# Patient Record
Sex: Female | Born: 1960 | Race: White | Hispanic: No | Marital: Married | Smoking: Never smoker
Health system: Southern US, Community
[De-identification: ages and names within clinical notes are randomized; demographics above are authoritative.]

## PROBLEM LIST (undated history)

## (undated) DIAGNOSIS — G8929 Other chronic pain: Secondary | ICD-10-CM

## (undated) DIAGNOSIS — I10 Essential (primary) hypertension: Secondary | ICD-10-CM

## (undated) DIAGNOSIS — J3801 Paralysis of vocal cords and larynx, unilateral: Secondary | ICD-10-CM

## (undated) DIAGNOSIS — J45998 Other asthma: Secondary | ICD-10-CM

## (undated) DIAGNOSIS — J302 Other seasonal allergic rhinitis: Secondary | ICD-10-CM

## (undated) DIAGNOSIS — N950 Postmenopausal bleeding: Secondary | ICD-10-CM

## (undated) DIAGNOSIS — J45909 Unspecified asthma, uncomplicated: Secondary | ICD-10-CM

## (undated) DIAGNOSIS — M1990 Unspecified osteoarthritis, unspecified site: Secondary | ICD-10-CM

## (undated) DIAGNOSIS — M542 Cervicalgia: Secondary | ICD-10-CM

## (undated) DIAGNOSIS — T7840XA Allergy, unspecified, initial encounter: Secondary | ICD-10-CM

## (undated) DIAGNOSIS — R002 Palpitations: Secondary | ICD-10-CM

## (undated) DIAGNOSIS — N840 Polyp of corpus uteri: Secondary | ICD-10-CM

## (undated) DIAGNOSIS — N390 Urinary tract infection, site not specified: Secondary | ICD-10-CM

## (undated) DIAGNOSIS — N9489 Other specified conditions associated with female genital organs and menstrual cycle: Secondary | ICD-10-CM

## (undated) HISTORY — PX: TONSILLECTOMY: SUR1361

## (undated) HISTORY — PX: APPENDECTOMY: SHX54

## (undated) HISTORY — DX: Allergy, unspecified, initial encounter: T78.40XA

## (undated) HISTORY — DX: Essential (primary) hypertension: I10

## (undated) HISTORY — PX: PLACEMENT OF BREAST IMPLANTS: SHX6334

## (undated) HISTORY — DX: Unspecified asthma, uncomplicated: J45.909

## (undated) HISTORY — DX: Paralysis of vocal cords and larynx, unilateral: J38.01

## (undated) HISTORY — PX: TONSILLECTOMY AND ADENOIDECTOMY: SUR1326

## (undated) HISTORY — DX: Urinary tract infection, site not specified: N39.0

---

## 1994-03-19 HISTORY — PX: APPENDECTOMY: SHX54

## 2001-03-19 HISTORY — PX: DILATION AND CURETTAGE OF UTERUS: SHX78

## 2007-04-07 DIAGNOSIS — M419 Scoliosis, unspecified: Secondary | ICD-10-CM | POA: Insufficient documentation

## 2007-04-10 DIAGNOSIS — R002 Palpitations: Secondary | ICD-10-CM | POA: Insufficient documentation

## 2007-04-17 DIAGNOSIS — F419 Anxiety disorder, unspecified: Secondary | ICD-10-CM | POA: Insufficient documentation

## 2008-07-06 ENCOUNTER — Encounter: Payer: Self-pay | Admitting: Family Medicine

## 2008-10-21 DIAGNOSIS — R768 Other specified abnormal immunological findings in serum: Secondary | ICD-10-CM | POA: Insufficient documentation

## 2008-10-21 DIAGNOSIS — J45909 Unspecified asthma, uncomplicated: Secondary | ICD-10-CM | POA: Insufficient documentation

## 2008-10-22 ENCOUNTER — Encounter: Payer: Self-pay | Admitting: Family Medicine

## 2009-04-06 ENCOUNTER — Ambulatory Visit: Payer: Self-pay | Admitting: Family Medicine

## 2009-04-06 DIAGNOSIS — J45909 Unspecified asthma, uncomplicated: Secondary | ICD-10-CM | POA: Insufficient documentation

## 2009-04-06 DIAGNOSIS — J4521 Mild intermittent asthma with (acute) exacerbation: Secondary | ICD-10-CM | POA: Insufficient documentation

## 2009-04-06 DIAGNOSIS — I10 Essential (primary) hypertension: Secondary | ICD-10-CM | POA: Insufficient documentation

## 2009-04-13 ENCOUNTER — Telehealth: Payer: Self-pay | Admitting: Family Medicine

## 2009-05-27 ENCOUNTER — Telehealth: Payer: Self-pay | Admitting: Family Medicine

## 2009-06-06 LAB — HM MAMMOGRAPHY: HM Mammogram: NEGATIVE

## 2009-06-24 ENCOUNTER — Ambulatory Visit: Payer: Self-pay | Admitting: Family Medicine

## 2009-06-29 ENCOUNTER — Telehealth: Payer: Self-pay | Admitting: Family Medicine

## 2009-07-15 ENCOUNTER — Telehealth: Payer: Self-pay | Admitting: Family Medicine

## 2009-11-25 DIAGNOSIS — E559 Vitamin D deficiency, unspecified: Secondary | ICD-10-CM | POA: Insufficient documentation

## 2010-04-18 NOTE — Progress Notes (Signed)
Summary: Medication correction to Metoprolol  Phone Note From Pharmacy   [facility]  [place] [address] Call For: Naidelin Gugliotta  Summary of Call: Pharmacy called to request corrected change to pt med.  Pt pt pharmacist, she has been on the Metoprolol 25 XR from Dr Daphine Deutscher, not the regular, she must have not clarified this at initial OV.  Correction made in EMR, permissin given to pharmacist to fill as pt requested, with the extended release, ER Initial call taken by: Sid Falcon LPN,  July 15, 2009 11:40 AM    New/Updated Medications: METOPROLOL SUCCINATE 25 MG XR24H-TAB (METOPROLOL SUCCINATE) 1 and 1/2 tabs daily

## 2010-04-18 NOTE — Progress Notes (Signed)
Summary: Tussionex  Phone Note Call from Patient   Caller: Patient Call For: Evelena Peat MD Summary of Call: Pt calls and states she has had an URI, but is better except for a cough at night.  Dr. Caryl Never gave her Tussionex a while ago and would like to have a Rx [facility]). [phone] Initial call taken by: Lynann Beaver CMA,  May 27, 2009 10:50 AM  Follow-up for Phone Call        may refill Tussionex one tsp by mouth q 12 hours as needed cough #120 ml with no refill. Follow-up by: Evelena Peat MD,  May 27, 2009 11:55 AM    New/Updated Medications: Sandria Senter ER 8-10 MG/5ML LQCR (CHLORPHENIRAMINE-HYDROCODONE) one teaspoon q 12 hours as needed cough. Prescriptions: TUSSIONEX PENNKINETIC ER 8-10 MG/5ML LQCR (CHLORPHENIRAMINE-HYDROCODONE) one teaspoon q 12 hours as needed cough.  #120 ml x 0   Entered by:   Lynann Beaver CMA   Authorized by:   Roderick Pee MD   Signed by:   Lynann Beaver CMA on 05/27/2009   Method used:   Telephoned to ...       [facility]  [place] [address] (retail)       [address]       [place], [place]  [address]       Ph: [phone] or [phone]       Fax: [phone]   RxID:   [id number]

## 2010-04-18 NOTE — Letter (Signed)
Summary: Test Results/[facility]  Test Results/[facility]   Imported By: Maryln Gottron 04/08/2009 13:06:58  _____________________________________________________________________  External Attachment:    Type:   Image     Comment:   External Document

## 2010-04-18 NOTE — Progress Notes (Signed)
Summary: BP lower and pulse rate higher  Phone Note Call from Patient   Caller: Patient Call For: Evelena Peat MD Summary of Call: Pt started Benicar 20 mg. 1/2 daily 5 days ago.  BP is down to 89/68, and her pulse rate has been as high as 111.  She feels more fatigued.  Is drinking lots of liquids, but concerned because her pulse rate on Metoprolol ran in  the 50s. [phone] Initial call taken by: Lynann Beaver CMA,  April 13, 2009 10:46 AM  Follow-up for Phone Call        spoke with pt.  She had low BP and some dizziness with low dose Benicar of 10 mg once daily.  Also, high pulse up to 110 off low dose metoprolol (off for 5 days)  Pt will stop Benicar and start back metoprolol 25 mg once daily and titirate to 1 and 1/2 tabs in 2-3 days and touch base in 2 weeks if BP still > 140/90 Follow-up by: Evelena Peat MD,  April 13, 2009 12:16 PM

## 2010-04-18 NOTE — Progress Notes (Signed)
Summary: med questions  Phone Note Call from Patient   Caller: Patient Call For: Evelena Peat MD Summary of Call: Pharmacy request to change Metoprolol from [facility] to the regular Metoprolol Tartrate,1 and 1/2 tab daily.  This is what she was previousely on per pt pharmacy.  I also spoke with pt whe is questioning if she should take th Prednisone for a lingering cough?  Her asthma symptoms are gone.  Will the prednisone help with cough or will she most likely have it until the pollen calms down. Initial call taken by: Sid Falcon LPN,  June 29, 2009 12:35 PM  Follow-up for Phone Call        I think she will be OK to change to Metoprolol tartrate.  Would try to avoid prednisone if no signif wheezing, given her prior intolerance. Follow-up by: Evelena Peat MD,  June 29, 2009 1:00 PM  Additional Follow-up for Phone Call Additional follow up Details #1::        Pt informed Additional Follow-up by: Sid Falcon LPN,  June 29, 2009 1:42 PM

## 2010-04-18 NOTE — Assessment & Plan Note (Signed)
Summary: ? asthma-wants shot//ccm   Vital Signs:  Patient profile:   50 year old female Menstrual status:  regular Temp:     98.6 degrees F oral BP sitting:   120 / 72  (left arm) Cuff size:   regular  Vitals Entered By: Sid Falcon LPN (June 24, 1608 3:41 PM) CC: Asthma   History of Present Illness: Patient has history of asthma. Pt relates onset over week ago of increased cough especially early morning. Increased nasal congestion. Wheezing off and on. History of asthma treated with Xopenex, Alvesco, and Singulair (sees allergist).  denies fever. Has responded to corticosteroid injection in  past. Requesting the same today. Nonsmoker.  Allergies: 1)  Ferrous Sulfate 2)  Penicillin V Potassium (Penicillin V Potassium)  Past History:  Past Medical History: Last updated: 04/06/2009 Asthma Hypertension Hay fever, allergies UTI  Past Surgical History: Last updated: 04/06/2009 Appendectomy 1996 Tonsillectomy 1972 PMH reviewed for relevance  Review of Systems       The patient complains of dyspnea on exertion.  The patient denies fever, peripheral edema, prolonged cough, headaches, and hemoptysis.    Physical Exam  General:  Well-developed,well-nourished,in no acute distress; alert,appropriate and cooperative throughout examination Ears:  External ear exam shows no significant lesions or deformities.  Otoscopic examination reveals clear canals, tympanic membranes are intact bilaterally without bulging, retraction, inflammation or discharge. Hearing is grossly normal bilaterally. Mouth:  Oral mucosa and oropharynx without lesions or exudates.  Teeth in good repair. Neck:  No deformities, masses, or tenderness noted. Lungs:  minimal wheezing. No retractions. No rales Heart:  Normal rate and regular rhythm. S1 and S2 normal without gallop, murmur, click, rub or other extra sounds.   Impression & Recommendations:  Problem # 1:  ASTHMA (ICD-493.90) cont usual meds and  depomedrol given.  Oral pred taper written to start if no better in a few days. Depo-Medrol 80 mg. Continue usual medications. Her updated medication list for this problem includes:    Xopenex Hfa 45 Mcg/act Aero (Levalbuterol tartrate) .Marland Kitchen... As needed    Alvesco 80 Mcg/act Aers (Ciclesonide) ..... One puff in am, one puff before bed    Singulair 10 Mg Tabs (Montelukast sodium) ..... One daily    Prednisone 10 Mg Tabs (Prednisone) .Marland Kitchen... Taper as follows: 4-4-4-3-3-2-2-1  Orders: Depo- Medrol 80mg  (J1040) Admin of Therapeutic Inj  intramuscular or subcutaneous (96045)  Problem # 2:  HYPERTENSION (ICD-401.9)  Her updated medication list for this problem includes:    Metoprolol Tartrate 25 Mg Tabs (Metoprolol tartrate) ..... One and 1/2 tabs daily  Complete Medication List: 1)  Alprazolam 0.5 Mg Tabs (Alprazolam) .... As needed 2)  Metoprolol Tartrate 25 Mg Tabs (Metoprolol tartrate) .... One and 1/2 tabs daily 3)  Tussionex Pennkinetic Er 8-10 Mg/63ml Lqcr (Chlorpheniramine-hydrocodone) .... One teaspoon q 12 hours as needed cough. 4)  Xopenex Hfa 45 Mcg/act Aero (Levalbuterol tartrate) .... As needed 5)  Alvesco 80 Mcg/act Aers (Ciclesonide) .... One puff in am, one puff before bed 6)  Singulair 10 Mg Tabs (Montelukast sodium) .... One daily 7)  Claritin 10 Mg Tabs (Loratadine) .... Once daily 8)  Prednisone 10 Mg Tabs (Prednisone) .... Taper as follows: 4-4-4-3-3-2-2-1  Patient Instructions: 1)  Followup promptly if you develop any fever or worsening respiratory symptoms Prescriptions: PREDNISONE 10 MG TABS (PREDNISONE) taper as follows: 4-4-4-3-3-2-2-1  #23 x 0   Entered and Authorized by:   Evelena Peat MD   Signed by:   Evelena Peat MD on  06/24/2009   Method used:   Print then Give to Patient   RxID:   [id number] METOPROLOL TARTRATE 25 MG TABS (METOPROLOL TARTRATE) one and 1/2 tabs daily  #135 x 3   Entered and Authorized by:   Evelena Peat MD   Signed by:    Evelena Peat MD on 06/24/2009   Method used:   [facility]        [facility]  [place] [address] [facility]       [address] [place] [address]       [place], [place]  [address]       Ph: [phone] or [phone]       Fax: [phone]   RxID:   [id number]    Medication Administration  Injection # 1:    Medication: Depo- Medrol 80mg     Diagnosis: ASTHMA (ICD-493.90)    Route: IM    Site: RUOQ gluteus    Exp Date: 01/28/2012    Lot #: [id number]    Mfr: Pharmacia    Patient tolerated injection without complications    Given by: Sid Falcon LPN (June 24, 7508 4:33 PM)  Orders Added: 1)  Depo- Medrol 80mg  [J1040] 2)  Admin of Therapeutic Inj  intramuscular or subcutaneous [96372] 3)  Est. Patient Level III [25852]

## 2010-04-18 NOTE — Assessment & Plan Note (Signed)
Summary: NEW PT EST (TXFR FROM [facility]) / HTN ISSUES // RS   Vital Signs:  Patient profile:   50 year old female Menstrual status:  regular LMP:     04/06/2009 Height:      64.75 inches Weight:      143 pounds BMI:     24.07 Temp:     98.6 degrees F oral Pulse rate:   60 / minute Pulse rhythm:   regular Resp:     12 per minute BP sitting:   130 / 90  (left arm) Cuff size:   regular  Vitals Entered By: Sid Falcon LPN (April 06, 2009 1:58 PM)  Serial Vital Signs/Assessments:  Time      Position  BP       Pulse  Resp  Temp     By                     140/80                         Evelena Peat MD  CC: New pt to establish, concerned about BP Is Patient Diabetic? No LMP (date): 04/06/2009     Menstrual Status regular Enter LMP: 04/06/2009 Last PAP Result normal   History of Present Illness: New patient to establish care. Patient has history of hypertension currently treated with low-dose metoprolol 25 mg daily. Also relates history of what sounds like mild intermittent asthma. She's had some seasonal allergies. Extensive allergy testing in past. Currently off all medications with no recent flareups.  blood pressure treated with beta blocker. Poor control at times. Occasional home readings as high as 170. Frequent systolic readings 140s and 90s diastolic.  Exercises fairly regularly.   Prior history of appendectomy and tonsillectomy. No other surgeries. Family history significant for hypertension several members.  Patient is married. [name] a Veterinary surgeon. 43-year-old autistic son. Drinks 2-3 glasses of wine per day. Nonsmoker.   Preventive Screening-Counseling & Management  Alcohol-Tobacco     Smoking Status: never  Caffeine-Diet-Exercise     Does Patient Exercise: yes  Allergies (verified): 1)  Ferrous Sulfate (Ferrous Sulfate) 2)  Penicillin V Potassium (Penicillin V Potassium)  Past History:  Family History: Last updated: 04/06/2009 Family History Hypertension,  parents  Social History: Last updated: 04/06/2009 Occupation:  Realtor Married Never Smoked Alcohol use-yes Regular exercise-yes  Risk Factors: Exercise: yes (04/06/2009)  Risk Factors: Smoking Status: never (04/06/2009)  Past Medical History: Asthma Hypertension Hay fever, allergies UTI  Past Surgical History: Appendectomy 1996 Tonsillectomy 1972  Family History: Family History Hypertension, parents  Social History: Occupation:  Veterinary surgeon Married Never Smoked Alcohol use-yes Regular exercise-yes Smoking Status:  never Occupation:  employed Does Patient Exercise:  yes  Review of Systems  The patient denies anorexia, fever, weight loss, weight gain, chest pain, syncope, dyspnea on exertion, peripheral edema, prolonged cough, headaches, hemoptysis, abdominal pain, melena, hematochezia, and severe indigestion/heartburn.    Physical Exam  General:  Well-developed,well-nourished,in no acute distress; alert,appropriate and cooperative throughout examination Neck:  No deformities, masses, or tenderness noted. Lungs:  Normal respiratory effort, chest expands symmetrically. Lungs are clear to auscultation, no crackles or wheezes. Heart:  Normal rate and regular rhythm. S1 and S2 normal without gallop, murmur, click, rub or other extra sounds. Pulses:  R radial normal and L radial normal.   Extremities:  no edema Skin:  Intact without suspicious lesions or rashes Cervical Nodes:  No lymphadenopathy noted Psych:  normally interactive, good eye contact, not anxious appearing, and not depressed appearing.     Impression & Recommendations:  Problem # 1:  HYPERTENSION (ICD-401.9) Change in therapy. given her history of asthma stop metoprolol and start Benicar 20 mg one half tablet daily  Her updated medication list for this problem includes:    Benicar 20 Mg Tabs (Olmesartan medoxomil) ..... One half by mouth once daily  Problem # 2:  ASTHMA (ICD-493.90) mild and  intermittent by history.    Complete Medication List: 1)  Alprazolam 0.5 Mg Tabs (Alprazolam) .... As needed 2)  Benicar 20 Mg Tabs (Olmesartan medoxomil) .... One half by mouth once daily  Patient Instructions: 1)  Continue regular aerobic exercise. 2)  Limit wine to no more than 12 ounces per day. 3)  stop metoprolol. 4)  Start Benicar 20 mg one half tablet daily. 5)  Please schedule a follow-up appointment in 1 month.   Preventive Care Screening  Mammogram:    Date:  01/17/2009    Results:  normal   Pap Smear:    Date:  10/17/2008    Results:  normal

## 2010-08-10 ENCOUNTER — Other Ambulatory Visit: Payer: Self-pay | Admitting: Family Medicine

## 2010-11-10 ENCOUNTER — Other Ambulatory Visit: Payer: Self-pay | Admitting: Family Medicine

## 2010-12-15 ENCOUNTER — Other Ambulatory Visit: Payer: Self-pay | Admitting: Family Medicine

## 2011-01-23 ENCOUNTER — Other Ambulatory Visit: Payer: Self-pay | Admitting: Family Medicine

## 2011-03-02 ENCOUNTER — Other Ambulatory Visit (INDEPENDENT_AMBULATORY_CARE_PROVIDER_SITE_OTHER): Payer: BC Managed Care – PPO

## 2011-03-02 DIAGNOSIS — Z0000 Encounter for general adult medical examination without abnormal findings: Secondary | ICD-10-CM

## 2011-03-02 LAB — LIPID PANEL
Cholesterol: 208 mg/dL — ABNORMAL HIGH (ref 0–200)
HDL: 101.9 mg/dL (ref >39.00)
Total CHOL/HDL Ratio: 2
Triglycerides: 91 mg/dL (ref 0.0–149.0)
VLDL: 18.2 mg/dL (ref 0.0–40.0)

## 2011-03-02 LAB — CBC WITH DIFFERENTIAL/PLATELET
Basophils Absolute: 0 10*3/uL (ref 0.0–0.1)
Eosinophils Relative: 3.3 % (ref 0.0–5.0)
MCV: 94.6 fl (ref 78.0–100.0)
Monocytes Absolute: 0.5 10*3/uL (ref 0.1–1.0)
Neutrophils Relative %: 68.9 % (ref 43.0–77.0)
Platelets: 255 10*3/uL (ref 150.0–400.0)
RDW: 13 % (ref 11.5–14.6)
WBC: 7 10*3/uL (ref 4.5–10.5)

## 2011-03-02 LAB — HEPATIC FUNCTION PANEL
ALT: 16 U/L (ref 0–35)
AST: 25 U/L (ref 0–37)
Albumin: 4.3 g/dL (ref 3.5–5.2)
Alkaline Phosphatase: 46 U/L (ref 39–117)
Bilirubin, Direct: 0 mg/dL (ref 0.0–0.3)
Total Bilirubin: 0.6 mg/dL (ref 0.3–1.2)
Total Protein: 7.4 g/dL (ref 6.0–8.3)

## 2011-03-02 LAB — POCT URINALYSIS DIPSTICK
Bilirubin, UA: NEGATIVE
Blood, UA: NEGATIVE
Nitrite, UA: NEGATIVE
Spec Grav, UA: 1.015
pH, UA: 6.5

## 2011-03-02 LAB — BASIC METABOLIC PANEL
BUN: 15 mg/dL (ref 6–23)
Creatinine, Ser: 0.9 mg/dL (ref 0.4–1.2)
GFR: 68.28 mL/min (ref >60.00)
Glucose, Bld: 84 mg/dL (ref 70–99)

## 2011-03-02 LAB — LDL CHOLESTEROL, DIRECT: Direct LDL: 87.1 mg/dL

## 2011-03-09 ENCOUNTER — Encounter: Payer: Self-pay | Admitting: Family Medicine

## 2011-03-09 ENCOUNTER — Ambulatory Visit (INDEPENDENT_AMBULATORY_CARE_PROVIDER_SITE_OTHER): Payer: BC Managed Care – PPO | Admitting: Family Medicine

## 2011-03-09 VITALS — BP 120/78 | HR 72 | Temp 98.5°F | Resp 12 | Ht 65.0 in | Wt 147.0 lb

## 2011-03-09 DIAGNOSIS — I10 Essential (primary) hypertension: Secondary | ICD-10-CM

## 2011-03-09 DIAGNOSIS — Z0000 Encounter for general adult medical examination without abnormal findings: Secondary | ICD-10-CM

## 2011-03-09 DIAGNOSIS — R062 Wheezing: Secondary | ICD-10-CM

## 2011-03-09 MED ORDER — METOPROLOL SUCCINATE ER 25 MG PO TB24: 25.0000 mg | ORAL_TABLET | Freq: Every day | ORAL | Status: DC

## 2011-03-09 MED ORDER — MOMETASONE FUROATE 50 MCG/ACT NA SUSP: 2.0000 | Freq: Every day | NASAL | Status: DC

## 2011-03-09 MED ORDER — AZITHROMYCIN 250 MG PO TABS: ORAL_TABLET | ORAL | Status: AC

## 2011-03-09 MED ORDER — ALPRAZOLAM 0.5 MG PO TABS: 0.5000 mg | ORAL_TABLET | Freq: Three times a day (TID) | ORAL | Status: DC | PRN

## 2011-03-09 MED ORDER — METHYLPREDNISOLONE ACETATE 80 MG/ML IJ SUSP
80.0000 mg | Freq: Once | INTRAMUSCULAR | Status: AC
  Administered 2011-03-09: 80 mg via INTRAMUSCULAR

## 2011-03-09 NOTE — Progress Notes (Signed)
  Subjective:    Patient ID: Lori Love, female    DOB: January 18, 1961, 50 y.o.   MRN: [id number]  HPI  Patient for complete physical. She sees gynecologist regularly. Recent mammogram normal. Recent Pap smears normal. She exercises regularly. She has history of perennial allergies and also asthma which appears to be more mild intermittent. Recent flareup with Christmas tree. Frequent cough and wheeze over the past couple of weeks. Previously has received Depo-Medrol injection which helped. Requesting the same.  No fever or chills.  She has Singulair and Alvesco but does not take regularly.  Hypertension treated with metoprolol 25 mg daily which works well. Strong family history of hypertension both parents.  Past Medical History  Diagnosis Date  . HYPERTENSION 04/06/2009  . ASTHMA 04/06/2009  . Allergy   . UTI (urinary tract infection)    Past Surgical History  Procedure Date  . Appendectomy   . Tonsillectomy     reports that she has never smoked. She does not have any smokeless tobacco history on file. Her alcohol and drug histories not on file. family history includes Hypertension in her father and mother. Allergies  Allergen Reactions  . Ferrous Sulfate     REACTION: hives  . Penicillins     REACTION: hives      Review of Systems  Constitutional: Negative for fever, activity change, appetite change, fatigue and unexpected weight change.  HENT: Negative for hearing loss, ear pain, sore throat and trouble swallowing.   Eyes: Negative for visual disturbance.  Respiratory: Positive for cough and wheezing. Negative for shortness of breath.   Cardiovascular: Negative for chest pain and palpitations.  Gastrointestinal: Negative for abdominal pain, diarrhea, constipation and blood in stool.  Genitourinary: Negative for dysuria and hematuria.  Musculoskeletal: Negative for myalgias, back pain and arthralgias.  Skin: Negative for rash.  Neurological: Negative for dizziness, syncope  and headaches.  Hematological: Negative for adenopathy.  Psychiatric/Behavioral: Negative for confusion and dysphoric mood.       Objective:   Physical Exam  Constitutional: [name] oriented to person, place, and time. She appears well-developed and well-nourished.  HENT:  Head: Normocephalic and atraumatic.  Eyes: EOM are normal. Pupils are equal, round, and reactive to light.  Neck: Normal range of motion. Neck supple. No thyromegaly present.  Cardiovascular: Normal rate, regular rhythm and normal heart sounds.   No murmur heard. Pulmonary/Chest: Breath sounds normal. No respiratory distress. She has no wheezes. She has no rales.  Abdominal: Soft. Bowel sounds are normal. She exhibits no distension and no mass. There is no tenderness. There is no rebound and no guarding.  Musculoskeletal: Normal range of motion. She exhibits no edema.  Lymphadenopathy:    She has no cervical adenopathy.  Neurological: [name] alert and oriented to person, place, and time. She displays normal reflexes. No cranial nerve deficit.  Skin: No rash noted.  Psychiatric: She has a normal mood and affect. Her behavior is normal. Judgment and thought content normal.          Assessment & Plan:  #1 health maintenance. Continue GYN followup. Patient declines flu vaccine. Continue regular exercise. Labs reviewed with patient and all favorable. #2 history of asthma. Current exacerbation probably related to exposure to Christmas tree. Depo-Medrol 80 mg IM given and get back on her steroid inhaler for the next couple of weeks at least #3 hypertension stable refill medication for one year

## 2011-06-28 ENCOUNTER — Ambulatory Visit (INDEPENDENT_AMBULATORY_CARE_PROVIDER_SITE_OTHER): Payer: BC Managed Care – PPO | Admitting: Family Medicine

## 2011-06-28 ENCOUNTER — Encounter: Payer: Self-pay | Admitting: Family Medicine

## 2011-06-28 DIAGNOSIS — D179 Benign lipomatous neoplasm, unspecified: Secondary | ICD-10-CM

## 2011-06-28 NOTE — Progress Notes (Signed)
  Subjective:    Patient ID: Lori Love, female    DOB: Aug 12, 1960, 51 y.o.   MRN: [id number]  HPI  Nonpainful lump on back.  Just noted recently. Location is right upper thoracic region. Not noted any skin changes. No appetite or weight changes. No adenopathy. This was noted incidentally by friend. No adenopathy.   Review of Systems  Constitutional: Negative for appetite change and unexpected weight change.  Hematological: Negative for adenopathy.       Objective:   Physical Exam  Constitutional: She appears well-developed and well-nourished.  Cardiovascular: Normal rate and regular rhythm.   Pulmonary/Chest: Effort normal and breath sounds normal. No respiratory distress. She has no wheezes. She has no rales.  Skin:       Lipomatous mass about one and1/2 cm diameter right upper thoracic region. Nontender. No skin changes          Assessment & Plan:  Benign lipoma right upper back. Reassurance given

## 2011-06-28 NOTE — Patient Instructions (Signed)
Lipoma A lipoma is a noncancerous (benign) tumor composed of fat cells. They are usually found under the skin (subcutaneous). A lipoma may occur in any tissue of the body that contains fat. Common areas for lipomas to appear include the back, shoulders, buttocks, and thighs. Lipomas are a very common soft tissue growth. They are soft and grow slowly. Most problems caused by a lipoma depend on where it is growing. DIAGNOSIS  A lipoma can be diagnosed with a physical exam. These tumors rarely become cancerous, but radiographic studies can help determine this for certain. Studies used may include:  Computerized X-ray scans (CT or CAT scan).   Computerized magnetic scans (MRI).  TREATMENT  Small lipomas that are not causing problems may be watched. If a lipoma continues to enlarge or causes problems, removal is often the best treatment. Lipomas can also be removed to improve appearance. Surgery is done to remove the fatty cells and the surrounding capsule. Most often, this is done with medicine that numbs the area (local anesthetic). The removed tissue is examined under a microscope to make sure it is not cancerous. Keep all follow-up appointments with your caregiver. SEEK MEDICAL CARE IF:   The lipoma becomes larger or hard.   The lipoma becomes painful, red, or increasingly swollen. These could be signs of infection or a more serious condition.  Document Released: 02/23/2002 Document Revised: 02/22/2011 Document Reviewed: 08/05/2009 Exit[employer] Patient Information 2012 [employer]. 

## 2011-07-04 ENCOUNTER — Other Ambulatory Visit: Payer: Self-pay | Admitting: *Deleted

## 2011-07-04 MED ORDER — MONTELUKAST SODIUM 10 MG PO TABS: 10.0000 mg | ORAL_TABLET | Freq: Every day | ORAL | Status: DC

## 2011-10-17 ENCOUNTER — Telehealth: Payer: Self-pay | Admitting: Family Medicine

## 2011-10-17 MED ORDER — NITROFURANTOIN MONOHYD MACRO 100 MG PO CAPS: 100.0000 mg | ORAL_CAPSULE | Freq: Two times a day (BID) | ORAL | Status: AC

## 2011-10-17 NOTE — Telephone Encounter (Signed)
Caller: Lori Love/Patient; PCP: Evelena Peat; CB#: [phone]; ; ; Call regarding Urinary Pain;  Lori Love developed urinary pain, urgency and frequency on 10/16/11. Afebrile. Cloudy urine, denies blood.  Denies flank or low back pain. Lori Love is requesting an abx to be called in because her son is having surgery on his hands today and she cannot come to the office.  Informed abx's cannot be called in and she will need to be seen in the office.  Utilized Urinary Symptoms Guideline.  See PCP within 24 hrs d/t "has one or more urinary tract symptoms and has not been previously evaluated".  Lori Love insists that MD be asked about [facility] [phone] d/t current situation with her son having surgery today.  Please f/u with Lori Love.

## 2011-10-17 NOTE — Telephone Encounter (Signed)
Caller: Lori Love/Patient; Phone Number: [phone]; Message from caller: Calling for status of medication for UTI to be called in. Pt has son going in for surgery today and would like to know when to pick up medication.

## 2011-10-17 NOTE — Telephone Encounter (Signed)
Generally, should be seen but given circumstances, Macrobid one bid for 3 days and needs office f/u if no prompt improvement.

## 2011-10-17 NOTE — Telephone Encounter (Signed)
Please advise 

## 2011-10-17 NOTE — Telephone Encounter (Signed)
Rx sent, pt informed. 

## 2011-11-05 ENCOUNTER — Other Ambulatory Visit (INDEPENDENT_AMBULATORY_CARE_PROVIDER_SITE_OTHER): Payer: BC Managed Care – PPO

## 2011-11-05 DIAGNOSIS — N390 Urinary tract infection, site not specified: Secondary | ICD-10-CM

## 2011-11-05 LAB — POCT URINALYSIS DIPSTICK
Bilirubin, UA: NEGATIVE
Glucose, UA: NEGATIVE
Ketones, UA: NEGATIVE
Leukocytes, UA: NEGATIVE

## 2011-11-22 NOTE — Progress Notes (Signed)
Quick Note:  Nana and Gwen, please help [place] figure out who ordered the UA. Thanks, Johnpaul Gillentine ______

## 2012-01-04 ENCOUNTER — Other Ambulatory Visit: Payer: Self-pay | Admitting: *Deleted

## 2012-01-04 MED ORDER — ALPRAZOLAM 0.5 MG PO TABS: ORAL_TABLET | ORAL | Status: DC

## 2012-01-21 ENCOUNTER — Ambulatory Visit (INDEPENDENT_AMBULATORY_CARE_PROVIDER_SITE_OTHER): Payer: BC Managed Care – PPO | Admitting: Family Medicine

## 2012-01-21 ENCOUNTER — Encounter: Payer: Self-pay | Admitting: Family Medicine

## 2012-01-21 VITALS — BP 112/80 | HR 63 | Temp 98.3°F | Wt 150.0 lb

## 2012-01-21 DIAGNOSIS — J309 Allergic rhinitis, unspecified: Secondary | ICD-10-CM

## 2012-01-21 DIAGNOSIS — J45909 Unspecified asthma, uncomplicated: Secondary | ICD-10-CM

## 2012-01-21 MED ORDER — CICLESONIDE 80 MCG/ACT IN AERS: 1.0000 | INHALATION_SPRAY | Freq: Two times a day (BID) | RESPIRATORY_TRACT | Status: DC

## 2012-01-21 MED ORDER — MOMETASONE FUROATE 50 MCG/ACT NA SUSP: 2.0000 | Freq: Every day | NASAL | Status: DC

## 2012-01-21 NOTE — Progress Notes (Signed)
Chief Complaint  Patient presents with  . Asthma    HPI:  Acute visit for Asthma: -meds include singulair, nasonex, xopenex prn on med list - but she is not taking singulair or nasonex - she is taking zyrtec -symptoms: PND and drainag ein throat is bad, since yesterday has had cough - triggered by being around smoking, cough last night and xopenex didn't help last night, but has needed albuterol 4-5x per week for last several weeks for cough and drainage -uses albuterol about 1x per month normally -reports has use alvesco in the past when had these symptoms and want this medication prescribed today -last prednisone or hospitalization: years ago -last PFT: last year  ROS: See pertinent positives and negatives per HPI.  Past Medical History  Diagnosis Date  . HYPERTENSION 04/06/2009  . ASTHMA 04/06/2009  . Allergy   . UTI (urinary tract infection)     Family History  Problem Relation Age of Onset  . Hypertension Mother   . Hypertension Father     History   Social History  . Marital Status: Married    Spouse Name: N/A    Number of Children: N/A  . Years of Education: N/A   Social History Main Topics  . Smoking status: Never Smoker   . Smokeless tobacco: None  . Alcohol Use: None  . Drug Use: None  . Sexually Active: None   Other Topics Concern  . None   Social History Narrative  . None    Current outpatient prescriptions:ALPRAZolam (XANAX) 0.5 MG tablet, 1/2 tab as needed for sleep, Disp: 30 tablet, Rfl: 1;  levalbuterol (XOPENEX HFA) 45 MCG/ACT inhaler, Inhale 1-2 puffs into the lungs every 4 (four) hours as needed.  , Disp: , Rfl: ;  metoprolol succinate (TOPROL-XL) 25 MG 24 hr tablet, Take 1 tablet (25 mg total) by mouth daily., Disp: 90 tablet, Rfl: 3 montelukast (SINGULAIR) 10 MG tablet, Take 1 tablet (10 mg total) by mouth at bedtime., Disp: 30 tablet, Rfl: 5;  valACYclovir (VALTREX) 500 MG tablet, As needed, Disp: , Rfl: ;  ciclesonide (ALVESCO) 80 MCG/ACT  inhaler, Inhale 1 puff into the lungs 2 (two) times daily., Disp: 1 Inhaler, Rfl: 0;  mometasone (NASONEX) 50 MCG/ACT nasal spray, Place 2 sprays into the nose daily., Disp: 17 g, Rfl: 3 [DISCONTINUED] mometasone (NASONEX) 50 MCG/ACT nasal spray, Place 2 sprays into the nose daily., Disp: 17 g, Rfl: 12  EXAM:  Filed Vitals:   01/21/12 1348  BP: 112/80  Pulse: 63  Temp: 98.3 F (36.8 C)    There is no height on file to calculate BMI.  GENERAL: vitals reviewed and listed above, alert, oriented, appears well hydrated and in no acute distress  HEENT: atraumatic, conjunttiva clear, no obvious abnormalities on inspection of external nose and ears. normal appearance of ear canals and TMs, clear nasal congestion, mild post oropharyngeal erythema with PND, no tonsillar edema or exudate, no sinus TTP  NECK: no obvious masses on inspection  LUNGS: clear to auscultation bilaterally, no wheezes, rales or rhonchi, good air movement  CV: HRRR, no peripheral edema  MS: moves all extremities without noticeable abnormality  PSYCH: pleasant and cooperative, no obvious depression or anxiety  ASSESSMENT AND PLAN:  Discussed the following assessment and plan:  1. Allergic rhinitis  mometasone (NASONEX) 50 MCG/ACT nasal spray  2. ASTHMA  ciclesonide (ALVESCO) 80 MCG/ACT inhaler   -symptoms may more likely be related to uncontrolled AR with PND per exam and advised dialy allegro,  zyrtec or claritin with nasonex  -will do low dose ICS short term as well given albuterol use over last several weeks -pt to follow up with PCP in one month or sooner if worsening or not improving  There are no Patient Instructions on file for this visit.   Kriste Basque R.

## 2012-01-21 NOTE — Patient Instructions (Addendum)
-  uses nasonex every day  -use alvesco daily as instructed until follow up with your doctor, use your xopenex as needed  -follow up with your doctor in 1 month or sooner if symptoms worsen or do not improve

## 2012-01-23 ENCOUNTER — Telehealth: Payer: Self-pay | Admitting: Family Medicine

## 2012-01-23 NOTE — Telephone Encounter (Signed)
Pt called req mometasone (NASONEX) 50 MCG/ACT nasal spray. Pt was told by [facility] that pts insurance is needing confirmation from pcp in order to get this med. Pls call asap. Pt needs this med for asthma.

## 2012-01-23 NOTE — Telephone Encounter (Signed)
Spoke with [place] and she just received the prior authorization.  Called and left a detailed message on pt's personalized voicemail that piror authorization has been received is currently being worked on.

## 2012-01-23 NOTE — Telephone Encounter (Signed)
Lori Love,  I spoke with the pt regarding her Nasonex prior auth. I have submitted it, but I wanted to have this in her chart:  She tried Flonase (gave her nosebleeds), Veramyst, and Nasacort (both of these hurt and "tore up" the membranes inside her nostrils.  Also, she states she needs a refill or [facility] [address] on the levalbuterol [facility] HFA) 45 MCG/ACT inhaler She couldn't remember if she told Dr. Selena Batten that. Thanks!

## 2012-01-24 MED ORDER — LEVALBUTEROL TARTRATE 45 MCG/ACT IN AERO: 1.0000 | INHALATION_SPRAY | RESPIRATORY_TRACT | Status: DC | PRN

## 2012-01-24 NOTE — Telephone Encounter (Signed)
Per Dr. Selena Batten ok to refill once; if pt is having to use this more often pt needs to follow up with Dr. Caryl Never.

## 2012-01-24 NOTE — Telephone Encounter (Signed)
Rx sent to pharmacy and pt is aware of Dr. Elmyra Ricks recommendations.

## 2012-01-24 NOTE — Telephone Encounter (Signed)
Nasonex approved. Approval sent to pharmacy

## 2012-01-24 NOTE — Telephone Encounter (Signed)
See duplicate note. 

## 2012-03-14 ENCOUNTER — Other Ambulatory Visit (INDEPENDENT_AMBULATORY_CARE_PROVIDER_SITE_OTHER): Payer: BC Managed Care – PPO

## 2012-03-14 DIAGNOSIS — Z0000 Encounter for general adult medical examination without abnormal findings: Secondary | ICD-10-CM

## 2012-03-14 LAB — CBC WITH DIFFERENTIAL/PLATELET
Eosinophils Relative: 2.8 % (ref 0.0–5.0)
HCT: 37 % (ref 36.0–46.0)
Hemoglobin: 12.4 g/dL (ref 12.0–15.0)
Lymphs Abs: 1.5 10*3/uL (ref 0.7–4.0)
Monocytes Relative: 7.4 % (ref 3.0–12.0)
Neutro Abs: 4.1 10*3/uL (ref 1.4–7.7)
Platelets: 251 10*3/uL (ref 150.0–400.0)
WBC: 6.3 10*3/uL (ref 4.5–10.5)

## 2012-03-14 LAB — LIPID PANEL
Total CHOL/HDL Ratio: 2
VLDL: 16.6 mg/dL (ref 0.0–40.0)

## 2012-03-14 LAB — BASIC METABOLIC PANEL
GFR: 78.62 mL/min (ref >60.00)
Potassium: 5.4 mEq/L — ABNORMAL HIGH (ref 3.5–5.1)
Sodium: 136 mEq/L (ref 135–145)

## 2012-03-14 LAB — HEPATIC FUNCTION PANEL
ALT: 12 U/L (ref 0–35)
AST: 20 U/L (ref 0–37)
Total Bilirubin: 0.7 mg/dL (ref 0.3–1.2)

## 2012-03-14 LAB — TSH: TSH: 1.38 u[IU]/mL (ref 0.35–5.50)

## 2012-03-20 ENCOUNTER — Encounter: Payer: BC Managed Care – PPO | Admitting: Family Medicine

## 2012-03-25 ENCOUNTER — Encounter: Payer: Self-pay | Admitting: Family Medicine

## 2012-03-25 ENCOUNTER — Ambulatory Visit (INDEPENDENT_AMBULATORY_CARE_PROVIDER_SITE_OTHER): Payer: BC Managed Care – PPO | Admitting: Family Medicine

## 2012-03-25 VITALS — BP 130/80 | HR 72 | Temp 99.0°F | Resp 12 | Ht 64.75 in | Wt 146.0 lb

## 2012-03-25 DIAGNOSIS — Z299 Encounter for prophylactic measures, unspecified: Secondary | ICD-10-CM

## 2012-03-25 MED ORDER — METOPROLOL SUCCINATE ER 25 MG PO TB24: ORAL_TABLET | ORAL | Status: DC

## 2012-03-25 MED ORDER — TETANUS-DIPHTH-ACELL PERTUSSIS 5-2.5-18.5 LF-MCG/0.5 IM SUSP: 0.5000 mL | Freq: Once | INTRAMUSCULAR | Status: DC

## 2012-03-25 NOTE — Progress Notes (Signed)
  Subjective:    Patient ID: Lori Love, female    DOB: 18-Nov-1960, 52 y.o.   MRN: [id number]  HPI Patient here for complete physical. She continues to see gynecologist in [place]. She has history of hypertension and mild asthma. Asthma very well controlled. She's had palpitations in the past and responded well to beta blocker. We discussed at one point getting off beta blocker because of asthma but she's not tolerated this symptomatically. Exercises regularly. Last tetanus greater than 10 years ago. Declines flu vaccine. [name] getting regular mammograms with gynecologist and reports these been normal.  Recently she had mild elevation of blood pressure and titrated her metoprolol 25 mg to one and one half tablets daily and blood pressure has been stable since then.  Nonsmoker. Both parents with hypertension history. No CAD history.  Past Medical History  Diagnosis Date  . HYPERTENSION 04/06/2009  . ASTHMA 04/06/2009  . Allergy   . UTI (urinary tract infection)    Past Surgical History  Procedure Date  . Appendectomy   . Tonsillectomy     reports that she has never smoked. She does not have any smokeless tobacco history on file. Her alcohol and drug histories not on file. family history includes Hypertension in her father and mother. Allergies  Allergen Reactions  . Ferrous Sulfate     REACTION: hives  . Penicillins     REACTION: hives      Review of Systems  Constitutional: Negative for fever, activity change, appetite change, fatigue and unexpected weight change.  HENT: Negative for hearing loss, ear pain, sore throat and trouble swallowing.   Eyes: Negative for visual disturbance.  Respiratory: Negative for cough and shortness of breath.   Cardiovascular: Negative for chest pain and palpitations.  Gastrointestinal: Negative for abdominal pain, diarrhea, constipation and blood in stool.  Genitourinary: Negative for dysuria and hematuria.  Musculoskeletal: Positive for  arthralgias (Left CMC joint). Negative for myalgias and back pain.  Skin: Negative for rash.  Neurological: Negative for dizziness, syncope and headaches.  Hematological: Negative for adenopathy.  Psychiatric/Behavioral: Negative for confusion and dysphoric mood.       Objective:   Physical Exam  Constitutional: [name] oriented to person, place, and time. She appears well-developed and well-nourished.  HENT:  Head: Normocephalic and atraumatic.  Eyes: EOM are normal. Pupils are equal, round, and reactive to light.  Neck: Normal range of motion. Neck supple. No thyromegaly present.  Cardiovascular: Normal rate, regular rhythm and normal heart sounds.   No murmur heard. Pulmonary/Chest: Breath sounds normal. No respiratory distress. She has no wheezes. She has no rales.  Abdominal: Soft. Bowel sounds are normal. She exhibits no distension and no mass. There is no tenderness. There is no rebound and no guarding.  Musculoskeletal: Normal range of motion. She exhibits no edema.  Lymphadenopathy:    She has no cervical adenopathy.  Neurological: [name] alert and oriented to person, place, and time. She displays normal reflexes. No cranial nerve deficit.  Skin: No rash noted.  Psychiatric: She has a normal mood and affect. Her behavior is normal. Judgment and thought content normal.          Assessment & Plan:  Complete physical. Tetanus booster given. Patient declines flu vaccine. Labs reviewed with patient all favorable. Refill metoprolol. 25 mg one and one half tablets daily

## 2012-04-03 ENCOUNTER — Other Ambulatory Visit: Payer: Self-pay | Admitting: Family Medicine

## 2012-06-05 ENCOUNTER — Other Ambulatory Visit: Payer: Self-pay | Admitting: Family Medicine

## 2012-06-06 ENCOUNTER — Other Ambulatory Visit: Payer: Self-pay | Admitting: *Deleted

## 2012-06-06 MED ORDER — CICLESONIDE 80 MCG/ACT IN AERS: 1.0000 | INHALATION_SPRAY | Freq: Two times a day (BID) | RESPIRATORY_TRACT | Status: DC

## 2012-06-06 NOTE — Telephone Encounter (Signed)
Refill once 

## 2012-06-06 NOTE — Telephone Encounter (Signed)
Alprazolam 1/2 tab at HS prn last filled 01-04-12, #30 with 1 refills

## 2012-06-09 NOTE — Telephone Encounter (Signed)
Pt following up on request for: ALPRAZolam (XANAX) 0.5 MG tablet ciclesonide (ALVESCO) 80 MCG/ACT inhaler XOPENX montelukast (SINGULAIR) 10 MG tablet  X[facility] /(Cl[facility]) [facility] [place] [place]

## 2012-06-10 MED ORDER — CICLESONIDE 80 MCG/ACT IN AERS: 1.0000 | INHALATION_SPRAY | Freq: Two times a day (BID) | RESPIRATORY_TRACT | Status: DC

## 2012-06-10 MED ORDER — MONTELUKAST SODIUM 10 MG PO TABS: 10.0000 mg | ORAL_TABLET | ORAL | Status: DC | PRN

## 2012-06-10 MED ORDER — LEVALBUTEROL TARTRATE 45 MCG/ACT IN AERO: 1.0000 | INHALATION_SPRAY | RESPIRATORY_TRACT | Status: DC | PRN

## 2012-06-10 NOTE — Telephone Encounter (Signed)
Rx last sent 01/04/12 #[id number] rf. Pt last seen 01/21/12. Pls advise.

## 2012-06-10 NOTE — Telephone Encounter (Signed)
May refill allergy/respiratory meds listed for one year.

## 2012-06-10 NOTE — Telephone Encounter (Signed)
Allegery rx sen to pharmacy.

## 2012-06-10 NOTE — Addendum Note (Signed)
Addended by: Azucena Freed on: 06/10/2012 03:46 PM   Modules accepted: Orders

## 2012-07-03 ENCOUNTER — Ambulatory Visit (INDEPENDENT_AMBULATORY_CARE_PROVIDER_SITE_OTHER): Payer: BC Managed Care – PPO | Admitting: Family Medicine

## 2012-07-03 ENCOUNTER — Encounter: Payer: Self-pay | Admitting: Family Medicine

## 2012-07-03 VITALS — BP 110/70 | HR 73 | Temp 98.9°F | Wt 147.0 lb

## 2012-07-03 DIAGNOSIS — H5789 Other specified disorders of eye and adnexa: Secondary | ICD-10-CM

## 2012-07-03 NOTE — Progress Notes (Signed)
Chief Complaint  Patient presents with  . Conjunctivitis    painful and itchy some, swelling and oozing     HPI:  Acute visit for ? Pink Eye: -started 2 days ago -pain in L eye, redness, draining, swelling of eyelid and hurts around orbit, HA, light sensitivity -denies: fevers, chills, vomiting -has tried bepotastine and polymyxin yesterday  ROS: See pertinent positives and negatives per HPI.  Past Medical History  Diagnosis Date  . HYPERTENSION 04/06/2009  . ASTHMA 04/06/2009  . Allergy   . UTI (urinary tract infection)     Family History  Problem Relation Age of Onset  . Hypertension Mother   . Hypertension Father     History   Social History  . Marital Status: Married    Spouse Name: N/A    Number of Children: N/A  . Years of Education: N/A   Social History Main Topics  . Smoking status: Never Smoker   . Smokeless tobacco: None  . Alcohol Use: None  . Drug Use: None  . Sexually Active: None   Other Topics Concern  . None   Social History Narrative  . None    Current outpatient prescriptions:ALPRAZolam (XANAX) 0.5 MG tablet, TAKE 1/2 TABLET AS NEEDED FOR SLEEP, Disp: 30 tablet, Rfl: 0;  ciclesonide (ALVESCO) 80 MCG/ACT inhaler, Inhale 1 puff into the lungs 2 (two) times daily. As needed for seasonal allergies, Disp: 1 Inhaler, Rfl: 2;  ciclesonide (ALVESCO) 80 MCG/ACT inhaler, Inhale 1 puff into the lungs 2 (two) times daily. As needed for seasonal allergies, Disp: 1 Inhaler, Rfl: 11 levalbuterol (XOPENEX HFA) 45 MCG/ACT inhaler, Inhale 1-2 puffs into the lungs every 4 (four) hours as needed., Disp: 1 Inhaler, Rfl: 11;  metoprolol succinate (TOPROL-XL) 25 MG 24 hr tablet, Take one and one half tablet daily, Disp: 45 tablet, Rfl: 11;  metoprolol succinate (TOPROL-XL) 25 MG 24 hr tablet, TAKE 1 TABLET BY MOUTH EVERY DAY, Disp: 90 tablet, Rfl: 3 mometasone (NASONEX) 50 MCG/ACT nasal spray, Place 2 sprays into the nose as needed., Disp: , Rfl: ;  montelukast  (SINGULAIR) 10 MG tablet, Take 1 tablet (10 mg total) by mouth as needed., Disp: 30 tablet, Rfl: 11;  valACYclovir (VALTREX) 500 MG tablet, As needed, Disp: , Rfl:   EXAM:  Filed Vitals:   07/03/12 0905  BP: 110/70  Pulse: 73  Temp: 98.9 F (37.2 C)    Body mass index is 24.64 kg/(m^2).  GENERAL: vitals reviewed and listed above, alert, oriented, appears well hydrated and in no acute distress  HEENT: atraumatic, conjunttiva erythematous, eyelid and periorbital edema and erythem, clear drainage, visual acuity grossly intact, PERRLA, normal extraocular movements, no obvious abnormalities on inspection of external nose and ears,  NECK: no obvious masses on inspection  LUNGS: clear to auscultation bilaterally, no wheezes, rales or rhonchi, good air movement  CV: HRRR, no peripheral edema  MS: moves all extremities without noticeable abnormality  PSYCH: pleasant and cooperative, no obvious depression or anxiety  ASSESSMENT AND PLAN:  Discussed the following assessment and plan:  Red eye - Plan: Ambulatory referral to Ophthalmology  -query allergic rx (possibly to polymixin) versus infectious process - refer for further eval by optho given degree of symptoms -Patient advised to return or notify a doctor immediately if symptoms worsen or persist or new concerns arise.  There are no Patient Instructions on file for this visit.   Kriste Basque R.

## 2012-07-06 ENCOUNTER — Emergency Department (HOSPITAL_COMMUNITY)
Admission: EM | Admit: 2012-07-06 | Discharge: 2012-07-06 | Disposition: A | Discharge status: Home / Self Care | Payer: BC Managed Care – PPO | Attending: Emergency Medicine | Admitting: Emergency Medicine

## 2012-07-06 ENCOUNTER — Encounter (HOSPITAL_COMMUNITY): Payer: Self-pay | Admitting: Cardiology

## 2012-07-06 DIAGNOSIS — R1310 Dysphagia, unspecified: Secondary | ICD-10-CM | POA: Insufficient documentation

## 2012-07-06 DIAGNOSIS — R5381 Other malaise: Secondary | ICD-10-CM

## 2012-07-06 DIAGNOSIS — F411 Generalized anxiety disorder: Secondary | ICD-10-CM | POA: Insufficient documentation

## 2012-07-06 DIAGNOSIS — J029 Acute pharyngitis, unspecified: Secondary | ICD-10-CM | POA: Insufficient documentation

## 2012-07-06 DIAGNOSIS — H5789 Other specified disorders of eye and adnexa: Secondary | ICD-10-CM | POA: Insufficient documentation

## 2012-07-06 DIAGNOSIS — G8929 Other chronic pain: Secondary | ICD-10-CM | POA: Insufficient documentation

## 2012-07-06 DIAGNOSIS — J45909 Unspecified asthma, uncomplicated: Secondary | ICD-10-CM | POA: Insufficient documentation

## 2012-07-06 DIAGNOSIS — I10 Essential (primary) hypertension: Secondary | ICD-10-CM | POA: Insufficient documentation

## 2012-07-06 DIAGNOSIS — Z87440 Personal history of urinary (tract) infections: Secondary | ICD-10-CM | POA: Insufficient documentation

## 2012-07-06 DIAGNOSIS — J3489 Other specified disorders of nose and nasal sinuses: Secondary | ICD-10-CM | POA: Insufficient documentation

## 2012-07-06 DIAGNOSIS — R6883 Chills (without fever): Secondary | ICD-10-CM | POA: Insufficient documentation

## 2012-07-06 DIAGNOSIS — B309 Viral conjunctivitis, unspecified: Secondary | ICD-10-CM

## 2012-07-06 DIAGNOSIS — R5383 Other fatigue: Secondary | ICD-10-CM | POA: Insufficient documentation

## 2012-07-06 DIAGNOSIS — Z79899 Other long term (current) drug therapy: Secondary | ICD-10-CM | POA: Insufficient documentation

## 2012-07-06 DIAGNOSIS — H9209 Otalgia, unspecified ear: Secondary | ICD-10-CM | POA: Insufficient documentation

## 2012-07-06 DIAGNOSIS — Z8709 Personal history of other diseases of the respiratory system: Secondary | ICD-10-CM | POA: Insufficient documentation

## 2012-07-06 HISTORY — DX: Cervicalgia: M54.2

## 2012-07-06 HISTORY — DX: Other chronic pain: G89.29

## 2012-07-06 LAB — CBC WITH DIFFERENTIAL/PLATELET
Basophils Relative: 0 % (ref 0–1)
Eosinophils Relative: 0 % (ref 0–5)
HCT: 38.3 % (ref 36.0–46.0)
Hemoglobin: 13.5 g/dL (ref 12.0–15.0)
MCHC: 35.2 g/dL (ref 30.0–36.0)
MCV: 90.3 fL (ref 78.0–100.0)
Monocytes Absolute: 0.5 10*3/uL (ref 0.1–1.0)
Monocytes Relative: 6 % (ref 3–12)
Neutro Abs: 6.9 10*3/uL (ref 1.7–7.7)

## 2012-07-06 LAB — POCT I-STAT, CHEM 8
BUN: 13 mg/dL (ref 6–23)
Calcium, Ion: 1.17 mmol/L (ref 1.12–1.23)
Chloride: 106 mEq/L (ref 96–112)
Glucose, Bld: 107 mg/dL — ABNORMAL HIGH (ref 70–99)
HCT: 42 % (ref 36.0–46.0)
TCO2: 23 mmol/L (ref 0–100)

## 2012-07-06 NOTE — ED Provider Notes (Signed)
History     CSN: [id number]  Arrival date & time 07/06/12  1243   First MD Initiated Contact with Patient 07/06/12 1447      Chief Complaint  Patient presents with  . Eye Pain    (Consider location/radiation/quality/duration/timing/severity/associated sxs/prior treatment) HPI Comments: 52 y.o. Female with PMHx of chronic neck pain, asthma, and seasonal allergies, presents with concern of systemic infection. Pt was seen in outpt setting 07/03/12 at [facility], dx with pink eye, and told to follow up with ophthalmologist who did a full eye exam and dx with viral conjunctivitis of left eye, treated with steroids and antibiotics. Today pt decided she wasn't feeling any better, that the pain spread to her left ear and to her throat; that she had a temp of 99; and that she felt generally weak and chilled. Pt states she called opthalmologist when she was still feeling worsening pain and was told to come to the ED "within the hour" for concern of menigitis. Pt is very anxious and concerned about systemic infection. Denies nausea, vomiting, photophobia, headache, stiff neck, or altered mental status.   Other PMHx includes hypertension.   Patient is a 52 y.o. female presenting with eye pain.  Eye Pain Associated symptoms include chills, fatigue and a sore throat. Pertinent negatives include no fever or neck pain.    Past Medical History  Diagnosis Date  . HYPERTENSION 04/06/2009  . ASTHMA 04/06/2009  . Allergy   . UTI (urinary tract infection)   . Neck pain, chronic     Past Surgical History  Procedure Laterality Date  . Appendectomy    . Tonsillectomy      Family History  Problem Relation Age of Onset  . Hypertension Mother   . Hypertension Father     History  Substance Use Topics  . Smoking status: Never Smoker   . Smokeless tobacco: Not on file  . Alcohol Use: Not on file    OB History   Grav Para Term Preterm Abortions TAB SAB Ect Mult Living                  Review of  Systems  Constitutional: Positive for chills and fatigue. Negative for fever and appetite change.  HENT: Positive for ear pain, sore throat, rhinorrhea, trouble swallowing and neck stiffness. Negative for drooling and neck pain.        Pt has chronic neck stiffness  Eyes: Positive for pain and redness. Negative for photophobia.    Allergies  Ferrous sulfate and Penicillins  Home Medications   Current Outpatient Rx  Name  Route  Sig  Dispense  Refill  . ALPRAZolam (XANAX) 0.5 MG tablet      TAKE 1/2 TABLET AS NEEDED FOR SLEEP   30 tablet   0     THANK YOU   . ciclesonide (ALVESCO) 80 MCG/ACT inhaler   Inhalation   Inhale 1 puff into the lungs 2 (two) times daily. As needed for seasonal allergies   1 Inhaler   2   . ciclesonide (ALVESCO) 80 MCG/ACT inhaler   Inhalation   Inhale 1 puff into the lungs 2 (two) times daily. As needed for seasonal allergies   1 Inhaler   11   . levalbuterol (XOPENEX HFA) 45 MCG/ACT inhaler   Inhalation   Inhale 1-2 puffs into the lungs every 4 (four) hours as needed.   1 Inhaler   11   . metoprolol succinate (TOPROL-XL) 25 MG 24 hr tablet  Take one and one half tablet daily   45 tablet   11     Must be seen for refills   . metoprolol succinate (TOPROL-XL) 25 MG 24 hr tablet      TAKE 1 TABLET BY MOUTH EVERY DAY   90 tablet   3   . mometasone (NASONEX) 50 MCG/ACT nasal spray   Nasal   Place 2 sprays into the nose as needed.         . montelukast (SINGULAIR) 10 MG tablet   Oral   Take 1 tablet (10 mg total) by mouth as needed.   30 tablet   11   . valACYclovir (VALTREX) 500 MG tablet      As needed           BP 174/85  Pulse 82  Temp(Src) 99.6 F (37.6 C) (Oral)  Resp 18  SpO2 100%  LMP 06/22/2012  Physical Exam  Nursing note and vitals reviewed. Constitutional: [name] oriented to person, place, and time. She appears well-developed and well-nourished. No distress.  HENT:  Head: Normocephalic and  atraumatic. No trismus in the jaw.  Right Ear: Tympanic membrane normal. No drainage.  Left Ear: Tympanic membrane normal. No drainage.  Nose: No rhinorrhea. Right sinus exhibits no maxillary sinus tenderness and no frontal sinus tenderness. Left sinus exhibits no maxillary sinus tenderness and no frontal sinus tenderness.  Mouth/Throat: Uvula is midline. No dental caries. No oropharyngeal exudate, posterior oropharyngeal edema, posterior oropharyngeal erythema or tonsillar abscesses.  Cobble stoning of posterior pharynx, no tonsillar exudates  Eyes: EOM are normal. Pupils are equal, round, and reactive to light. Left eye exhibits discharge. Left conjunctiva is injected. Left conjunctiva has no hemorrhage.    Injected conjunctiva of left eye, mild swelling, clear discharge consistent with previously dx viral conjunctivitis  Neck: Normal range of motion and full passive range of motion without pain. Neck supple. No spinous process tenderness and no muscular tenderness present. No rigidity. Normal range of motion present.  No meningeal signs  Cardiovascular: Normal rate, regular rhythm and normal heart sounds.  Exam reveals no gallop and no friction rub.   No murmur heard. Pulmonary/Chest: Effort normal and breath sounds normal. No respiratory distress. She has no wheezes. She has no rales. She exhibits no tenderness.  Abdominal: Soft. Bowel sounds are normal. She exhibits no distension. There is no tenderness. There is no rebound and no guarding.  Musculoskeletal: Normal range of motion. She exhibits no edema and no tenderness.  5/5 strength throughout, good grip strength  Neurological: [name] alert and oriented to person, place, and time. No cranial nerve deficit.  No focal deficits. Speech is clear and goal oriented.   Skin: Skin is warm and dry. [name] not diaphoretic. No erythema.  Psychiatric:  anxious    ED Course  Procedures (including critical care time)  Labs Reviewed  CBC WITH  DIFFERENTIAL - Abnormal; Notable for the following:    Neutrophils Relative 80 (*)    All other components within normal limits  POCT I-STAT, CHEM 8 - Abnormal; Notable for the following:    Glucose, Bld 107 (*)    All other components within normal limits   No results found.   1. Malaise   2. Viral conjunctivitis       MDM  52 y.o. Female presents with concern of systemic infection. Pt is non-toxic looking, low-grade fever. Head and neck exam was benign: no signs of ear infection, sinusitis, strep throat. Neck  is supple, full ROM, no pain to palpation. Lungs CTA. Airway patent. Talking in complete sentences with no respiratory distress. No hot potato voice. D/t pt high level of anxiety, will do a CBC and chem 8 to suggest further work up.   Lab work is unconcerning. Together with benign physical exam will d/c pt to finish course of abx for viral conjunctivitis and follow up with her opthalmogoist and/or primary care doctor for future concerns. Pt was grateful for reassurance, understands diagnosis, and is in agreement with discharge plan.       Glade Nurse, PA-C 07/06/12 [phone]

## 2012-07-06 NOTE — ED Provider Notes (Signed)
Medical screening examination/treatment/procedure(s) were performed by non-physician practitioner and as supervising physician I was immediately available for consultation/collaboration.  Ethelda Chick, MD 07/06/12 [phone]

## 2012-07-06 NOTE — ED Notes (Signed)
Pt reports she was recently dx with pink eye. States she feels like the pain is getting worse. Pt reports pain in her left ear and pain with swallowing. States she has a stiffness in her neck. States she has feel generally weak and achy all over. Also states she has seasonal allegories.

## 2012-07-07 ENCOUNTER — Telehealth: Payer: Self-pay | Admitting: Family Medicine

## 2012-07-07 NOTE — Telephone Encounter (Signed)
[facility] Triage Call Report Triage Record Num: [id number] Operator: Manreet Kiernan Patient Name: Lori Love Call Date & Time: 07/05/2012 3:11:04PM Patient Phone: [phone] PCP: Evelena Peat Patient Gender: Female PCP Fax : [phone] Patient DOB: 1960-11-29 [facility] Name: [facility] Reason for Call: Caller: Heiress/Patient; PCP: Evelena Peat (Family Practice); CB#: [phone]; Call regarding Eye Infection ; Patient calls concerned tieht the viral/bacterial conjuntivitis she has. Onset was 07/02/12-was seen by office but referred to an eye specialist who is treating her with steroid/antibiotic eye drops. Has been on the drops since 07/03/12. RN explained that not quite time for antibiotic to kick yet, but viral has to run its course. Patient symptoms are worse with pain running to ear, throat from the affected eye. Drainage is more yellow with eye swollen shut. Eye specialist told patient it would get worse before it got better. Triaged Eye infection or Irirtation with care advice given from it. Is on treatment plan and course of how it would be (getting worse before better) is on track. Used Viral or bacterial conjunctivitis for education of patient regarding her concerns as to what to expect and when to seek attention. This is bascially an office note vist. Did not officially triage but used sources for education. Protocol(s) Used: Eye: Infection or Irritation Recommended Outcome per Protocol: See Provider within 24 hours Reason for Outcome: Evaluated by provider AND symptoms worsening, not improved or have returned Care Advice: Call provider if symptoms get worse, or you develop increasing eye pain, changes in vision, or blisters or sores on eye or insides of eyelids. ~ ~ Follow current treatment plan until evaluated by provider. Avoid touching or rubbing the eyes; wash hands frequently and do not share towels, washcloths or other personal care items with  others. ~ ~ Don't use eye makeup or wear contact lenses until there have been no symptoms for at least 24 hours. Discard mascara, eye liner, and eye shadow as they can be a source of infection. To avoid eye infections, discard these eye makeups every 3 to 4 months. Do not share eye cosmetics. ~ 07/05/2012 3:41:02PM Page 1 of 1 [id number] [facility] Triage Call Report Triage Record Num: [id number] Operator: Steph Cheadle Patient Name: Lori Love Call Date & Time: 07/05/2012 3:11:04PM Patient Phone: [phone] PCP: Evelena Peat Patient Gender: Female PCP Fax : [phone] Patient DOB: April 09, 1960 [facility] Name[facility] Reason for Call: Caller: Kelani/Patient; PCP: Evelena Peat (Family Practice); CB#: [phone]; Call regarding Eye Infection ; Patient calls concerned tieht the viral/bacterial conjuntivitis she has. Onset was 07/02/12-was seen by office but referred to an eye specialist who is treating her with steroid/antibiotic eye drops. Has been on the drops since 07/03/12. RN explained that not quite time for antibiotic to kick yet, but viral has to run its course. Patient symptoms are worse with pain running to ear, throat from the affected eye. Drainage is more yellow with eye swollen shut. Eye specialist told patient it would get worse before it got better. Triaged Eye infection or Irirtation with care advice given from it. Is on treatment plan and course of how it would be (getting worse before better) is on track. Used Viral or bacterial conjunctivitis for education of patient regarding her concerns as to what to expect and when to seek attention. This is bascially an office note vist. Did not officially triage but used sources for education. Protocol(s) Used: Office Note Recommended Outcome per Protocol: Information Noted and Sent to Office Reason for Outcome:  Caller information to office Care Advice: ~

## 2012-07-07 NOTE — Telephone Encounter (Signed)
[facility] Triage Call Report Triage Record Num: [id number] Operator: Hillary Bow Patient Name: Joniya Boberg Call Date & Time: 07/06/2012 11:57:22AM Patient Phone: [phone] PCP: Evelena Peat Patient Gender: Female PCP Fax : [phone] Patient DOB: 1960/08/24 [facility] Reason for Call: Caller: Quantasia/Patient; PCP: Evelena Peat (Family Practice); CB#: [phone]; Call regarding Conjuctivitis follow up, onset 1 week, w/ Sore Throat, Lymph Node swelling, Fever, Body aches, Fatigue, onset 4-19. Pt spoke w/ Triage RN on 4-19, advised to call back if symptoms worsen. Skin is red on Left side of neck w/ swollen Lymph Node and Left Ear swelling. Temp 99.5 O on 4-20. Advil taken on 4-19. Neck pain protocol used, ED dispo due to new onset of neck pain w/ forward head movement, no injury. Pt unable to touch Chin to Chest. Advised Pt to go to [facility] ED. Pt verbalized understanding. Protocol(s) Used: Neck Lump or Swelling Protocol(s) Used: Neck Pain Recommended Outcome per Protocol: See ED Immediately Reason for Outcome: Neck Pain New onset of neck pain with forward head movement (no injury) AND severe generalized headache, fever, or altered mental status Care Advice: ~ 04/

## 2012-07-09 ENCOUNTER — Telehealth: Payer: Self-pay | Admitting: Family Medicine

## 2012-07-09 MED ORDER — AZITHROMYCIN 250 MG PO TABS: ORAL_TABLET | ORAL | Status: DC

## 2012-07-09 NOTE — Telephone Encounter (Signed)
Rx sent 

## 2012-07-09 NOTE — Telephone Encounter (Signed)
Pt is requesting to talk with MD only concerning er visit on 07-06-12. Pt was seen in office on 06-27-12

## 2012-07-09 NOTE — Telephone Encounter (Signed)
Phone discussion with patient. Reviewed recent notes. Diagnosis viral conjunctivitis. Recent lab work emergency department unremarkable. Has now low-grade fever past 5 days. Unilateral facial pain. Sore throat. Offered to see patient the patient refusing. [name] requesting antibiotic. We explained this may still be viral. Agree to call and Zithromax and she must be seen promptly if she's not seeing improvement in the next couple days. [name] allergic to penicillin Ancef.  Call in Z-Pak-five-day course

## 2012-08-25 ENCOUNTER — Other Ambulatory Visit: Payer: Self-pay | Admitting: Family Medicine

## 2012-08-25 NOTE — Telephone Encounter (Signed)
Refill OK

## 2012-08-25 NOTE — Telephone Encounter (Signed)
Alprazolam 0.5, 1/2 at bedtime prn last filled 06-05-12 #30 with 0 refills

## 2012-10-09 ENCOUNTER — Telehealth: Payer: Self-pay

## 2012-10-09 NOTE — Telephone Encounter (Signed)
[facility] wants to know if it is ok to change the patient Alprazolam 0.5mg  to 1/2 to 1 tablet as needed for sleep. Pt was taking 1/2 tablet as needed for sleep. And she needs a refill  03/25/12 last visit 08/25/12 last refill #30 1 refill

## 2012-10-10 MED ORDER — ALPRAZOLAM 0.5 MG PO TABS: ORAL_TABLET | ORAL | Status: DC

## 2012-10-10 NOTE — Telephone Encounter (Signed)
yes

## 2012-12-04 ENCOUNTER — Encounter: Payer: Self-pay | Admitting: Family Medicine

## 2012-12-04 ENCOUNTER — Ambulatory Visit (INDEPENDENT_AMBULATORY_CARE_PROVIDER_SITE_OTHER): Payer: BC Managed Care – PPO | Admitting: Family Medicine

## 2012-12-04 VITALS — BP 132/74 | HR 76 | Temp 98.6°F | Wt 150.0 lb

## 2012-12-04 DIAGNOSIS — J019 Acute sinusitis, unspecified: Secondary | ICD-10-CM

## 2012-12-04 MED ORDER — AZITHROMYCIN 250 MG PO TABS: ORAL_TABLET | ORAL | Status: DC

## 2012-12-04 NOTE — Patient Instructions (Signed)

## 2012-12-04 NOTE — Progress Notes (Signed)
  Subjective:    Patient ID: Lori Love, female    DOB: 06-May-1960, 52 y.o.   MRN: [id number]  HPI Patient here with concerns for sinus infection. Onset of symptoms about 8 or 9 days ago. She has nasal congestion right greater than left. Intermittent headaches. Right maxillary pressure. No significant cough. No fever. She's noticed increased teeth pain especially right side. She has used aspirin with temporary relief of nasal congestion. [name] currently also using Nasacort without much relief of her nasal congestive symptoms.  She does have history of asthma. She has not used steroid inhaler regularly but has recently started this back. Nonsmoker. She declines flu vaccine  Past Medical History  Diagnosis Date  . HYPERTENSION 04/06/2009  . ASTHMA 04/06/2009  . Allergy   . UTI (urinary tract infection)   . Neck pain, chronic    Past Surgical History  Procedure Laterality Date  . Appendectomy    . Tonsillectomy      reports that she has never smoked. She does not have any smokeless tobacco history on file. Her alcohol and drug histories are not on file. family history includes Hypertension in her father and mother. Allergies  Allergen Reactions  . Penicillins     REACTION: hives  . Sulfa Antibiotics Rash      Review of Systems  Constitutional: Positive for fatigue. Negative for fever and chills.  HENT: Positive for congestion, sore throat and sinus pressure. Negative for ear discharge.   Respiratory: Negative for cough, shortness of breath and wheezing.   Neurological: Positive for headaches.       Objective:   Physical Exam  Constitutional: She appears well-developed and well-nourished.  HENT:  Right Ear: External ear normal.  Left Ear: External ear normal.  Mouth/Throat: Oropharynx is clear and moist.  Erythematous nasal mucosa otherwise clear  Neck: Neck supple. No thyromegaly present.  Cardiovascular: Normal rate and regular rhythm.   Pulmonary/Chest: Effort  normal and breath sounds normal. No respiratory distress. She has no wheezes. She has no rales.  Lymphadenopathy:    She has no cervical adenopathy.          Assessment & Plan:  Acute sinusitis. Zithromax for 5 days. We discussed flu vaccine and she declines

## 2013-03-06 ENCOUNTER — Telehealth: Payer: Self-pay | Admitting: Family Medicine

## 2013-03-06 NOTE — Telephone Encounter (Signed)
Pt informed

## 2013-03-06 NOTE — Telephone Encounter (Signed)
Patient has been getting her readings at 140/90 for the last 2 weeks. Pt stated that she thinks the reason why her reading her high is because of stress she has been having headaches. She has been taking her bp meds and is taking 2 and sometimes 2 1/2 to see if her bp will come down and she is taking her Xanax and states when she has taking the Xanax her bp has came down. Pt wants to know is there another med you want to add or do you want her to come in for a visit she does have a cpe coming up 03/27/13.  [facility]

## 2013-03-06 NOTE — Telephone Encounter (Signed)
Left message for patient to return call.

## 2013-03-06 NOTE — Telephone Encounter (Signed)
Pt states her bp has been running high, 142/95 ish consistently, pt refused triage nurse. Has made appt for next tues, but would prefer not tocome in. Has cpe 03/27/13. Pt has increased her .metoprolol but that has not helped. pls advise

## 2013-03-06 NOTE — Telephone Encounter (Signed)
I do recommend follow up and bring home readings with her to review.  Even though her readings are high, not urgent to get down from this range over the next 2-3 weeks and if she prefers, we can follow up in January.

## 2013-03-07 ENCOUNTER — Other Ambulatory Visit: Payer: Self-pay | Admitting: Family Medicine

## 2013-03-09 ENCOUNTER — Encounter: Payer: Self-pay | Admitting: *Deleted

## 2013-03-10 ENCOUNTER — Ambulatory Visit: Payer: BC Managed Care – PPO | Admitting: Family Medicine

## 2013-03-10 DIAGNOSIS — Z0289 Encounter for other administrative examinations: Secondary | ICD-10-CM

## 2013-03-25 ENCOUNTER — Telehealth: Payer: Self-pay | Admitting: Family Medicine

## 2013-03-25 NOTE — Telephone Encounter (Signed)
[facility] is denying PA for Lori Love.  Pt needs to try and fail one of the following:  QVar, Pulmicort, and Asmanex.

## 2013-03-25 NOTE — Telephone Encounter (Signed)
Patient stated that she was she has already taken the Qvar before and he made her shake and heart rate speed.

## 2013-03-25 NOTE — Telephone Encounter (Signed)
Try Qvar 40 mcg one puff bid and refill for 6 months.

## 2013-03-25 NOTE — Telephone Encounter (Signed)
Pulmicort 180 mcg twice daily and refill x6

## 2013-03-26 ENCOUNTER — Other Ambulatory Visit: Payer: BC Managed Care – PPO

## 2013-03-26 NOTE — Telephone Encounter (Signed)
Left message for patient to return call.

## 2013-03-27 ENCOUNTER — Other Ambulatory Visit (INDEPENDENT_AMBULATORY_CARE_PROVIDER_SITE_OTHER): Payer: BC Managed Care – PPO

## 2013-03-27 DIAGNOSIS — Z0000 Encounter for general adult medical examination without abnormal findings: Secondary | ICD-10-CM

## 2013-03-27 LAB — CBC WITH DIFFERENTIAL/PLATELET
BASOS PCT: 0.6 % (ref 0.0–3.0)
Basophils Absolute: 0 10*3/uL (ref 0.0–0.1)
EOS ABS: 0.2 10*3/uL (ref 0.0–0.7)
Eosinophils Relative: 3.7 % (ref 0.0–5.0)
HCT: 34.8 % — ABNORMAL LOW (ref 36.0–46.0)
HEMOGLOBIN: 11.9 g/dL — AB (ref 12.0–15.0)
LYMPHS PCT: 30.7 % (ref 12.0–46.0)
Lymphs Abs: 1.8 10*3/uL (ref 0.7–4.0)
MCHC: 34.2 g/dL (ref 30.0–36.0)
MCV: 92 fl (ref 78.0–100.0)
MONOS PCT: 6.5 % (ref 3.0–12.0)
Monocytes Absolute: 0.4 10*3/uL (ref 0.1–1.0)
NEUTROS ABS: 3.5 10*3/uL (ref 1.4–7.7)
Neutrophils Relative %: 58.5 % (ref 43.0–77.0)
Platelets: 244 10*3/uL (ref 150.0–400.0)
RBC: 3.79 Mil/uL — AB (ref 3.87–5.11)
RDW: 13.1 % (ref 11.5–14.6)
WBC: 6 10*3/uL (ref 4.5–10.5)

## 2013-03-27 LAB — POCT URINALYSIS DIPSTICK
Bilirubin, UA: NEGATIVE
Glucose, UA: NEGATIVE
Ketones, UA: NEGATIVE
Leukocytes, UA: NEGATIVE
Nitrite, UA: NEGATIVE
PH UA: 5.5
PROTEIN UA: NEGATIVE
RBC UA: NEGATIVE
SPEC GRAV UA: 1.025
UROBILINOGEN UA: 0.2

## 2013-03-27 LAB — TSH: TSH: 1.43 u[IU]/mL (ref 0.35–5.50)

## 2013-03-27 LAB — BASIC METABOLIC PANEL
BUN: 16 mg/dL (ref 6–23)
CALCIUM: 9.3 mg/dL (ref 8.4–10.5)
CO2: 23 meq/L (ref 19–32)
Chloride: 105 mEq/L (ref 96–112)
Creatinine, Ser: 0.8 mg/dL (ref 0.4–1.2)
GFR: 76.14 mL/min (ref >60.00)
Glucose, Bld: 84 mg/dL (ref 70–99)
POTASSIUM: 4 meq/L (ref 3.5–5.1)
SODIUM: 136 meq/L (ref 135–145)

## 2013-03-27 LAB — HEPATIC FUNCTION PANEL
ALBUMIN: 4.2 g/dL (ref 3.5–5.2)
ALK PHOS: 40 U/L (ref 39–117)
ALT: 14 U/L (ref 0–35)
AST: 17 U/L (ref 0–37)
Bilirubin, Direct: 0.1 mg/dL (ref 0.0–0.3)
TOTAL PROTEIN: 7.3 g/dL (ref 6.0–8.3)
Total Bilirubin: 0.7 mg/dL (ref 0.3–1.2)

## 2013-03-27 LAB — LDL CHOLESTEROL, DIRECT: LDL DIRECT: 100.8 mg/dL

## 2013-03-27 LAB — LIPID PANEL
CHOL/HDL RATIO: 2
Cholesterol: 220 mg/dL — ABNORMAL HIGH (ref 0–200)
HDL: 95.2 mg/dL (ref >39.00)
Triglycerides: 88 mg/dL (ref 0.0–149.0)
VLDL: 17.6 mg/dL (ref 0.0–40.0)

## 2013-04-02 ENCOUNTER — Encounter: Payer: Self-pay | Admitting: Family Medicine

## 2013-04-02 ENCOUNTER — Ambulatory Visit (INDEPENDENT_AMBULATORY_CARE_PROVIDER_SITE_OTHER): Payer: BC Managed Care – PPO | Admitting: Family Medicine

## 2013-04-02 VITALS — BP 130/70 | HR 60 | Temp 98.4°F | Ht 64.0 in | Wt 149.0 lb

## 2013-04-02 DIAGNOSIS — Z0000 Encounter for general adult medical examination without abnormal findings: Secondary | ICD-10-CM

## 2013-04-02 MED ORDER — ESCITALOPRAM OXALATE 5 MG PO TABS: 5.0000 mg | ORAL_TABLET | Freq: Every day | ORAL | Status: DC

## 2013-04-02 MED ORDER — BUDESONIDE 180 MCG/ACT IN AEPB: 2.0000 | INHALATION_SPRAY | Freq: Two times a day (BID) | RESPIRATORY_TRACT | Status: DC

## 2013-04-02 NOTE — Telephone Encounter (Signed)
Pt informed that RX is sent to pharmacy

## 2013-04-02 NOTE — Progress Notes (Signed)
Subjective:    Patient ID: Lori Love, female    DOB: [birth year], 53 y.o.   MRN: [id number]  HPI Patient is here for complete physical [name] generally very healthy. She has history of hypertension controlled with low-dose metoprolol. Both her parents have hypertension. Her mother recently died from stroke complications. She sees gynecologist regularly. She has regular menses. She's had occasional fatigue issues does not take any iron supplementation.  [name] getting regular mammograms. She declines flu vaccine. Tetanus up-to-date. No history of screening colonoscopy. She turned 52 this year.  She struggles with almost daily anxiety. She's taken very low dose alprazolam in the past. We discussed possible SSRI use. She does not relate her anxiety to specific circumstances.  Past Medical History  Diagnosis Date  . HYPERTENSION 04/06/2009  . ASTHMA 04/06/2009  . Allergy   . UTI (urinary tract infection)   . Neck pain, chronic    Past Surgical History  Procedure Laterality Date  . Appendectomy    . Tonsillectomy      reports that she has never smoked. She does not have any smokeless tobacco history on file. Her alcohol and drug histories are not on file. family history includes Dementia in her father; Hypertension in her father and mother; Stroke in her mother. Allergies  Allergen Reactions  . Penicillins     REACTION: hives  . Sulfa Antibiotics Rash      Review of Systems  Constitutional: Negative for fever, activity change, appetite change, fatigue and unexpected weight change.  HENT: Negative for ear pain, hearing loss, sore throat and trouble swallowing.   Eyes: Negative for visual disturbance.  Respiratory: Negative for cough and shortness of breath.   Cardiovascular: Negative for chest pain and palpitations.  Gastrointestinal: Negative for abdominal pain, diarrhea, constipation and blood in stool.  Genitourinary: Negative for dysuria and hematuria.  Musculoskeletal:  Negative for arthralgias, back pain and myalgias.  Skin: Negative for rash.  Neurological: Negative for dizziness, syncope and headaches.  Hematological: Negative for adenopathy.  Psychiatric/Behavioral: Negative for confusion, sleep disturbance and dysphoric mood. The patient is nervous/anxious.        Objective:   Physical Exam  Constitutional: [name] oriented to person, place, and time. She appears well-developed and well-nourished.  HENT:  Head: Normocephalic and atraumatic.  Eyes: EOM are normal. Pupils are equal, round, and reactive to light.  Neck: Normal range of motion. Neck supple. No thyromegaly present.  Cardiovascular: Normal rate, regular rhythm and normal heart sounds.   No murmur heard. Pulmonary/Chest: Breath sounds normal. No respiratory distress. She has no wheezes. She has no rales.  Abdominal: Soft. Bowel sounds are normal. She exhibits no distension and no mass. There is no tenderness. There is no rebound and no guarding.  Genitourinary:  Per GYN  Musculoskeletal: Normal range of motion. She exhibits no edema.  Lymphadenopathy:    She has no cervical adenopathy.  Neurological: [name] alert and oriented to person, place, and time. She displays normal reflexes. No cranial nerve deficit.  Skin: No rash noted.  Psychiatric: She has a normal mood and affect. Her behavior is normal. Judgment and thought content normal.          Assessment & Plan:   Complete physical.   she will continue GYN followup. Labs reviewed with no major concerns other than minimally low hemoglobin 11.9. Schedule screening colonoscopy. Recommend iron rich diet. Consider repeat CBC in one to 2 months.   Chronic anxiety.  We've recommended against  regular use of alprazolam. Start Lexapro 5 mg once daily and consider titration up in one month if still having anxiety symptoms. Limit alcohol to no more than 2 glasses one per day

## 2013-04-02 NOTE — Progress Notes (Signed)
Pre visit review using our clinic review tool, if applicable. No additional management support is needed unless otherwise documented below in the visit note. 

## 2013-04-02 NOTE — Patient Instructions (Signed)
° °  Iron-Rich Diet ° °An iron-rich diet contains foods that are good sources of iron. Iron is an important mineral that helps your body produce hemoglobin. Hemoglobin is a protein in red blood cells that carries oxygen to the body's tissues. Sometimes, the iron level in your blood can be low. This may be caused by: °· A lack of iron in your diet. °· Blood loss. °· Times of growth, such as during pregnancy or during a child's growth and development. °Low levels of iron can cause a decrease in the number of red blood cells. This can result in iron deficiency anemia. Iron deficiency anemia symptoms include: °· Tiredness. °· Weakness. °· Irritability. °· Increased chance of infection. °Here are some recommendations for daily iron intake: °· Males older than 51 years of age need 8 mg of iron per day. °· Women ages 19 to 50 need 18 mg of iron per day. °· Pregnant women need 27 mg of iron per day, and women who are over 19 years of age and breastfeeding need 9 mg of iron per day. °· Women over the age of 50 need 8 mg of iron per day. °SOURCES OF IRON °There are 2 types of iron that are found in food: heme iron and nonheme iron. Heme iron is absorbed by the body better than nonheme iron. Heme iron is found in meat, poultry, and fish. Nonheme iron is found in grains, beans, and vegetables. °Heme Iron Sources °Food / Iron (mg) °· Chicken liver, 3 oz (85 g)/ 10 mg °· Beef liver, 3 oz (85 g)/ 5.5 mg °· Oysters, 3 oz (85 g)/ 8 mg °· Beef, 3 oz (85 g)/ 2 to 3 mg °· Shrimp, 3 oz (85 g)/ 2.8 mg °· Turkey, 3 oz (85 g)/ 2 mg °· Chicken, 3 oz (85 g) / 1 mg °· Fish (tuna, halibut), 3 oz (85 g)/ 1 mg °· Pork, 3 oz (85 g)/ 0.9 mg °Nonheme Iron Sources °Food / Iron (mg) °· Ready-to-eat breakfast cereal, iron-fortified / 3.9 to 7 mg °· Tofu, ½ cup / 3.4 mg °· Kidney beans, ½ cup / 2.6 mg °· Baked potato with skin / 2.7 mg °· Asparagus, ½ cup / 2.2 mg °· Avocado / 2 mg °· Dried peaches, ½ cup / 1.6 mg °· Raisins, ½ cup / 1.5 mg °· Soy milk,  1 cup / 1.5 mg °· Whole-wheat bread, 1 slice / 1.2 mg °· Spinach, 1 cup / 0.8 mg °· Broccoli, ½ cup / 0.6 mg °IRON ABSORPTION °Certain foods can decrease the body's absorption of iron. Try to avoid these foods and beverages while eating meals with iron-containing foods: °· Coffee. °· Tea. °· Fiber. °· Soy. °Foods containing vitamin C can help increase the amount of iron your body absorbs from iron sources, especially from nonheme sources. Eat foods with vitamin C along with iron-containing foods to increase your iron absorption. Foods that are high in vitamin C include many fruits and vegetables. Some good sources are: °· Fresh orange juice. °· Oranges. °· Strawberries. °· Mangoes. °· Grapefruit. °· Red bell peppers. °· Green bell peppers. °· Broccoli. °· Potatoes with skin. °· Tomato juice. °Document Released: 10/17/2004 Document Revised: 05/28/2011 Document Reviewed: 08/24/2010 °[employer]® Patient Information ©2014 [employer]. ° °

## 2013-05-04 ENCOUNTER — Encounter: Payer: Self-pay | Admitting: Family Medicine

## 2013-06-03 ENCOUNTER — Telehealth: Payer: Self-pay | Admitting: Family Medicine

## 2013-06-03 ENCOUNTER — Other Ambulatory Visit: Payer: Self-pay

## 2013-06-03 MED ORDER — LEVOCETIRIZINE DIHYDROCHLORIDE 5 MG PO TABS: 5.0000 mg | ORAL_TABLET | Freq: Every day | ORAL | Status: DC

## 2013-06-03 NOTE — Telephone Encounter (Signed)
Pt last visit 04/02/13 I do not see that patient is currently taking the medication. And i do not see that patient has taking the medication in the past.

## 2013-06-03 NOTE — Telephone Encounter (Signed)
Pt would like a new rx generic xyzal ?mg #90 w.refills call into [facility]

## 2013-06-03 NOTE — Telephone Encounter (Signed)
Xyzal 5 mg once daily #90 with 3 refills

## 2013-06-03 NOTE — Telephone Encounter (Signed)
Rx sent to pharmacy   

## 2013-06-03 NOTE — Telephone Encounter (Signed)
Pt stated this was discuss w/md but to cost she was getting otc claritin. Pt stated since xyzal is generic she can afford it now

## 2013-07-05 ENCOUNTER — Other Ambulatory Visit: Payer: Self-pay | Admitting: Family Medicine

## 2013-12-30 ENCOUNTER — Ambulatory Visit (INDEPENDENT_AMBULATORY_CARE_PROVIDER_SITE_OTHER): Payer: BC Managed Care – PPO | Admitting: Physician Assistant

## 2013-12-30 ENCOUNTER — Encounter: Payer: Self-pay | Admitting: Physician Assistant

## 2013-12-30 VITALS — BP 134/78 | HR 67 | Temp 98.1°F | Resp 18 | Wt 149.5 lb

## 2013-12-30 DIAGNOSIS — H578 Other specified disorders of eye and adnexa: Secondary | ICD-10-CM

## 2013-12-30 DIAGNOSIS — H5789 Other specified disorders of eye and adnexa: Secondary | ICD-10-CM

## 2013-12-30 DIAGNOSIS — J019 Acute sinusitis, unspecified: Secondary | ICD-10-CM

## 2013-12-30 MED ORDER — DOXYCYCLINE HYCLATE 100 MG PO TABS: 100.0000 mg | ORAL_TABLET | Freq: Two times a day (BID) | ORAL | Status: DC

## 2013-12-30 NOTE — Patient Instructions (Addendum)
Doxycycline twice daily for 10 days to treat sinus infection. He has full meal prevent nausea. Also, this medication increases skin sensitivity to sun, so wear sun protection if you're going to be outside for extended period of time.  Plain Over the Counter Mucinex (NOT Mucinex D) for thick secretions  Force NON dairy fluids, drinking plenty of water is best.    Over the Counter Flonase OR Nasacort AQ 1 spray in each nostril twice a day as needed. Use the "crossover" technique into opposite nostril spraying toward opposite ear @ 45 degree angle, not straight up into nostril.   Plain Over the Counter Allegra (NOT D )  160 daily , OR Loratidine 10 mg , OR Zyrtec 10 mg @ bedtime  as needed for itchy eyes & sneezing. OR Xyzal if you choose to refill this.  Saline Irrigation and Saline Sprays can also help reduce symptoms.     Continue to monitor your Eyes, and return for any increased redness, itching, pain, swelling, discolored drainage, or persistence.  If emergency symptoms discussed during visit developed, seek medical attention immediately.  Followup as needed, or for worsening or persistent symptoms despite treatment.   Sinusitis Sinusitis is redness, soreness, and puffiness (inflammation) of the air pockets in the bones of your face (sinuses). The redness, soreness, and puffiness can cause air and mucus to get trapped in your sinuses. This can allow germs to grow and cause an infection.  HOME CARE   Drink enough fluids to keep your pee (urine) clear or pale yellow.  Use a humidifier in your home.  Run a hot shower to create steam in the bathroom. Sit in the bathroom with the door closed. Breathe in the steam 3-4 times a day.  Put a warm, moist washcloth on your face 3-4 times a day, or as told by your doctor.  Use salt water sprays (saline sprays) to wet the thick fluid in your nose. This can help the sinuses drain.  Only take medicine as told by your doctor. GET HELP RIGHT  AWAY IF:   Your pain gets worse.  You have very bad headaches.  You are sick to your stomach (nauseous).  You throw up (vomit).  You are very sleepy (drowsy) all the time.  Your face is puffy (swollen).  Your vision changes.  You have a stiff neck.  You have trouble breathing. MAKE SURE YOU:   Understand these instructions.  Will watch your condition.  Will get help right away if you are not doing well or get worse. Document Released: 08/22/2007 Document Revised: 11/28/2011 Document Reviewed: 10/09/2011 [facility] Patient Information 2015 [place], [place]. This information is not intended to replace advice given to you by your health care provider. Make sure you discuss any questions you have with your health care provider.

## 2013-12-30 NOTE — Progress Notes (Signed)
Subjective:    Patient ID: Lori Love, female    DOB: 1960/09/28, 53 y.o.   MRN: [id number]  Sinusitis This is a new problem. The current episode started 1 to 4 weeks ago (9 days). There has been no fever. Associated symptoms include chills, congestion, coughing (thin to thick green sputum.), headaches, a hoarse voice, sinus pressure and a sore throat. Pertinent negatives include no diaphoresis, ear pain, neck pain, shortness of breath, sneezing or swollen glands. Treatments tried: nasocort, albuterol, saline spray, xyzal, sudafed,  The treatment provided mild relief.  Eye Problem  Both eyes are affected.This is a new problem. The current episode started in the past 7 days (2-3 days). The problem occurs intermittently. The problem has been waxing and waning. There was no injury mechanism. The patient is experiencing no pain. There is no known exposure to pink eye. She does not wear contacts. Associated symptoms include an eye discharge (clear, at night time only), nausea and vomiting (mucus, from coughing. only 1 episode.). Pertinent negatives include no blurred vision, double vision, eye redness, fever, foreign body sensation, itching, photophobia or recent URI. She has tried water for the symptoms. The treatment provided mild relief.     Review of Systems  Constitutional: Positive for chills. Negative for fever and diaphoresis.  HENT: Positive for congestion, hoarse voice, postnasal drip, sinus pressure and sore throat. Negative for ear pain and sneezing.   Eyes: Positive for discharge (clear, at night time only). Negative for blurred vision, double vision, photophobia and redness.  Respiratory: Positive for cough (thin to thick green sputum.) and chest tightness. Negative for shortness of breath and wheezing (has been taking albuterol).   Cardiovascular: Negative for chest pain.  Gastrointestinal: Positive for nausea and vomiting (mucus, from coughing. only 1 episode.). Negative for abdominal  pain and diarrhea.  Musculoskeletal: Negative for neck pain.  Skin: Negative for itching.  Neurological: Positive for headaches. Negative for syncope.  All other systems reviewed and are negative.    Past Medical History  Diagnosis Date  . HYPERTENSION 04/06/2009  . ASTHMA 04/06/2009  . Allergy   . UTI (urinary tract infection)   . Neck pain, chronic     History   Social History  . Marital Status: Married    Spouse Name: N/A    Number of Children: N/A  . Years of Education: N/A   Occupational History  . Not on file.   Social History Main Topics  . Smoking status: Never Smoker   . Smokeless tobacco: Not on file  . Alcohol Use: Not on file  . Drug Use: Not on file  . Sexual Activity: Not on file   Other Topics Concern  . Not on file   Social History Narrative  . No narrative on file    Past Surgical History  Procedure Laterality Date  . Appendectomy    . Tonsillectomy      Family History  Problem Relation Age of Onset  . Hypertension Mother   . Stroke Mother   . Hypertension Father   . Dementia Father     Allergies  Allergen Reactions  . Penicillins     REACTION: hives  . Sulfa Antibiotics Rash    Current Outpatient Prescriptions on File Prior to Visit  Medication Sig Dispense Refill  . ALPRAZolam (XANAX) 0.5 MG tablet Take 1/2 to 1 tablet as needed for sleep  30 tablet  1  . budesonide (PULMICORT) 180 MCG/ACT inhaler Inhale 2 puffs into the lungs 2 (two)  times daily.  1 Inhaler  6  . escitalopram (LEXAPRO) 5 MG tablet Take 1 tablet (5 mg total) by mouth daily.  30 tablet  11  . ibuprofen (ADVIL,MOTRIN) 200 MG tablet Take 200 mg by mouth every 6 (six) hours as needed for pain.      Marland Kitchen levocetirizine (XYZAL) 5 MG tablet Take 1 tablet (5 mg total) by mouth daily.  90 tablet  3  . metoprolol succinate (TOPROL-XL) 25 MG 24 hr tablet TAKE ONE AND ONE HALF TABLET DAILY  45 tablet  11  . montelukast (SINGULAIR) 10 MG tablet Take 10 mg by mouth at bedtime.       . montelukast (SINGULAIR) 10 MG tablet TAKE 1 TABLET BY MOUTH EVERY DAY AS NEEDED  30 tablet  7  . triamcinolone (NASACORT) 55 MCG/ACT nasal inhaler Place 2 sprays into the nose daily.       No current facility-administered medications on file prior to visit.    EXAM: BP 134/78  Pulse 67  Temp(Src) 98.1 F (36.7 C) (Oral)  Resp 18  Wt 149 lb 8 oz (67.813 kg)  SpO2 100%      Objective:   Physical Exam  Nursing note and vitals reviewed. Constitutional: [name] oriented to person, place, and time. She appears well-developed and well-nourished. No distress.  HENT:  Head: Normocephalic and atraumatic.  Right Ear: External ear normal.  Left Ear: External ear normal.  Nose: Nose normal.  Mouth/Throat: No oropharyngeal exudate.  Oropharynx is slightly erythematous, no exudate. Bilateral TMs normal. Bilateral frontal sinuses non-TTP. Bilat. Maxillary sinuses TTP.  Eyes: Conjunctivae and EOM are normal. Pupils are equal, round, and reactive to light. Right eye exhibits no discharge. Left eye exhibits no discharge.  Bilateral eyes: Conjunctiva normal, no visible drainage or matting, no periorbital edema, vision grossly intact.  Neck: Normal range of motion. Neck supple.  Cardiovascular: Normal rate, regular rhythm and intact distal pulses.   Pulmonary/Chest: Effort normal and breath sounds normal. No stridor. No respiratory distress. She has no wheezes. She has no rales. She exhibits no tenderness.  Lymphadenopathy:    She has no cervical adenopathy.  Neurological: [name] alert and oriented to person, place, and time.  Skin: Skin is warm and dry. [name] not diaphoretic. No pallor.  Psychiatric: She has a normal mood and affect. Her behavior is normal. Judgment and thought content normal.     Lab Results  Component Value Date   WBC 6.0 03/27/2013   HGB 11.9* 03/27/2013   HCT 34.8* 03/27/2013   PLT 244.0 03/27/2013   GLUCOSE 84 03/27/2013   CHOL 220* 03/27/2013   TRIG 88.0 03/27/2013    HDL 95.20 03/27/2013   LDLDIRECT 100.8 03/27/2013   LDLCALC 87 03/14/2012   ALT 14 03/27/2013   AST 17 03/27/2013   NA 136 03/27/2013   K 4.0 03/27/2013   CL 105 03/27/2013   CREATININE 0.8 03/27/2013   BUN 16 03/27/2013   CO2 23 03/27/2013   TSH 1.43 03/27/2013        Assessment & Plan:  Lori Love was seen today for sinusitis and eye problem.  Diagnoses and associated orders for this visit:  Acute sinusitis, recurrence not specified, unspecified location Comments: 9 days, treat with Doxy due to pcn allergy, OTC mucinex, nasal steroid, antihistamine, rest, push fluids, watchful waiting. - doxycycline (VIBRA-TABS) 100 MG tablet; Take 1 tablet (100 mg total) by mouth 2 (two) times daily.  Eye drainage Comments: None currently, pt reports nighttime  only, no conjunctivitis currently. Watchful waiting.     Return precautions provided, and patient handout on sinusitis.  Plan to follow up as needed, or for worsening or persistent symptoms despite treatment.  Patient Instructions  Doxycycline twice daily for 10 days to treat sinus infection. He has full meal prevent nausea. Also, this medication increases skin sensitivity to sun, so wear sun protection if you're going to be outside for extended period of time.  Plain Over the Counter Mucinex (NOT Mucinex D) for thick secretions  Force NON dairy fluids, drinking plenty of water is best.    Over the Counter Flonase OR Nasacort AQ 1 spray in each nostril twice a day as needed. Use the "crossover" technique into opposite nostril spraying toward opposite ear @ 45 degree angle, not straight up into nostril.   Plain Over the Counter Allegra (NOT D )  160 daily , OR Loratidine 10 mg , OR Zyrtec 10 mg @ bedtime  as needed for itchy eyes & sneezing. OR Xyzal if you choose to refill this.  Saline Irrigation and Saline Sprays can also help reduce symptoms.     Continue to monitor your Eyes, and return for any increased redness, itching, pain, swelling,  discolored drainage, or persistence.  If emergency symptoms discussed during visit developed, seek medical attention immediately.  Followup as needed, or for worsening or persistent symptoms despite treatment.

## 2014-02-08 ENCOUNTER — Telehealth: Payer: Self-pay | Admitting: Family Medicine

## 2014-02-08 NOTE — Telephone Encounter (Signed)
Okay 

## 2014-02-08 NOTE — Telephone Encounter (Signed)
Pt has been sch for tomorrow at 10.30 am

## 2014-02-08 NOTE — Telephone Encounter (Addendum)
Pt is having shoulder pain and thinks a tendon is out of place. Can I USE 1030 AM SLOT TOMORROW?

## 2014-02-09 ENCOUNTER — Ambulatory Visit: Payer: BC Managed Care – PPO | Admitting: Family Medicine

## 2014-03-10 ENCOUNTER — Other Ambulatory Visit: Payer: Self-pay | Admitting: Family Medicine

## 2014-03-15 ENCOUNTER — Other Ambulatory Visit: Payer: Self-pay | Admitting: Family Medicine

## 2014-05-06 ENCOUNTER — Other Ambulatory Visit: Payer: Self-pay

## 2014-05-13 ENCOUNTER — Encounter: Payer: BLUE CROSS/BLUE SHIELD | Admitting: Family Medicine

## 2014-05-27 ENCOUNTER — Other Ambulatory Visit (INDEPENDENT_AMBULATORY_CARE_PROVIDER_SITE_OTHER): Payer: BLUE CROSS/BLUE SHIELD

## 2014-05-27 DIAGNOSIS — Z0000 Encounter for general adult medical examination without abnormal findings: Secondary | ICD-10-CM

## 2014-05-27 LAB — CBC WITH DIFFERENTIAL/PLATELET
BASOS PCT: 0.8 % (ref 0.0–3.0)
Basophils Absolute: 0 10*3/uL (ref 0.0–0.1)
Eosinophils Absolute: 0.2 10*3/uL (ref 0.0–0.7)
Eosinophils Relative: 3.2 % (ref 0.0–5.0)
HEMATOCRIT: 37.3 % (ref 36.0–46.0)
HEMOGLOBIN: 12.6 g/dL (ref 12.0–15.0)
Lymphocytes Relative: 32.6 % (ref 12.0–46.0)
Lymphs Abs: 1.8 10*3/uL (ref 0.7–4.0)
MCHC: 33.9 g/dL (ref 30.0–36.0)
MCV: 91.7 fl (ref 78.0–100.0)
Monocytes Absolute: 0.4 10*3/uL (ref 0.1–1.0)
Monocytes Relative: 7.6 % (ref 3.0–12.0)
NEUTROS ABS: 3.1 10*3/uL (ref 1.4–7.7)
NEUTROS PCT: 55.8 % (ref 43.0–77.0)
Platelets: 265 10*3/uL (ref 150.0–400.0)
RBC: 4.07 Mil/uL (ref 3.87–5.11)
RDW: 13.1 % (ref 11.5–15.5)
WBC: 5.5 10*3/uL (ref 4.0–10.5)

## 2014-05-27 LAB — LIPID PANEL
CHOL/HDL RATIO: 3
Cholesterol: 229 mg/dL — ABNORMAL HIGH (ref 0–200)
HDL: 90.2 mg/dL (ref >39.00)
LDL CALC: 120 mg/dL — AB (ref 0–99)
NonHDL: 138.8
Triglycerides: 92 mg/dL (ref 0.0–149.0)
VLDL: 18.4 mg/dL (ref 0.0–40.0)

## 2014-05-27 LAB — COMPREHENSIVE METABOLIC PANEL
ALT: 10 U/L (ref 0–35)
AST: 17 U/L (ref 0–37)
Albumin: 4.5 g/dL (ref 3.5–5.2)
Alkaline Phosphatase: 49 U/L (ref 39–117)
BUN: 16 mg/dL (ref 6–23)
CALCIUM: 9.7 mg/dL (ref 8.4–10.5)
CO2: 27 meq/L (ref 19–32)
CREATININE: 0.91 mg/dL (ref 0.40–1.20)
Chloride: 104 mEq/L (ref 96–112)
GFR: 69.1 mL/min (ref >60.00)
Glucose, Bld: 88 mg/dL (ref 70–99)
Potassium: 4.6 mEq/L (ref 3.5–5.1)
SODIUM: 135 meq/L (ref 135–145)
Total Bilirubin: 0.4 mg/dL (ref 0.2–1.2)
Total Protein: 7.6 g/dL (ref 6.0–8.3)

## 2014-05-27 LAB — TSH: TSH: 1.43 u[IU]/mL (ref 0.35–4.50)

## 2014-06-03 ENCOUNTER — Encounter: Payer: Self-pay | Admitting: Family Medicine

## 2014-06-03 ENCOUNTER — Ambulatory Visit (INDEPENDENT_AMBULATORY_CARE_PROVIDER_SITE_OTHER): Payer: BLUE CROSS/BLUE SHIELD | Admitting: Family Medicine

## 2014-06-03 VITALS — BP 126/70 | HR 68 | Temp 98.0°F | Ht 64.0 in | Wt 151.0 lb

## 2014-06-03 DIAGNOSIS — Z0000 Encounter for general adult medical examination without abnormal findings: Secondary | ICD-10-CM

## 2014-06-03 MED ORDER — ALBUTEROL SULFATE HFA 108 (90 BASE) MCG/ACT IN AERS: 2.0000 | INHALATION_SPRAY | Freq: Four times a day (QID) | RESPIRATORY_TRACT | Status: DC | PRN

## 2014-06-03 NOTE — Progress Notes (Signed)
   Subjective:    Patient ID: Lori Love, female    DOB: May 08, 1960, 54 y.o.   MRN: [id number]  HPI Patient seen for physical. She has history of hypertension, and asthma. Her asthma is mild intermittent. [name] used Xopenex in the past but had jitters and palpitations with regular albuterol. Unfortunately, her insurance did not cover the Xopenex. She's never had colonoscopy. Tetanus up-to-date. She declines flu vaccine. She sees gynecologist regularly. Mammogram up-to-date. [name] very active and plays tennis several times a week and also walks for exercise  Past Medical History  Diagnosis Date  . HYPERTENSION 04/06/2009  . ASTHMA 04/06/2009  . Allergy   . UTI (urinary tract infection)   . Neck pain, chronic    Past Surgical History  Procedure Laterality Date  . Appendectomy    . Tonsillectomy      reports that she has never smoked. She does not have any smokeless tobacco history on file. Her alcohol and drug histories are not on file. family history includes Dementia in her father; Hypertension in her father and mother; Stroke in her mother. Allergies  Allergen Reactions  . Penicillins     REACTION: hives  . Sulfa Antibiotics Rash      Review of Systems  Constitutional: Negative for fever, activity change, appetite change, fatigue and unexpected weight change.  HENT: Negative for ear pain, hearing loss, sore throat and trouble swallowing.   Eyes: Negative for visual disturbance.  Respiratory: Negative for cough and shortness of breath.   Cardiovascular: Negative for chest pain and palpitations.  Gastrointestinal: Negative for abdominal pain, diarrhea, constipation and blood in stool.  Genitourinary: Negative for dysuria and hematuria.  Musculoskeletal: Negative for myalgias, back pain and arthralgias.  Skin: Negative for rash.  Neurological: Negative for dizziness, syncope and headaches.  Hematological: Negative for adenopathy.  Psychiatric/Behavioral: Negative for  confusion and dysphoric mood.       Objective:   Physical Exam  Constitutional: [name] oriented to person, place, and time. She appears well-developed and well-nourished.  HENT:  Head: Normocephalic and atraumatic.  Eyes: EOM are normal. Pupils are equal, round, and reactive to light.  Neck: Normal range of motion. Neck supple. No thyromegaly present.  Cardiovascular: Normal rate, regular rhythm and normal heart sounds.   No murmur heard. Pulmonary/Chest: Breath sounds normal. No respiratory distress. She has no wheezes. She has no rales.  Abdominal: Soft. Bowel sounds are normal. She exhibits no distension and no mass. There is no tenderness. There is no rebound and no guarding.  Genitourinary:  Per gyn  Musculoskeletal: Normal range of motion. She exhibits no edema.  Lymphadenopathy:    She has no cervical adenopathy.  Neurological: [name] alert and oriented to person, place, and time. She displays normal reflexes. No cranial nerve deficit.  Skin: No rash noted.  Psychiatric: She has a normal mood and affect. Her behavior is normal. Judgment and thought content normal.          Assessment & Plan:  Complete physical. Schedule colonoscopy. Continue regular exercise habits. We discussed strategies for weight control. Scale back calorie intake. She currently drinks about 2 glasses of wine per night. Consider reducing this to one glass.

## 2014-06-03 NOTE — Progress Notes (Signed)
Pre visit review using our clinic review tool, if applicable. No additional management support is needed unless otherwise documented below in the visit note. 

## 2014-07-06 ENCOUNTER — Other Ambulatory Visit: Payer: Self-pay | Admitting: Family Medicine

## 2014-07-09 ENCOUNTER — Encounter: Payer: Self-pay | Admitting: Gastroenterology

## 2014-08-02 ENCOUNTER — Other Ambulatory Visit: Payer: Self-pay | Admitting: Family Medicine

## 2014-08-27 ENCOUNTER — Encounter: Payer: Self-pay | Admitting: Family Medicine

## 2014-08-27 ENCOUNTER — Ambulatory Visit (INDEPENDENT_AMBULATORY_CARE_PROVIDER_SITE_OTHER): Payer: BLUE CROSS/BLUE SHIELD | Admitting: Family Medicine

## 2014-08-27 VITALS — BP 118/70 | HR 68 | Temp 98.1°F | Wt 152.6 lb

## 2014-08-27 DIAGNOSIS — S00431A Contusion of right ear, initial encounter: Secondary | ICD-10-CM

## 2014-08-27 NOTE — Progress Notes (Signed)
   Subjective:    Patient ID: Lori Love, female    DOB: 27-May-1960, 54 y.o.   MRN: [id number]  HPI  Acute visit. Patient was playing tennis yesterday and went in at and the opponent hit the tennis ball with high velocity and she turned her head and this hit flatly against her right external ear. She had some loud ringing for several minutes but no hearing loss. No vertigo. No bleeding. She noticed some mild swelling of the right external ear. She applied some ice. Her ringing is totally resolved at this time. No headaches. No ear canal drainage. No bleeding.  Past Medical History  Diagnosis Date  . HYPERTENSION 04/06/2009  . ASTHMA 04/06/2009  . Allergy   . UTI (urinary tract infection)   . Neck pain, chronic    Past Surgical History  Procedure Laterality Date  . Appendectomy    . Tonsillectomy      reports that she has never smoked. She does not have any smokeless tobacco history on file. Her alcohol and drug histories are not on file. family history includes Dementia in her father; Hypertension in her father and mother; Stroke in her mother. Allergies  Allergen Reactions  . Penicillins     REACTION: hives  . Sulfa Antibiotics Rash     Review of Systems  Constitutional: Negative for fever and chills.  HENT: Positive for tinnitus. Negative for hearing loss.   Neurological: Negative for dizziness and headaches.       Objective:   Physical Exam  Constitutional: She appears well-developed and well-nourished.  HENT:  Head: Normocephalic and atraumatic.  Left Ear: External ear normal.  Right external ear minimally swollen around the earlobe compared with the left but no hematoma. No cellulitis changes. Nontender. Right eardrum is normal in appearance. No perforation  Neck: Neck supple.  Cardiovascular: Normal rate and regular rhythm.   Pulmonary/Chest: Effort normal and breath sounds normal. No respiratory distress. She has no wheezes. She has no rales.            Assessment & Plan:  Right ear pain following tennis injury as above. Contusion. No evidence for perforation of TM. No evidence for hematoma. Reassurance.

## 2014-08-27 NOTE — Progress Notes (Signed)
Pre visit review using our clinic review tool, if applicable. No additional management support is needed unless otherwise documented below in the visit note. 

## 2014-10-01 ENCOUNTER — Other Ambulatory Visit: Payer: Self-pay | Admitting: Family Medicine

## 2014-10-02 NOTE — Telephone Encounter (Signed)
May refill #30 with no refill (one po q 8 hours prn severe anxiety) and avoid regular use.

## 2014-10-07 ENCOUNTER — Ambulatory Visit (INDEPENDENT_AMBULATORY_CARE_PROVIDER_SITE_OTHER): Payer: BLUE CROSS/BLUE SHIELD | Admitting: Family Medicine

## 2014-10-07 ENCOUNTER — Encounter: Payer: Self-pay | Admitting: Family Medicine

## 2014-10-07 VITALS — BP 124/80 | HR 63 | Temp 98.1°F | Wt 152.0 lb

## 2014-10-07 DIAGNOSIS — S00252A Superficial foreign body of left eyelid and periocular area, initial encounter: Secondary | ICD-10-CM

## 2014-10-07 DIAGNOSIS — T675XXA Heat exhaustion, unspecified, initial encounter: Secondary | ICD-10-CM | POA: Diagnosis not present

## 2014-10-07 DIAGNOSIS — H02816 Retained foreign body in left eye, unspecified eyelid: Secondary | ICD-10-CM

## 2014-10-07 DIAGNOSIS — I10 Essential (primary) hypertension: Secondary | ICD-10-CM

## 2014-10-07 DIAGNOSIS — Z0110 Encounter for examination of ears and hearing without abnormal findings: Secondary | ICD-10-CM

## 2014-10-07 DIAGNOSIS — D171 Benign lipomatous neoplasm of skin and subcutaneous tissue of trunk: Secondary | ICD-10-CM

## 2014-10-07 MED ORDER — POLYMYXIN B-TRIMETHOPRIM 10000-0.1 UNIT/ML-% OP SOLN: 2.0000 [drp] | OPHTHALMIC | Status: DC

## 2014-10-07 NOTE — Patient Instructions (Signed)
Try reducing Toprol to one daily

## 2014-10-07 NOTE — Progress Notes (Signed)
Subjective:    Patient ID: Lori Love, female    DOB: 10/13/1960, 54 y.o.   MRN: [id number]  HPI Patient here for several things as follows  Recent case of probable heat exhaustion. She states she's had at least a few episodes where she was out playing tennis and had multiple symptoms including fatigue, chills, nausea, dizziness, headache. Each time she eventually improved with fluids and cooling down after about an hour. She denies any history of heat stroke. She had read that beta blockers could exacerbate.  Hypertension treated with metoprolol. She's been on this for several years. No history of migraine headaches or central tremor. No history of palpitations or arrhythmia. Blood pressures been stable recently.  New, acute problem of irritation left eye. Onset yesterday. No blurred vision. No drainage. She feels that she has something painful on the inside of her left upper eyelid.  Fatty mass subcutaneously right upper back. She was told by massage therapist this was a lipoma. Not painful or changing in size  Question of subjective mild hearing loss right ear following injury with tennis ball many weeks ago. There was no evidence for any obvious barotrauma  Past Medical History  Diagnosis Date  . HYPERTENSION 04/06/2009  . ASTHMA 04/06/2009  . Allergy   . UTI (urinary tract infection)   . Neck pain, chronic    Past Surgical History  Procedure Laterality Date  . Appendectomy    . Tonsillectomy      reports that she has never smoked. She does not have any smokeless tobacco history on file. Her alcohol and drug histories are not on file. family history includes Dementia in her father; Hypertension in her father and mother; Stroke in her mother. Allergies  Allergen Reactions  . Penicillins     REACTION: hives  . Sulfa Antibiotics Rash      Review of Systems  Constitutional: Negative for fever and chills.  Eyes: Positive for pain. Negative for discharge, redness, itching  and visual disturbance.  Respiratory: Negative for shortness of breath.   Cardiovascular: Negative for chest pain.  Gastrointestinal: Negative for abdominal pain.       Objective:   Physical Exam  Constitutional: She appears well-developed and well-nourished.  Eyes: Conjunctivae are normal. Pupils are equal, round, and reactive to light. Right eye exhibits no discharge. Left eye exhibits no discharge.  Left eye exam reveals small black colored fleck of material which seems to be embedded left upper eyelid. We tried irrigating this several times without success. Used a small tuberculin syringe and were able to remove this and patient had immediate resolution of her eye pain  Neck: Neck supple. No thyromegaly present.  Cardiovascular: Normal rate and regular rhythm.   Pulmonary/Chest: Effort normal and breath sounds normal. No respiratory distress. She has no wheezes. She has no rales.  Musculoskeletal: She exhibits no edema.  Skin:  Patient has approximately 3 cm nontender fatty tumor right upper back. No atypical features          Assessment & Plan:  #1 history of classic heat exhaustion symptoms. We explained the fact that people with prior history of heat illness are more prone sometimes to recurrent episodes. Take usual precautions and stay well-hydrated. [name] aware of signs and symptoms #2 benign lipoma right upper back. Reassurance. Follow-up for any rapid growth or pain #3 foreign-body left upper eyelid. This was removed with small tuberculin syringe. If she has any drainage or signs of infection start Polytrim eyedrops #4 hypertension.  This is stable and at goal. Try reducing metoprolol to 1 daily. She will continue tapering at 2 week intervals and try tapering off to see if blood pressure stable. #5 question of hearing loss. We attempted audiometry but probable machine malfunction. We recommended she be assessed by Audiology.

## 2014-10-07 NOTE — Progress Notes (Signed)
Pre visit review using our clinic review tool, if applicable. No additional management support is needed unless otherwise documented below in the visit note. 

## 2015-01-11 ENCOUNTER — Other Ambulatory Visit: Payer: Self-pay | Admitting: Family Medicine

## 2015-01-12 ENCOUNTER — Other Ambulatory Visit: Payer: Self-pay | Admitting: Family Medicine

## 2015-01-12 MED ORDER — LEVALBUTEROL TARTRATE 45 MCG/ACT IN AERO: INHALATION_SPRAY | RESPIRATORY_TRACT | Status: DC

## 2015-01-12 NOTE — Telephone Encounter (Signed)
rx for XOPENEX HFA 45 MCG/ACT inhaler Did not go through. Pt having allergy issues. Can you call in again?   [facility]/ [address]

## 2015-01-24 ENCOUNTER — Other Ambulatory Visit: Payer: Self-pay | Admitting: Family Medicine

## 2015-02-24 ENCOUNTER — Telehealth: Payer: Self-pay | Admitting: Family Medicine

## 2015-02-24 NOTE — Telephone Encounter (Signed)
Pt will need an appointment  

## 2015-02-24 NOTE — Telephone Encounter (Signed)
Pt said she think she has a bladder infection and is asking if she can come in and have a lab and then see you next week. The only thing available for Dr Elease Hashimoto is same day for tomorrow.

## 2015-02-28 NOTE — Telephone Encounter (Signed)
Pt scheduled  

## 2015-03-02 ENCOUNTER — Ambulatory Visit (INDEPENDENT_AMBULATORY_CARE_PROVIDER_SITE_OTHER): Payer: BLUE CROSS/BLUE SHIELD | Admitting: Family Medicine

## 2015-03-02 ENCOUNTER — Encounter: Payer: Self-pay | Admitting: Family Medicine

## 2015-03-02 VITALS — BP 100/60 | HR 70 | Temp 98.1°F | Resp 16 | Ht 64.0 in | Wt 158.0 lb

## 2015-03-02 DIAGNOSIS — N390 Urinary tract infection, site not specified: Secondary | ICD-10-CM

## 2015-03-02 DIAGNOSIS — R3915 Urgency of urination: Secondary | ICD-10-CM

## 2015-03-02 LAB — POCT URINALYSIS DIPSTICK
Glucose, UA: NEGATIVE
NITRITE UA: POSITIVE
PH UA: 5
Spec Grav, UA: <=1.005
UROBILINOGEN UA: 0.2

## 2015-03-02 MED ORDER — NITROFURANTOIN MONOHYD MACRO 100 MG PO CAPS: 100.0000 mg | ORAL_CAPSULE | Freq: Two times a day (BID) | ORAL | Status: DC

## 2015-03-02 NOTE — Progress Notes (Signed)
Pre visit review using our clinic review tool, if applicable. No additional management support is needed unless otherwise documented below in the visit note. 

## 2015-03-02 NOTE — Progress Notes (Signed)
   Subjective:    Patient ID: Lori Love, female    DOB: 09-Jan-1961, 54 y.o.   MRN: [id number]  HPI Acute visit. Patient battling urine frequency and intermittent burning with urination for several weeks Symptoms tend to wax and wane. She took some cranberry tablets which seemed to help initially. No fevers or chills. No back pain. No abdominal pain. Some urine urgency. No gross hematuria. No recent UTI.  Past Medical History  Diagnosis Date  . HYPERTENSION 04/06/2009  . ASTHMA 04/06/2009  . Allergy   . UTI (urinary tract infection)   . Neck pain, chronic    Past Surgical History  Procedure Laterality Date  . Appendectomy    . Tonsillectomy      reports that she has never smoked. She does not have any smokeless tobacco history on file. Her alcohol and drug histories are not on file. family history includes Dementia in her father; Hypertension in her father and mother; Stroke in her mother. Allergies  Allergen Reactions  . Penicillins     REACTION: hives  . Sulfa Antibiotics Rash      Review of Systems  Constitutional: Negative for fever, chills and appetite change.  Gastrointestinal: Negative for nausea, vomiting, abdominal pain, diarrhea and constipation.  Genitourinary: Positive for dysuria and frequency.  Musculoskeletal: Negative for back pain.  Neurological: Negative for dizziness.       Objective:   Physical Exam  Constitutional: She appears well-developed and well-nourished.  HENT:  Head: Normocephalic and atraumatic.  Neck: Neck supple. No thyromegaly present.  Cardiovascular: Normal rate, regular rhythm and normal heart sounds.   Pulmonary/Chest: Breath sounds normal.  Abdominal: Soft. Bowel sounds are normal. There is no tenderness.          Assessment & Plan:  Uncomplicated cystitis. Urine dipstick highly suggests this. Check urine culture. Macrobid 1 twice a day for 5 days. Follow-up for any persistent or worsening symptoms

## 2015-03-02 NOTE — Patient Instructions (Signed)

## 2015-03-03 DIAGNOSIS — M7711 Lateral epicondylitis, right elbow: Secondary | ICD-10-CM | POA: Insufficient documentation

## 2015-03-03 DIAGNOSIS — M25521 Pain in right elbow: Secondary | ICD-10-CM | POA: Insufficient documentation

## 2015-03-04 LAB — URINE CULTURE: Colony Count: >=100000

## 2015-03-15 ENCOUNTER — Other Ambulatory Visit: Payer: Self-pay | Admitting: Family Medicine

## 2015-03-16 NOTE — Telephone Encounter (Signed)
Refill once 

## 2015-03-16 NOTE — Telephone Encounter (Signed)
Pt last visit 03/02/15 Pt last Rx refill 10/04/14 #30 no refills

## 2015-05-24 LAB — BASIC METABOLIC PANEL
BUN: 15 mg/dL (ref 4–21)
Creatinine: 0.8 mg/dL (ref 0.5–1.1)
GLUCOSE: 89 mg/dL
Potassium: 4.1 mmol/L (ref 3.4–5.3)
Sodium: 136 mmol/L — AB (ref 137–147)

## 2015-05-24 LAB — LIPID PANEL
CHOLESTEROL: 225 mg/dL — AB (ref 0–200)
HDL: 96 mg/dL — AB (ref 35–70)
LDL CALC: 110 mg/dL
Triglycerides: 97 mg/dL (ref 40–160)

## 2015-05-24 LAB — HEPATIC FUNCTION PANEL
ALK PHOS: 56 U/L (ref 25–125)
ALT: 15 U/L (ref 7–35)
AST: 20 U/L (ref 13–35)
BILIRUBIN, TOTAL: 0.5 mg/dL

## 2015-05-24 LAB — CBC AND DIFFERENTIAL
HCT: 36 % (ref 36–46)
Hemoglobin: 12.3 g/dL (ref 12.0–16.0)
Platelets: 255 10*3/uL (ref 150–399)
WBC: 6.8 10^3/mL

## 2015-05-24 LAB — TSH: TSH: 1.03 u[IU]/mL (ref 0.41–5.90)

## 2015-06-23 ENCOUNTER — Other Ambulatory Visit: Payer: Self-pay | Admitting: Family Medicine

## 2015-07-26 ENCOUNTER — Other Ambulatory Visit: Payer: Self-pay | Admitting: Family Medicine

## 2015-08-16 ENCOUNTER — Ambulatory Visit (INDEPENDENT_AMBULATORY_CARE_PROVIDER_SITE_OTHER): Payer: BLUE CROSS/BLUE SHIELD | Admitting: Family Medicine

## 2015-08-16 VITALS — BP 140/88 | HR 60 | Temp 98.2°F | Ht 64.0 in | Wt 155.0 lb

## 2015-08-16 DIAGNOSIS — H9201 Otalgia, right ear: Secondary | ICD-10-CM

## 2015-08-16 NOTE — Progress Notes (Signed)
Pre visit review using our clinic review tool, if applicable. No additional management support is needed unless otherwise documented below in the visit note. 

## 2015-08-16 NOTE — Progress Notes (Signed)
   Subjective:    Patient ID: Lori Love, female    DOB: [birth year], 55 y.o.   MRN: [id number]  HPI  Acute visit for right earache and right peri-auricular pains  Onset last Tuesday. She just returned from [place] but noticed her pain prior to flying and altitude change did not seem to affect this.  She does recall last week getting some food particles stuck in her lower right molars and had some gum inflammation and visible swelling. After using  Mouthwash and gargling several times this seemed to clear. She's had some pains somewhat poorly localized preauricular region. Advil helps. Denies any hearing change or ear drainage. No fevers or chills. No vertigo. No facial rash.  Denies any pain with chewing or eating  Past Medical History  Diagnosis Date  . HYPERTENSION 04/06/2009  . ASTHMA 04/06/2009  . Allergy   . UTI (urinary tract infection)   . Neck pain, chronic    Past Surgical History  Procedure Laterality Date  . Appendectomy    . Tonsillectomy      reports that she has never smoked. She does not have any smokeless tobacco history on file. Her alcohol and drug histories are not on file. family history includes Dementia in her father; Hypertension in her father and mother; Stroke in her mother. Allergies  Allergen Reactions  . Penicillins     REACTION: hives  . Sulfa Antibiotics Rash       Review of Systems  Constitutional: Negative for fever and chills.  HENT: Positive for ear pain. Negative for ear discharge and sore throat.   Eyes: Negative for visual disturbance.  Respiratory: Negative for cough.   Skin: Negative for rash.  Neurological: Negative for headaches.  Hematological: Negative for adenopathy.       Objective:   Physical Exam  Constitutional: She appears well-developed and well-nourished.  HENT:  Head: Normocephalic and atraumatic.  Right Ear: External ear normal.  Left Ear: External ear normal.  Mouth/Throat: Oropharynx is clear and moist.  No gum  edema/erythema No TMJ tenderness.  Neck: Neck supple.  Cardiovascular: Normal rate and regular rhythm.   Lymphadenopathy:    She has no cervical adenopathy.  Skin: No rash noted.          Assessment & Plan:  Right otalgia. Pain is very poorly localized. No evidence for TMJ. No evidence for dental abscess. No evidence for otitis media. She does not have symptoms to suggest likely neuralgia such as trigeminal neuralgia. She did request information on this. The fact that her pain is relatively mild and relieved with Advil argues against likely trigeminal neuralgia. No evidence for obvious infection at this time. Recommend observation. Follow-up promptly for any fever, hearing changes or other new symptoms  Eulas Post MD [facility]

## 2015-08-16 NOTE — Patient Instructions (Signed)
Trigeminal Neuralgia Trigeminal neuralgia is a nerve disorder that causes attacks of severe facial pain. The attacks last from a few seconds to several minutes. They can happen for days, weeks, or months and then go away for months or years. Trigeminal neuralgia is also called tic douloureux. CAUSES This condition is caused by damage to a nerve in the face that is called the trigeminal nerve. An attack can be triggered by:  Talking.  Chewing.  Putting on makeup.  Washing your face.  Shaving your face.  Brushing your teeth.  Touching your face. RISK FACTORS This condition is more likely to develop in:  Women.  People who are 19 years of age or older. SYMPTOMS The main symptom of this condition is pain in the jaw, lips, eyes, nose, scalp, forehead, and face. The pain may be intense, stabbing, electric, or shock-like. DIAGNOSIS This condition is diagnosed with a physical exam. A CT scan or MRI may be done to rule out other conditions that can cause facial pain. TREATMENT This condition may be treated with:  Avoiding the things that trigger your attacks.  Pain medicine.  Surgery. This may be done in severe cases if other medical treatment does not provide relief. HOME CARE INSTRUCTIONS  Take over-the-counter and prescription medicines only as told by your health care provider.  If you wish to get pregnant, talk with your health care provider before you start trying to get pregnant.  Avoid the things that trigger your attacks. It may help to:  Chew on the unaffected side of your mouth.  Avoid touching your face.  Avoid blasts of hot or cold air. SEEK MEDICAL CARE IF:  Your pain medicine is not helping.  You develop new, unexplained symptoms, such as:  Double vision.  Facial weakness.  Changes in hearing or balance.  You become pregnant. SEEK IMMEDIATE MEDICAL CARE IF:  Your pain is unbearable, and your pain medicine does not help.   This information is not  intended to replace advice given to you by your health care provider. Make sure you discuss any questions you have with your health care provider.   Document Released: 03/02/2000 Document Revised: 11/24/2014 Document Reviewed: 06/28/2014 Elsevier Interactive Patient Education 2016 [place].  Follow up for any facial weakness or progressive pain.

## 2015-08-17 ENCOUNTER — Encounter: Payer: Self-pay | Admitting: Family Medicine

## 2015-08-17 ENCOUNTER — Telehealth: Payer: Self-pay | Admitting: Family Medicine

## 2015-08-17 MED ORDER — AZITHROMYCIN 250 MG PO TABS: ORAL_TABLET | ORAL | Status: DC

## 2015-08-17 NOTE — Telephone Encounter (Signed)
I think it is unlikely this is bacterial but she did have some pressure around her sinuses.  She has allergy to PCN and sulfa.  Zithromax (Z-pack) for 5 days.

## 2015-08-17 NOTE — Telephone Encounter (Signed)
Pt is feeling worse and would like to have an antibiotic called in.    Pharm:  [facility]

## 2015-08-17 NOTE — Telephone Encounter (Signed)
Pt is aware that medication has been sent in.  

## 2015-09-01 ENCOUNTER — Encounter: Payer: Self-pay | Admitting: Family Medicine

## 2015-10-04 ENCOUNTER — Ambulatory Visit (INDEPENDENT_AMBULATORY_CARE_PROVIDER_SITE_OTHER): Payer: BLUE CROSS/BLUE SHIELD | Admitting: Family Medicine

## 2015-10-04 VITALS — BP 140/80 | HR 65 | Temp 97.9°F | Ht 64.0 in | Wt 156.0 lb

## 2015-10-04 DIAGNOSIS — I10 Essential (primary) hypertension: Secondary | ICD-10-CM

## 2015-10-04 DIAGNOSIS — D171 Benign lipomatous neoplasm of skin and subcutaneous tissue of trunk: Secondary | ICD-10-CM | POA: Diagnosis not present

## 2015-10-04 NOTE — Progress Notes (Signed)
Subjective:     Patient ID: Lori Love, female   DOB: 11/09/1960, 55 y.o.   MRN: [id number]  HPI Pt here for follow up lipoma right upper back Has been noted previously Had recent massage and massage therapist thought this had grown some.   No pain  Hypertension treated with low dose beta blocker. She has not monitored BP recently at home. Exercises regularly Strong FH of hypertension.  Past Medical History  Diagnosis Date  . HYPERTENSION 04/06/2009  . ASTHMA 04/06/2009  . Allergy   . UTI (urinary tract infection)   . Neck pain, chronic    Past Surgical History  Procedure Laterality Date  . Appendectomy    . Tonsillectomy      reports that she has never smoked. She does not have any smokeless tobacco history on file. Her alcohol and drug histories are not on file. family history includes Dementia in her father; Hypertension in her father and mother; Stroke in her mother. Allergies  Allergen Reactions  . Penicillins     REACTION: hives  . Sulfa Antibiotics Rash     Review of Systems  Constitutional: Negative for fatigue.  Eyes: Negative for visual disturbance.  Respiratory: Negative for cough, chest tightness, shortness of breath and wheezing.   Cardiovascular: Negative for chest pain, palpitations and leg swelling.  Neurological: Negative for dizziness, seizures, syncope, weakness, light-headedness and headaches.       Objective:   Physical Exam  Constitutional: She appears well-developed and well-nourished.  Cardiovascular: Normal rate and regular rhythm.   Pulmonary/Chest: Effort normal and breath sounds normal. No respiratory distress. She has no wheezes. She has no rales.  Skin:  Over right scapula region 4 X 4 cm fatty lipomatous mass Non-tender.       Assessment:     #1 benign appearing lipoma right upper back  #2 Hypertension- slightly elevated today    Plan:     -reassurance regarding lipoma.  Follow up for any pain, rapid growth, etc -monitor  blood pressure at home and be in touch if > 140/90 consistently.  Eulas Post MD [facility] at [facility]

## 2015-10-04 NOTE — Progress Notes (Signed)
Pre visit review using our clinic review tool, if applicable. No additional management support is needed unless otherwise documented below in the visit note. 

## 2015-10-04 NOTE — Patient Instructions (Signed)
Monitor blood pressure and be in touch if consistently > 140/90.   

## 2015-10-10 ENCOUNTER — Ambulatory Visit (INDEPENDENT_AMBULATORY_CARE_PROVIDER_SITE_OTHER): Payer: BLUE CROSS/BLUE SHIELD | Admitting: Family Medicine

## 2015-10-10 VITALS — BP 140/90 | HR 85 | Temp 98.0°F | Ht 64.0 in | Wt 157.0 lb

## 2015-10-10 DIAGNOSIS — I10 Essential (primary) hypertension: Secondary | ICD-10-CM | POA: Diagnosis not present

## 2015-10-10 MED ORDER — LOSARTAN POTASSIUM 50 MG PO TABS: 50.0000 mg | ORAL_TABLET | Freq: Every day | ORAL | 6 refills | Status: DC

## 2015-10-10 NOTE — Patient Instructions (Signed)
Reduce wine to no more than 12 ounces per day Go ahead and start Losartan Stay well hydrated.

## 2015-10-10 NOTE — Progress Notes (Signed)
Pre visit review using our clinic review tool, if applicable. No additional management support is needed unless otherwise documented below in the visit note. 

## 2015-10-10 NOTE — Progress Notes (Signed)
Subjective:     Patient ID: Lori Love, female   DOB: [birth year], 55 y.o.   MRN: [id number]  HPI Here to evaluate elevated blood pressure She has history of hypertension and currently takes metoprolol extended release 25 mg one and one half tablets daily. Yesterday she went to exercise class but felt poorly. She had some headaches and after exercising about an hour later took blood pressure 150/95. She took this several minutes later and obtain reading 171/110. She felt very anxious at that point and took low-dose Xanax and subsequently her blood pressure came down.  [name] very active with exercise several times per week. Does drink about 18 ounces of wine per day. No recent dietary changes  Past Medical History:  Diagnosis Date  . Allergy   . ASTHMA 04/06/2009  . HYPERTENSION 04/06/2009  . Neck pain, chronic   . UTI (urinary tract infection)    Past Surgical History:  Procedure Laterality Date  . APPENDECTOMY    . TONSILLECTOMY      reports that she has never smoked. She does not have any smokeless tobacco history on file. Her alcohol and drug histories are not on file. family history includes Dementia in her father; Hypertension in her father and mother; Stroke in her mother. Allergies  Allergen Reactions  . Penicillins     REACTION: hives  . Sulfa Antibiotics Rash     Review of Systems  Constitutional: Negative for fatigue.  Eyes: Negative for visual disturbance.  Respiratory: Negative for cough, chest tightness, shortness of breath and wheezing.   Cardiovascular: Negative for chest pain, palpitations and leg swelling.  Neurological: Negative for dizziness, seizures, syncope, weakness and light-headedness.       Objective:   Physical Exam  Constitutional: She appears well-developed and well-nourished.  Eyes: Pupils are equal, round, and reactive to light.  Neck: Neck supple. No JVD present. No thyromegaly present.  Cardiovascular: Normal rate and regular rhythm.   Exam reveals no gallop.   Pulmonary/Chest: Effort normal and breath sounds normal. No respiratory distress. She has no wheezes. She has no rales.  Musculoskeletal: She exhibits no edema.  Neurological: [name] alert.       Assessment:     Hypertension. Poorly controlled over the past several days. She seems to have fairly labile hypertension.    Plan:     -Reduce wine to no more than 10 ounces per day -Start losartan 50 mg once daily -Continue low-dose metoprolol -Reassess blood pressure within 4 weeks     Eulas Post MD [facility] at [facility]

## 2015-11-14 ENCOUNTER — Ambulatory Visit (INDEPENDENT_AMBULATORY_CARE_PROVIDER_SITE_OTHER): Payer: BLUE CROSS/BLUE SHIELD | Admitting: Family Medicine

## 2015-11-14 VITALS — BP 110/70 | HR 63 | Temp 98.1°F

## 2015-11-14 DIAGNOSIS — I10 Essential (primary) hypertension: Secondary | ICD-10-CM

## 2015-11-14 LAB — BASIC METABOLIC PANEL
BUN: 14 mg/dL (ref 6–23)
CHLORIDE: 104 meq/L (ref 96–112)
CO2: 26 mEq/L (ref 19–32)
CREATININE: 0.85 mg/dL (ref 0.40–1.20)
Calcium: 9.3 mg/dL (ref 8.4–10.5)
GFR: 74.34 mL/min (ref >60.00)
GLUCOSE: 96 mg/dL (ref 70–99)
POTASSIUM: 4.7 meq/L (ref 3.5–5.1)
Sodium: 136 mEq/L (ref 135–145)

## 2015-11-14 NOTE — Progress Notes (Signed)
Subjective:     Patient ID: Lori Love, female   DOB: 30-Mar-1960, 55 y.o.   MRN: [id number]  HPI Follow-up hypertension. Refer to most recent note. Patient had several elevated readings. She had already been on low-dose metoprolol. We added losartan 50 mg once daily. Tolerating well no side effects. Home blood pressures been improved. Feels better overall. Less fatigue. Stays very active with exercising several times per week. No headaches. No dizziness.  Past Medical History:  Diagnosis Date  . Allergy   . ASTHMA 04/06/2009  . HYPERTENSION 04/06/2009  . Neck pain, chronic   . UTI (urinary tract infection)    Past Surgical History:  Procedure Laterality Date  . APPENDECTOMY    . TONSILLECTOMY      reports that she has never smoked. She does not have any smokeless tobacco history on file. Her alcohol and drug histories are not on file. family history includes Dementia in her father; Hypertension in her father and mother; Stroke in her mother. Allergies  Allergen Reactions  . Penicillins     REACTION: hives  . Sulfa Antibiotics Rash     Review of Systems  Constitutional: Negative for fatigue.  Eyes: Negative for visual disturbance.  Respiratory: Negative for cough, chest tightness, shortness of breath and wheezing.   Cardiovascular: Negative for chest pain, palpitations and leg swelling.  Neurological: Negative for dizziness, seizures, syncope, weakness, light-headedness and headaches.       Objective:   Physical Exam  Constitutional: She appears well-developed and well-nourished.  Cardiovascular: Normal rate and regular rhythm.   Pulmonary/Chest: Effort normal and breath sounds normal. No respiratory distress. She has no wheezes. She has no rales.  Musculoskeletal: She exhibits no edema.       Assessment:     Hypertension. Improved.    Plan:     -Continue low-dose losartan -Continue regular exercise habits -We discussed eventually consider taper off metoprolol  but would not make any further changes at this time. -Check basic metabolic panel with recent initiation of ARB.  Eulas Post MD [facility] at [facility]

## 2015-11-14 NOTE — Progress Notes (Signed)
Pre visit review using our clinic review tool, if applicable. No additional management support is needed unless otherwise documented below in the visit note. 

## 2015-11-22 ENCOUNTER — Other Ambulatory Visit: Payer: Self-pay | Admitting: Family Medicine

## 2015-11-23 NOTE — Telephone Encounter (Signed)
Rx refill sent to pharmacy. 

## 2015-12-08 ENCOUNTER — Other Ambulatory Visit: Payer: Self-pay | Admitting: Family Medicine

## 2015-12-09 NOTE — Telephone Encounter (Signed)
Refill once 

## 2016-01-25 ENCOUNTER — Ambulatory Visit (INDEPENDENT_AMBULATORY_CARE_PROVIDER_SITE_OTHER): Payer: BLUE CROSS/BLUE SHIELD | Admitting: Family Medicine

## 2016-01-25 VITALS — BP 140/80 | HR 92 | Temp 98.0°F

## 2016-01-25 DIAGNOSIS — J019 Acute sinusitis, unspecified: Secondary | ICD-10-CM | POA: Diagnosis not present

## 2016-01-25 MED ORDER — DOXYCYCLINE HYCLATE 100 MG PO CAPS: 100.0000 mg | ORAL_CAPSULE | Freq: Two times a day (BID) | ORAL | 0 refills | Status: DC

## 2016-01-25 NOTE — Progress Notes (Signed)
Subjective:     Patient ID: Lori Love, female   DOB: Jan 23, 1961, 55 y.o.   MRN: [id number]  HPI Patient seen with concern for acute sinusitis. About 11 days ago she developed some typical URI symptoms with nasal congestion sore throat postnasal drip. Over the past few days she has noted some thickening of her mucus and yellow color changes. She's had some frontal sinus headaches and intermittent facial pain. Increased malaise. She's been doing saline nasal irrigation and also using nasal steroid along with antihistamine without improvement. Current symptoms are typical of previous sinus infections. She feels worse than she did a week ago.  Past Medical History:  Diagnosis Date  . Allergy   . ASTHMA 04/06/2009  . HYPERTENSION 04/06/2009  . Neck pain, chronic   . UTI (urinary tract infection)    Past Surgical History:  Procedure Laterality Date  . APPENDECTOMY    . TONSILLECTOMY      reports that she has never smoked. She does not have any smokeless tobacco history on file. Her alcohol and drug histories are not on file. family history includes Dementia in her father; Hypertension in her father and mother; Stroke in her mother. Allergies  Allergen Reactions  . Penicillins     REACTION: hives  . Sulfa Antibiotics Rash     Review of Systems  Constitutional: Positive for fatigue. Negative for chills and fever.  HENT: Positive for sinus pain, sinus pressure and sore throat.   Respiratory: Positive for cough.   Neurological: Positive for headaches. Negative for dizziness.       Objective:   Physical Exam  Constitutional: She appears well-developed and well-nourished.  HENT:  Right Ear: External ear normal.  Left Ear: External ear normal.  Mouth/Throat: Oropharynx is clear and moist.  Neck: Neck supple.  Cardiovascular: Normal rate and regular rhythm.   Pulmonary/Chest: Effort normal and breath sounds normal. No respiratory distress. She has no wheezes. She has no rales.   Lymphadenopathy:    She has no cervical adenopathy.       Assessment:     Acute sinusitis. Unimproved with conservative measures    Plan:     -She has taken Zithromax multiple times in the past and we explained there is more resistance with Zithromax -Continue with increased hydration and consider over-the-counter plain Mucinex -Start doxycycline 100 mg twice daily for 10 days and reviewed potential side effects including photosensitivity  Eulas Post MD [facility] at [facility]

## 2016-01-25 NOTE — Patient Instructions (Signed)

## 2016-01-25 NOTE — Progress Notes (Signed)
Pre visit review using our clinic review tool, if applicable. No additional management support is needed unless otherwise documented below in the visit note. 

## 2016-05-04 ENCOUNTER — Other Ambulatory Visit: Payer: Self-pay | Admitting: Family Medicine

## 2016-05-17 ENCOUNTER — Other Ambulatory Visit: Payer: Self-pay | Admitting: Family Medicine

## 2016-05-23 ENCOUNTER — Other Ambulatory Visit: Payer: Self-pay | Admitting: Family Medicine

## 2016-05-23 ENCOUNTER — Telehealth: Payer: Self-pay

## 2016-05-23 NOTE — Telephone Encounter (Signed)
Received [facility] for Leval[facility]. PA submitted & is pending. Key: Gwenlyn Fudge

## 2016-05-23 NOTE — Telephone Encounter (Signed)
Last refill 12/09/15.  Last office visit 01/25/16.  Okay to refill?

## 2016-05-24 ENCOUNTER — Telehealth: Payer: Self-pay | Admitting: *Deleted

## 2016-05-24 MED ORDER — ALBUTEROL SULFATE HFA 108 (90 BASE) MCG/ACT IN AERS: 1.0000 | INHALATION_SPRAY | RESPIRATORY_TRACT | 0 refills | Status: DC | PRN

## 2016-05-24 NOTE — Telephone Encounter (Signed)
I called the pt and informed her of this and she is aware the Rx was sent to her pharmacy.

## 2016-05-24 NOTE — Telephone Encounter (Signed)
Also would take the Alprazolam off her list.

## 2016-05-24 NOTE — Addendum Note (Signed)
Addended by: Agnes Lawrence on: 05/24/2016 03:45 PM   Modules accepted: Orders

## 2016-05-24 NOTE — Telephone Encounter (Signed)
PA denied, patient has to try Proair or Proair Respiclick.

## 2016-05-24 NOTE — Telephone Encounter (Signed)
She does not take regularly.  I really feel best to avoid benzodiazepines.  I will be happy to have her in to discuss.  If anxiety is recurrent and becoming more persistent I would consider better/safer long range alternatives.

## 2016-05-24 NOTE — Telephone Encounter (Signed)
If they will not cover Xopenex would have to look at alternative such as ProAir 1-2 puffs every 4 hours prn wheezing.

## 2016-05-24 NOTE — Telephone Encounter (Signed)
Patient's insurance will not cover the prescription for Xopenex.  Patient requests a new prescription. [facility]

## 2016-05-25 ENCOUNTER — Ambulatory Visit (INDEPENDENT_AMBULATORY_CARE_PROVIDER_SITE_OTHER): Payer: BLUE CROSS/BLUE SHIELD | Admitting: Family Medicine

## 2016-05-25 ENCOUNTER — Encounter: Payer: Self-pay | Admitting: Family Medicine

## 2016-05-25 VITALS — BP 140/90 | HR 60 | Temp 97.8°F | Wt 162.6 lb

## 2016-05-25 DIAGNOSIS — J019 Acute sinusitis, unspecified: Secondary | ICD-10-CM | POA: Diagnosis not present

## 2016-05-25 MED ORDER — AZITHROMYCIN 250 MG PO TABS: ORAL_TABLET | ORAL | 0 refills | Status: AC

## 2016-05-25 MED ORDER — ALPRAZOLAM 0.5 MG PO TABS: 0.5000 mg | ORAL_TABLET | Freq: Every evening | ORAL | 0 refills | Status: DC | PRN

## 2016-05-25 NOTE — Telephone Encounter (Signed)
Left message on machine for patient to return our call 

## 2016-05-25 NOTE — Progress Notes (Signed)
Pre visit review using our clinic review tool, if applicable. No additional management support is needed unless otherwise documented below in the visit note. 

## 2016-05-25 NOTE — Progress Notes (Signed)
Subjective:     Patient ID: Lori Love, female   DOB: 1961-02-16, 56 y.o.   MRN: [id number]  HPI Recent seen with concern for possible "sinusitis ". He's had multiple infections in the past. Her current symptoms started about a week ago. She said she started initial cold-like symptoms now have progressive pressure mostly maxillary sinuses bilaterally. She initially had some clear drainage but is not getting much drainage at all right now. She's had some upper teeth pain. She had some leftover doxycycline which she started 3 days ago but has not seen any improvement. Increased malaise. She still played tennis earlier today. Occasional headaches. Symptoms are worse at night. She's using saline irrigation along with hot showers and increased hydration without much improvement  Past Medical History:  Diagnosis Date  . Allergy   . ASTHMA 04/06/2009  . HYPERTENSION 04/06/2009  . Neck pain, chronic   . UTI (urinary tract infection)    Past Surgical History:  Procedure Laterality Date  . APPENDECTOMY    . TONSILLECTOMY      reports that she has never smoked. She has never used smokeless tobacco. Her alcohol and drug histories are not on file. family history includes Dementia in her father; Hypertension in her father and mother; Stroke in her mother. Allergies  Allergen Reactions  . Penicillins     REACTION: hives  . Sulfa Antibiotics Rash     Review of Systems  Constitutional: Positive for fatigue. Negative for chills and fever.  HENT: Positive for congestion, sinus pain and sinus pressure.   Respiratory: Negative for cough.        Objective:   Physical Exam  Constitutional: She appears well-developed and well-nourished.  HENT:  Right Ear: External ear normal.  Left Ear: External ear normal.  Mouth/Throat: Oropharynx is clear and moist.  Neck: Neck supple.  Cardiovascular: Normal rate and regular rhythm.   Pulmonary/Chest: Effort normal and breath sounds normal. No respiratory  distress. She has no wheezes. She has no rales.  Lymphadenopathy:    She has no cervical adenopathy.       Assessment:     Possible acute sinusitis    Plan:     Start Zithromax and treat for 5 days Continue good hydration and saline nasal irrigation  Eulas Post MD [facility] at [facility]

## 2016-06-02 ENCOUNTER — Other Ambulatory Visit: Payer: Self-pay | Admitting: Family Medicine

## 2016-07-03 ENCOUNTER — Other Ambulatory Visit: Payer: Self-pay | Admitting: Family Medicine

## 2016-07-17 ENCOUNTER — Encounter: Payer: Self-pay | Admitting: Family Medicine

## 2016-07-17 ENCOUNTER — Ambulatory Visit (INDEPENDENT_AMBULATORY_CARE_PROVIDER_SITE_OTHER): Payer: BLUE CROSS/BLUE SHIELD | Admitting: Family Medicine

## 2016-07-17 VITALS — BP 122/80 | HR 64 | Temp 98.4°F | Ht 64.5 in | Wt 155.9 lb

## 2016-07-17 DIAGNOSIS — Z0000 Encounter for general adult medical examination without abnormal findings: Secondary | ICD-10-CM

## 2016-07-17 DIAGNOSIS — Z1211 Encounter for screening for malignant neoplasm of colon: Secondary | ICD-10-CM | POA: Diagnosis not present

## 2016-07-17 LAB — HEPATIC FUNCTION PANEL
ALBUMIN: 4.7 g/dL (ref 3.5–5.2)
ALT: 10 U/L (ref 0–35)
AST: 17 U/L (ref 0–37)
Alkaline Phosphatase: 50 U/L (ref 39–117)
Bilirubin, Direct: 0.1 mg/dL (ref 0.0–0.3)
TOTAL PROTEIN: 7.6 g/dL (ref 6.0–8.3)
Total Bilirubin: 0.6 mg/dL (ref 0.2–1.2)

## 2016-07-17 LAB — CBC WITH DIFFERENTIAL/PLATELET
BASOS PCT: 0.6 % (ref 0.0–3.0)
Basophils Absolute: 0 10*3/uL (ref 0.0–0.1)
EOS PCT: 2.1 % (ref 0.0–5.0)
Eosinophils Absolute: 0.1 10*3/uL (ref 0.0–0.7)
HEMATOCRIT: 37.5 % (ref 36.0–46.0)
Hemoglobin: 12.6 g/dL (ref 12.0–15.0)
LYMPHS ABS: 1.6 10*3/uL (ref 0.7–4.0)
Lymphocytes Relative: 22.9 % (ref 12.0–46.0)
MCHC: 33.6 g/dL (ref 30.0–36.0)
MCV: 95.7 fl (ref 78.0–100.0)
MONOS PCT: 8.5 % (ref 3.0–12.0)
Monocytes Absolute: 0.6 10*3/uL (ref 0.1–1.0)
NEUTROS ABS: 4.5 10*3/uL (ref 1.4–7.7)
NEUTROS PCT: 65.9 % (ref 43.0–77.0)
PLATELETS: 281 10*3/uL (ref 150.0–400.0)
RBC: 3.92 Mil/uL (ref 3.87–5.11)
RDW: 12.9 % (ref 11.5–15.5)
WBC: 6.9 10*3/uL (ref 4.0–10.5)

## 2016-07-17 LAB — BASIC METABOLIC PANEL
BUN: 14 mg/dL (ref 6–23)
CHLORIDE: 102 meq/L (ref 96–112)
CO2: 28 meq/L (ref 19–32)
Calcium: 9.8 mg/dL (ref 8.4–10.5)
Creatinine, Ser: 0.92 mg/dL (ref 0.40–1.20)
GFR: 67.68 mL/min (ref >60.00)
GLUCOSE: 95 mg/dL (ref 70–99)
POTASSIUM: 4.6 meq/L (ref 3.5–5.1)
SODIUM: 136 meq/L (ref 135–145)

## 2016-07-17 LAB — VITAMIN D 25 HYDROXY (VIT D DEFICIENCY, FRACTURES): VITD: 28.85 ng/mL — ABNORMAL LOW (ref 30.00–100.00)

## 2016-07-17 LAB — LIPID PANEL
CHOL/HDL RATIO: 2
Cholesterol: 240 mg/dL — ABNORMAL HIGH (ref 0–200)
HDL: 102.6 mg/dL (ref >39.00)
LDL CALC: 118 mg/dL — AB (ref 0–99)
NONHDL: 137.42
Triglycerides: 98 mg/dL (ref 0.0–149.0)
VLDL: 19.6 mg/dL (ref 0.0–40.0)

## 2016-07-17 LAB — TSH: TSH: 1.63 u[IU]/mL (ref 0.35–4.50)

## 2016-07-17 NOTE — Progress Notes (Signed)
Pre visit review using our clinic review tool, if applicable. No additional management support is needed unless otherwise documented below in the visit note. 

## 2016-07-17 NOTE — Patient Instructions (Signed)
Check on Cologuard and let us know if interested. Let me know if Dr Sarajane Jews is the dermatologist you are interested in seeing.

## 2016-07-17 NOTE — Progress Notes (Signed)
Subjective:     Patient ID: Lori Love, female   DOB: June 22, 1960, 56 y.o.   MRN: [id number]  HPI Patient seen for physical exam. She sees gynecologist in [place] every year and Pap smear and mammogram up-to-date. She has never had colon cancer screening. Her tetanus is up-to-date. She exercises regularly with tennis. History of low vitamin D and takes regular calcium and vitamin D supplement.  She has soft tissue growth right upper thoracic area near her scapula. This has been growing some over the past year. Was noticed initially by her massage therapist. She occasionally has pain with things like pushups and swinging a tennis racquet . [name] interested in exploring getting this excised.  Progressive spider type veins in her ankles and lower legs. Requesting referral for therapy  Past Medical History:  Diagnosis Date  . Allergy   . ASTHMA 04/06/2009  . HYPERTENSION 04/06/2009  . Neck pain, chronic   . UTI (urinary tract infection)    Past Surgical History:  Procedure Laterality Date  . APPENDECTOMY    . TONSILLECTOMY      reports that she has never smoked. She has never used smokeless tobacco. Her alcohol and drug histories are not on file. family history includes Dementia in her father; Hypertension in her father and mother; Stroke in her mother. Allergies  Allergen Reactions  . Penicillins     REACTION: hives  . Sulfa Antibiotics Rash     Review of Systems  Constitutional: Negative for activity change, appetite change, fatigue, fever and unexpected weight change.  HENT: Negative for ear pain, hearing loss, sore throat and trouble swallowing.   Eyes: Negative for visual disturbance.  Respiratory: Negative for cough and shortness of breath.   Cardiovascular: Negative for chest pain and palpitations.  Gastrointestinal: Negative for abdominal pain, blood in stool, constipation and diarrhea.  Genitourinary: Negative for dysuria and hematuria.  Musculoskeletal: Negative for  arthralgias, back pain and myalgias.  Skin: Negative for rash.  Neurological: Negative for dizziness, syncope and headaches.  Hematological: Negative for adenopathy.  Psychiatric/Behavioral: Negative for confusion and dysphoric mood.       Objective:   Physical Exam  Constitutional: [name] oriented to person, place, and time. She appears well-developed and well-nourished.  HENT:  Head: Normocephalic and atraumatic.  Eyes: EOM are normal. Pupils are equal, round, and reactive to light.  Neck: Normal range of motion. Neck supple. No thyromegaly present.  Cardiovascular: Normal rate, regular rhythm and normal heart sounds.   No murmur heard. Pulmonary/Chest: Breath sounds normal. No respiratory distress. She has no wheezes. She has no rales.  Abdominal: Soft. Bowel sounds are normal. She exhibits no distension and no mass. There is no tenderness. There is no rebound and no guarding.  Musculoskeletal: Normal range of motion. She exhibits no edema.  Lymphadenopathy:    She has no cervical adenopathy.  Neurological: [name] alert and oriented to person, place, and time. She displays normal reflexes. No cranial nerve deficit.  Skin: No rash noted.  She has some small spider type veins ankle region bilaterally  Soft nontender mobile fatty type consistency subcutaneous mass near her right scapular region-no overlying skin changes  Psychiatric: She has a normal mood and affect. Her behavior is normal. Judgment and thought content normal.       Assessment:     Physical exam. Patient is generally very healthy. [name] regular GYN follow-up. History of low vitamin D. She has spider veins on her lower legs and is  requesting treatment. Also has lipomatous type mass right upper back which is becoming somewhat more symptomatic in terms of some pain recently    Plan:     -Obtain lab work and include 25-hydroxy vitamin D level -Continue regular calcium and vitamin D supplementation -She will  continue with regular GYN follow-up -Discussed colon cancer screening. She declines colonoscopy but is willing to consider DNA base stool testing -Check hepatitis C antibody but patient is low risk -Discussed referral to dermatologist (surgical specialist) for evaluation and possible surgical excision of right upper back mass-suspect benign lipoma -Patient is considering referral to vein specialist regarding her spider veins  Eulas Post MD [facility] at [facility]

## 2016-07-18 LAB — HEPATITIS C ANTIBODY: HCV Ab: NEGATIVE

## 2016-08-14 LAB — COLOGUARD: COLOGUARD: NEGATIVE

## 2016-08-22 ENCOUNTER — Encounter: Payer: Self-pay | Admitting: Family Medicine

## 2016-08-28 ENCOUNTER — Telehealth: Payer: Self-pay | Admitting: Family Medicine

## 2016-08-28 NOTE — Telephone Encounter (Signed)
Pt returned your call.  

## 2016-08-28 NOTE — Telephone Encounter (Signed)
Spoke with patient.

## 2016-09-06 ENCOUNTER — Ambulatory Visit (INDEPENDENT_AMBULATORY_CARE_PROVIDER_SITE_OTHER): Payer: BLUE CROSS/BLUE SHIELD | Admitting: Podiatry

## 2016-09-06 ENCOUNTER — Encounter: Payer: Self-pay | Admitting: Podiatry

## 2016-09-06 ENCOUNTER — Ambulatory Visit: Payer: BLUE CROSS/BLUE SHIELD

## 2016-09-06 DIAGNOSIS — S8991XA Unspecified injury of right lower leg, initial encounter: Secondary | ICD-10-CM | POA: Diagnosis not present

## 2016-09-06 DIAGNOSIS — M79674 Pain in right toe(s): Secondary | ICD-10-CM

## 2016-09-06 DIAGNOSIS — M7751 Other enthesopathy of right foot: Secondary | ICD-10-CM

## 2016-09-06 DIAGNOSIS — S99921A Unspecified injury of right foot, initial encounter: Secondary | ICD-10-CM

## 2016-09-06 MED ORDER — MELOXICAM 15 MG PO TABS: 15.0000 mg | ORAL_TABLET | Freq: Every day | ORAL | 2 refills | Status: DC

## 2016-09-07 ENCOUNTER — Telehealth: Payer: Self-pay

## 2016-09-07 NOTE — Telephone Encounter (Signed)
Spoke with patient in regards to meloxicam use. She expressed concern with side effects and BP/ MIA, advised her to start with just half (7.5mg ) and monitor her s/s of hypertension. Any s/s of hypertension she is to d/c medication immediately

## 2016-09-10 NOTE — Progress Notes (Signed)
Subjective:    Patient ID: Lori Love, female   DOB: 56 y.o.   MRN: [id number]   HPI 56 year old female presents the office today for concerns of right foot pain which is been ongoing for about 2 months. She states that her second third toes of become more separated she gets pain in the second toe in between the second and third toe. She did have an x-ray preformed which are negative and she has had an MRI performed. This was done at [facility]. She denies any recent injury or trauma. She said that she has no pain and she wears cushion she has been she goes barefoot on her surface of were flat shoes this is when she gets pain. [name] able to play tennis without any significant pain as well. She denies any specific injury or trauma. She has no numbness or tingling to her toes. The pain does not wake her up at night. She has no other concerns today.   Review of Systems  All other systems reviewed and are negative.       Objective:  Physical Exam General: AAO x3, NAD  Dermatological: Skin is warm, dry and supple bilateral. Nails x 10 are well manicured; remaining integument appears unremarkable at this time. There are no open sores, no preulcerative lesions, no rash or signs of infection present.  Vascular: Dorsalis Pedis artery and Posterior Tibial artery pedal pulses are 2/4 bilateral with immedate capillary fill time. Pedal hair growth present.  There is no pain with calf compression, swelling, warmth, erythema.   Neruologic: Grossly intact via light touch bilateral. Vibratory intact via tuning fork bilateral. Protective threshold with Semmes Wienstein monofilament intact to all pedal sites bilateral.   Musculoskeletal: There is no palpable neuroma identified the second interspace or to other areas of the foot. She has no numbness or tingling to her toes. There is no area pinpoint bony tenderness or pain the vibratory sensation. There is a mild discomfort the second MPJ. There is also  some mild discomfort on the plantar aspect of the digit on the course of the flexor tendon/plantar plate. There is no gross instability present in the second MPJ. Upon weightbearing the second and third toes due to the period there is currently no edema but she does state when this started she had swelling to the area. There is no erythema or increase in warmth. There is no other area of tenderness within this time. Muscular strength 5/5 in all groups tested bilateral.  Gait: Unassisted, Nonantalgic.      Assessment:     56 year old female with bursitis, possible plantar plate injury right foot    Plan:  -Treatment options discussed including all alternatives, risks, and complications -Etiology of symptoms were discussed -I reviewed the x-rays as well as the MRI. X-rays are negative. Upon my evaluation of the MRI there does appear to be some fluid on the plantar aspect of the second toe likely on the plantar plate concerning for injury as well as some fluid in the second interspace which I'll think is specific. Alternative the MRI report. -Prescribed mobic. Discussed side effects of the medication and directed to stop if any are to occur and call the office.  -Discussed how to hold the toe down somewhat plantar flexed position/rectus position to help heal the plantar plate. -Patient's continuing discuss a steroid injection however hasn't concerned about a possible tear or inflammation plantar plate hold off on steroid injection. -Discussed custom orthotics as well as shoe gear  changes. -Follow-up as scheduled. Call any questions or concerns.   Celesta Gentile, DPM

## 2016-09-27 ENCOUNTER — Ambulatory Visit: Payer: BLUE CROSS/BLUE SHIELD | Admitting: Podiatry

## 2016-10-01 ENCOUNTER — Telehealth: Payer: Self-pay | Admitting: Podiatry

## 2016-10-01 NOTE — Telephone Encounter (Signed)
The medication that Dr. Jacqualyn Posey prescribed on 21 June has did not work with my blood pressure. Also, the toe splint he prescribed a little toe splint and after spending a small fortune nothing has helped. He stated the next course of treatment would be cortisone injections. I want to get that scheduled. I was wondering how that works, will it be another visit because I've been having to pay $260 every time I come in or could I just come in for the injection. If you could, please call me back at [phone]. Thank you.

## 2016-10-11 ENCOUNTER — Ambulatory Visit: Payer: BLUE CROSS/BLUE SHIELD | Admitting: Podiatry

## 2016-10-11 ENCOUNTER — Telehealth: Payer: Self-pay | Admitting: Podiatry

## 2016-10-11 NOTE — Telephone Encounter (Signed)
I called pt back in regards to her question on if she should keep her appointment today. Pt did not answer so I left her a voicemail saying we would be happy to still see her today. I explained that I spoke to my supervisor Darrick Penna who stated she has a $2,250 deductible. I told the pt we would go ahead and file with insurance and whatever they leave her with she would be responsible to pay but that we could get her set up on a payment plan if that would help her. I asked pt to call me back at my direct number.

## 2016-10-24 ENCOUNTER — Other Ambulatory Visit: Payer: Self-pay | Admitting: Family Medicine

## 2016-11-06 ENCOUNTER — Telehealth: Payer: Self-pay | Admitting: Family Medicine

## 2016-11-06 NOTE — Telephone Encounter (Signed)
Spoke to patient and gave her the phone number to billing with [facility].

## 2016-11-06 NOTE — Telephone Encounter (Signed)
Insurance is calling to see if there was a consent form filled out to be able to do the colog[facility].  Bri w/insurance company state that the pt is not aware that this was being sent out to a lab that is not in network.

## 2016-11-07 ENCOUNTER — Telehealth: Payer: Self-pay | Admitting: Family Medicine

## 2016-11-07 NOTE — Telephone Encounter (Signed)
Already spoken to patient and patient will call [facility] billing department.

## 2016-11-07 NOTE — Telephone Encounter (Signed)
Bri for [facility] would like a callback concerning [facility].test,  bri said [facility] is out of pt network and would like to know if md was aware of this. Bri said pt was under the impression test was sent to our regular lab.

## 2017-01-16 ENCOUNTER — Other Ambulatory Visit: Payer: Self-pay | Admitting: Family Medicine

## 2017-01-17 NOTE — Telephone Encounter (Signed)
She should be ok until Dr. B gets back. I will let him decide if she should continue this

## 2017-01-20 ENCOUNTER — Other Ambulatory Visit: Payer: Self-pay | Admitting: Family Medicine

## 2017-01-20 NOTE — Telephone Encounter (Signed)
Refill once 

## 2017-01-21 NOTE — Telephone Encounter (Signed)
Rx done. 

## 2017-02-01 ENCOUNTER — Other Ambulatory Visit: Payer: Self-pay | Admitting: Family Medicine

## 2017-04-09 ENCOUNTER — Encounter: Payer: Self-pay | Admitting: Family Medicine

## 2017-04-09 ENCOUNTER — Ambulatory Visit (INDEPENDENT_AMBULATORY_CARE_PROVIDER_SITE_OTHER): Payer: Managed Care, Other (non HMO) | Admitting: Family Medicine

## 2017-04-09 VITALS — BP 130/70 | HR 61 | Temp 97.9°F | Ht 64.5 in

## 2017-04-09 DIAGNOSIS — I10 Essential (primary) hypertension: Secondary | ICD-10-CM | POA: Diagnosis not present

## 2017-04-09 DIAGNOSIS — R002 Palpitations: Secondary | ICD-10-CM | POA: Diagnosis not present

## 2017-04-09 NOTE — Patient Instructions (Signed)
Keep exercising Scale wine intake back to no more than 10 ounces per day Keep caffeine intake down.  Palpitations A palpitation is the feeling that your heartbeat is irregular or is faster than normal. It may feel like your heart is fluttering or skipping a beat. Palpitations are usually not a serious problem. They may be caused by many things, including smoking, caffeine, alcohol, stress, and certain medicines. Although most causes of palpitations are not serious, palpitations can be a sign of a serious medical problem. In some cases, you may need further medical evaluation. Follow these instructions at home: Pay attention to any changes in your symptoms. Take these actions to help with your condition:  Avoid the following: ? Caffeinated coffee, tea, soft drinks, diet pills, and energy drinks. ? Chocolate. ? Alcohol.  Do not use any tobacco products, such as cigarettes, chewing tobacco, and e-cigarettes. If you need help quitting, ask your health care provider.  Try to reduce your stress and anxiety. Things that can help you relax include: ? Yoga. ? Meditation. ? Physical activity, such as swimming, jogging, or walking. ? Biofeedback. This is a method that helps you learn to use your mind to control things in your body, such as your heartbeats.  Get plenty of rest and sleep.  Take over-the-counter and prescription medicines only as told by your health care provider.  Keep all follow-up visits as told by your health care provider. This is important.  Contact a health care provider if:  You continue to have a fast or irregular heartbeat after 24 hours.  Your palpitations occur more often. Get help right away if:  You have chest pain or shortness of breath.  You have a severe headache.  You feel dizzy or you faint. This information is not intended to replace advice given to you by your health care provider. Make sure you discuss any questions you have with your health care  provider. Document Released: 03/02/2000 Document Revised: 08/08/2015 Document Reviewed: 11/18/2014 Elsevier Interactive Patient Education  [employer].

## 2017-04-09 NOTE — Progress Notes (Signed)
Subjective:     Patient ID: Lori Love, female   DOB: 1961-01-23, 57 y.o.   MRN: [id number]  HPI Patient history of hypertension as had palpitations the past. She has been more diligent with exercise recently and had reduced some home stressors and tried to decrease her metoprolol dose to half tablet. She noticed an increased fluttering sensation of her heart especially at night. She continues to exercise most days of the week had no intolerances exercise. No chest pains. No syncope. No dizziness.  She drinks 1-2 cups of coffee in the morning and usually about 3 glasses of wine per day  Hypertension treated with losartan and remains on losartan daily. Compliant with therapy. No recent headaches.  Past Medical History:  Diagnosis Date  . Allergy   . ASTHMA 04/06/2009  . HYPERTENSION 04/06/2009  . Neck pain, chronic   . UTI (urinary tract infection)    Past Surgical History:  Procedure Laterality Date  . APPENDECTOMY    . TONSILLECTOMY      reports that  has never smoked. she has never used smokeless tobacco. Her alcohol and drug histories are not on file. family history includes Dementia in her father; Hypertension in her father and mother; Stroke in her mother. Allergies  Allergen Reactions  . Penicillins     REACTION: hives  . Sulfa Antibiotics Rash     Review of Systems  Constitutional: Negative for fatigue.  Eyes: Negative for visual disturbance.  Respiratory: Negative for cough, chest tightness, shortness of breath and wheezing.   Cardiovascular: Positive for palpitations. Negative for chest pain and leg swelling.  Gastrointestinal: Negative for abdominal pain.  Endocrine: Negative for polydipsia and polyuria.  Neurological: Negative for dizziness, seizures, syncope, weakness, light-headedness and headaches.       Objective:   Physical Exam  Constitutional: She appears well-developed and well-nourished.  Eyes: Pupils are equal, round, and reactive to light.  Neck:  Neck supple. No JVD present. No thyromegaly present.  Cardiovascular: Normal rate and regular rhythm. Exam reveals no gallop.  Pulmonary/Chest: Effort normal and breath sounds normal. No respiratory distress. She has no wheezes. She has no rales.  Musculoskeletal: She exhibits no edema.  Neurological: [name] alert.       Assessment:     #1 hypertension stable  #2 frequent palpitations. Suspect probably most likely symptomatic PACs or PVCs    Plan:     -Check EKG=occ PVC -Minimize caffeine use and also needs to scale back alcohol consumption which can trigger PVCs and PACs -We did not see any need to scale back exercise at this time but follow-up immediately for any exertional chest pain or dizziness or other new symptoms  Eulas Post MD [facility]

## 2017-04-22 ENCOUNTER — Other Ambulatory Visit: Payer: Self-pay | Admitting: Family Medicine

## 2017-04-29 ENCOUNTER — Telehealth: Payer: Self-pay | Admitting: *Deleted

## 2017-04-29 NOTE — Progress Notes (Signed)
Referring-Bruce Burchette, MD Reason for referral-Palpitations  HPI: 57 year old female for evaluation of palpitations at request of Eduard Clos MD. Over the past 2 months patient has had occasional palpitations described as a brief flutter.  They last seconds.  Occasional mild dizziness.  She also has mild dyspnea on exertion but no orthopnea, PND, pedal edema, syncope.  Because of the above we were asked to evaluate.  Current Outpatient Medications  Medication Sig Dispense Refill  . ALPRAZolam (XANAX) 0.5 MG tablet TAKE 1 TABLET BY MOUTH AT BEDTIME AS NEEDED FOR ANXIETY 30 tablet 0  . ibuprofen (ADVIL,MOTRIN) 200 MG tablet Take 200 mg by mouth every 6 (six) hours as needed for pain.    Marland Kitchen levalbuterol (XOPENEX HFA) 45 MCG/ACT inhaler INHALE 1 TO 2 PUFFS INTO THE LUNGS EVERY 4 HOURS AS NEEDED 15 Inhaler 3  . levocetirizine (XYZAL) 5 MG tablet TAKE 1 TABLET BY MOUTH EVERY DAY 90 tablet 1  . losartan (COZAAR) 50 MG tablet TAKE 1 TABLET DAILY 90 tablet 2  . metoprolol succinate (TOPROL-XL) 25 MG 24 hr tablet TAKE 1 TABLET (25 MG TOTAL) BY MOUTH DAILY. 90 tablet 1  . mupirocin ointment (BACTROBAN) 2 % APPLY ON THE SKIN DAILY  0  . triamcinolone (NASACORT) 55 MCG/ACT nasal inhaler Place 2 sprays into the nose daily.     No current facility-administered medications for this visit.     Allergies  Allergen Reactions  . Penicillins     REACTION: hives  . Sulfa Antibiotics Rash     Past Medical History:  Diagnosis Date  . Allergy   . ASTHMA 04/06/2009  . HYPERTENSION 04/06/2009  . Neck pain, chronic   . UTI (urinary tract infection)     Past Surgical History:  Procedure Laterality Date  . APPENDECTOMY    . TONSILLECTOMY      Social History   Socioeconomic History  . Marital status: Married    Spouse name: Not on file  . Number of children: 3  . Years of education: Not on file  . Highest education level: Not on file  Social Needs  . Financial resource strain: Not on  file  . Food insecurity - worry: Not on file  . Food insecurity - inability: Not on file  . Transportation needs - medical: Not on file  . Transportation needs - non-medical: Not on file  Occupational History    Comment: Realtor  Tobacco Use  . Smoking status: Never Smoker  . Smokeless tobacco: Never Used  Substance and Sexual Activity  . Alcohol use: Yes    Comment: 3 glasses per night  . Drug use: No  . Sexual activity: Not on file  Other Topics Concern  . Not on file  Social History Narrative  . Not on file    Family History  Problem Relation Age of Onset  . Hypertension Mother   . Stroke Mother   . Hypertension Father   . Dementia Father     ROS: no fevers or chills, productive cough, hemoptysis, dysphasia, odynophagia, melena, hematochezia, dysuria, hematuria, rash, seizure activity, orthopnea, PND, pedal edema, claudication. Remaining systems are negative.  Physical Exam:   Blood pressure 121/76, pulse 63, height 5\' 5"  (1.651 m), weight 158 lb (71.7 kg).  General:  Well developed/well nourished in NAD Skin warm/dry Patient not depressed No peripheral clubbing Back-normal HEENT-normal/normal eyelids Neck supple/normal carotid upstroke bilaterally; no bruits; no JVD; no thyromegaly chest - CTA/ normal expansion CV - RRR/normal S1 and S2;  no murmurs, rubs or gallops;  PMI nondisplaced Abdomen -NT/ND, no HSM, no mass, + bowel sounds, no bruit 2+ femoral pulses, no bruits Ext-no edema, chords, 2+ DP Neuro-grossly nonfocal  ECG -April 09, 2017-sinus rhythm with occasional PVC.  Personally reviewed  A/P  1 Palpitations-sound potentially to be PACs or PVCs.  We will check an event monitor.  Schedule echocardiogram to assess LV function.  Check TSH, potassium and magnesium.  Patient is already taking a beta-blocker.  We can advance as tolerated in the future if needed.  Note she consumes 3 glasses of wine daily.  I have asked her to decrease this as this could be  exacerbating her ectopy.  2 hypertension-blood pressure is presently controlled.  Follow at home and adjust regimen as needed.  3 dyspnea-schedule echocardiogram to assess LV function.  Kirk Ruths, MD

## 2017-04-29 NOTE — Telephone Encounter (Signed)
Copied from [place] [id number]. Topic: Quick Communication - See Telephone Encounter >> Apr 29, 2017  3:19 PM Antonieta Iba C wrote: CRM for notification. See Telephone encounter for: pt says that she is still having heart palpitations. Pt says that she was already seen for concern and is looking into scheduling an apt with a cardiologist. Pt says that she doesn't need a referral she would just like to know if Dr. Elease Hashimoto would suggest a cardiologist for her to look into to see.    Please advise.   04/29/17.

## 2017-04-29 NOTE — Telephone Encounter (Signed)
I think it would be fine to see a cardiologist- but as per previous note definitely needs to minimize caffeine intake and alcohol (wine).

## 2017-04-30 NOTE — Telephone Encounter (Signed)
Discussed w/ Dr. Elease Hashimoto, because she has palpitations, he recommends probably Dr Blima Dessert, or Lovena Le. Called patient and left detailed message w/ instructions and recommendations.

## 2017-05-08 ENCOUNTER — Encounter: Payer: Self-pay | Admitting: Cardiology

## 2017-05-08 ENCOUNTER — Ambulatory Visit (INDEPENDENT_AMBULATORY_CARE_PROVIDER_SITE_OTHER): Payer: Managed Care, Other (non HMO) | Admitting: Cardiology

## 2017-05-08 VITALS — BP 121/76 | HR 63 | Ht 65.0 in | Wt 158.0 lb

## 2017-05-08 DIAGNOSIS — I10 Essential (primary) hypertension: Secondary | ICD-10-CM

## 2017-05-08 DIAGNOSIS — R002 Palpitations: Secondary | ICD-10-CM | POA: Diagnosis not present

## 2017-05-08 DIAGNOSIS — R0600 Dyspnea, unspecified: Secondary | ICD-10-CM | POA: Diagnosis not present

## 2017-05-08 NOTE — Patient Instructions (Signed)
Medication Instructions:   NO CHANGE  Labwork:  Your physician recommends that you HAVE LAB WORK TODAY  Testing/Procedures:  Your physician has requested that you have an echocardiogram. Echocardiography is a painless test that uses sound waves to create images of your heart. It provides your doctor with information about the size and shape of your heart and how well your heart's chambers and valves are working. This procedure takes approximately one hour. There are no restrictions for this procedure.   Your physician has recommended that you wear an event monitor. Event monitors are medical devices that record the heart's electrical activity. Doctors most often [place] these monitors to diagnose arrhythmias. Arrhythmias are problems with the speed or rhythm of the heartbeat. The monitor is a small, portable device. You can wear one while you do your normal daily activities. This is usually used to diagnose what is causing palpitations/syncope (passing out).    Follow-Up:  Your physician recommends that you schedule a follow-up appointment in: [place]

## 2017-05-09 ENCOUNTER — Telehealth: Payer: Self-pay | Admitting: *Deleted

## 2017-05-09 LAB — MAGNESIUM: Magnesium: 2.2 mg/dL (ref 1.6–2.3)

## 2017-05-09 LAB — BASIC METABOLIC PANEL
BUN/Creatinine Ratio: 19 (ref 9–23)
BUN: 18 mg/dL (ref 6–24)
CALCIUM: 10.6 mg/dL — AB (ref 8.7–10.2)
CHLORIDE: 102 mmol/L (ref 96–106)
CO2: 23 mmol/L (ref 20–29)
Creatinine, Ser: 0.96 mg/dL (ref 0.57–1.00)
GFR, EST AFRICAN AMERICAN: 78 mL/min/{1.73_m2} (ref >59)
GFR, EST NON AFRICAN AMERICAN: 67 mL/min/{1.73_m2} (ref >59)
Glucose: 95 mg/dL (ref 65–99)
POTASSIUM: 5.1 mmol/L (ref 3.5–5.2)
Sodium: 141 mmol/L (ref 134–144)

## 2017-05-09 LAB — TSH: TSH: 1.64 u[IU]/mL (ref 0.450–4.500)

## 2017-05-09 NOTE — Telephone Encounter (Addendum)
-----   Message from Lelon Perla, MD sent at 05/09/2017  7:24 AM EST ----- No change in meds; recheck Ca 12 weeks [place] with pt, aware of labs and need for repeat. Lab orders mailed to the pt

## 2017-05-16 ENCOUNTER — Encounter: Payer: Self-pay | Admitting: *Deleted

## 2017-05-16 ENCOUNTER — Ambulatory Visit (HOSPITAL_COMMUNITY): Payer: Managed Care, Other (non HMO) | Attending: Cardiovascular Disease

## 2017-05-16 ENCOUNTER — Other Ambulatory Visit: Payer: Self-pay

## 2017-05-16 ENCOUNTER — Ambulatory Visit (INDEPENDENT_AMBULATORY_CARE_PROVIDER_SITE_OTHER): Payer: Managed Care, Other (non HMO)

## 2017-05-16 DIAGNOSIS — R002 Palpitations: Secondary | ICD-10-CM | POA: Insufficient documentation

## 2017-05-16 NOTE — Progress Notes (Signed)
Patient ID: Lori Love, female   DOB: 03/15/1961, 57 y.o.   MRN: [id number] Patient inquiring about cost of cardiac event monitor at appointment.  [employer] called and message with insurance information for benefits quote left on machine.   [employer] representative, Evelena Asa called.  He said to go ahead and put the monitor on.  He would expedite a cost quote.  [employer] to call patient tonite or tomorrow with out of pocket cost.  If patient decides she does not want the monitor based on that quote, she may remove and return the monitor free of charge.

## 2017-05-16 NOTE — Progress Notes (Signed)
Patient ID: Lori Love, female   DOB: 1960-06-22, 57 y.o.   MRN: [id number]  Patient going on vacation 05/27/17-06/02/17 and does not want to wear her cardiac event monitor during that time.  Patient was told just to make sure to contact [employer] and let them know when she will be taking off the monitor and when she expects to put it back on.

## 2017-05-20 ENCOUNTER — Telehealth: Payer: Self-pay | Admitting: Cardiology

## 2017-05-20 NOTE — Telephone Encounter (Signed)
Spoke with pt, questions regarding calcium answered. Questions regarding monitor on vacation also answered.

## 2017-05-20 NOTE — Telephone Encounter (Signed)
New Message     Is calcium level being elevated something she has to worry about?  And how long does she have to wear the holter monitor she is going on vacation next week and she doesn't want to wear it on vacation

## 2017-06-05 ENCOUNTER — Telehealth: Payer: Self-pay | Admitting: Cardiology

## 2017-06-05 NOTE — Telephone Encounter (Signed)
Returned call to patient concerning "serious" event monitor report finding on day 20 of 30 day event monitor. Around 8:18-8:19 last night, SVT (1 minute) was noted with HR in 160s. Called patient who reports she was playing tennis around this time, she was asymptomatic. She does reports that she exercises regularly but notices her heart racing during exertion/exercise. Recent echo was normal. She plans to continue to wear monitor and will be doing a spin class/tennis again tomorrow. [name] scheduled for follow up on May 1.   Will route to primary cardiologist for review when in office/FYI Reviewed with DOD - no changes recommended.

## 2017-06-25 ENCOUNTER — Ambulatory Visit (INDEPENDENT_AMBULATORY_CARE_PROVIDER_SITE_OTHER): Payer: Managed Care, Other (non HMO) | Admitting: Internal Medicine

## 2017-06-25 ENCOUNTER — Encounter: Payer: Self-pay | Admitting: Internal Medicine

## 2017-06-25 VITALS — BP 120/80 | HR 62 | Temp 98.4°F | Wt 155.0 lb

## 2017-06-25 DIAGNOSIS — N390 Urinary tract infection, site not specified: Secondary | ICD-10-CM | POA: Diagnosis not present

## 2017-06-25 LAB — POCT URINALYSIS DIPSTICK
BILIRUBIN UA: NEGATIVE
Blood, UA: NEGATIVE
GLUCOSE UA: NEGATIVE
KETONES UA: NEGATIVE
Nitrite, UA: NEGATIVE
Protein, UA: NEGATIVE

## 2017-06-25 MED ORDER — TRIMETHOPRIM 100 MG PO TABS: 100.0000 mg | ORAL_TABLET | Freq: Two times a day (BID) | ORAL | 0 refills | Status: DC

## 2017-06-25 NOTE — Patient Instructions (Signed)
Drink as much fluid as you  can tolerate over the next few days  Take your antibiotic as prescribed until ALL of it is gone, but stop if you develop a rash, swelling, or any side effects of the medication.  Contact our office as soon as possible if  there are side effects of the medication.

## 2017-06-25 NOTE — Progress Notes (Signed)
Subjective:    Patient ID: Lori Love, female    DOB: 07/05/1960, 57 y.o.   MRN: [id number]  HPI  57 year old patient who presents with a several day history of urinary frequency urgency and some dysuria. She was treated for a UTI approximately year and a half ago.  She does have both a sulfa and penicillin allergy  [name] scheduled to see cardiology soon for follow-up of exercise associated tachycardia  Past Medical History:  Diagnosis Date  . Allergy   . ASTHMA 04/06/2009  . HYPERTENSION 04/06/2009  . Neck pain, chronic   . UTI (urinary tract infection)      Social History   Socioeconomic History  . Marital status: Married    Spouse name: Not on file  . Number of children: 3  . Years of education: Not on file  . Highest education level: Not on file  Occupational History    Comment: Realtor  Social Needs  . Financial resource strain: Not on file  . Food insecurity:    Worry: Not on file    Inability: Not on file  . Transportation needs:    Medical: Not on file    Non-medical: Not on file  Tobacco Use  . Smoking status: Never Smoker  . Smokeless tobacco: Never Used  Substance and Sexual Activity  . Alcohol use: Yes    Comment: 3 glasses per night  . Drug use: No  . Sexual activity: Not on file  Lifestyle  . Physical activity:    Days per week: Not on file    Minutes per session: Not on file  . Stress: Not on file  Relationships  . Social connections:    Talks on phone: Not on file    Gets together: Not on file    Attends religious service: Not on file    Active member of club or organization: Not on file    Attends meetings of clubs or organizations: Not on file    Relationship status: Not on file  . Intimate partner violence:    Fear of current or ex partner: Not on file    Emotionally abused: Not on file    Physically abused: Not on file    Forced sexual activity: Not on file  Other Topics Concern  . Not on file  Social History Narrative  . Not  on file    Past Surgical History:  Procedure Laterality Date  . APPENDECTOMY    . TONSILLECTOMY      Family History  Problem Relation Age of Onset  . Hypertension Mother   . Stroke Mother   . Hypertension Father   . Dementia Father     Allergies  Allergen Reactions  . Penicillins     REACTION: hives  . Sulfa Antibiotics Rash    Current Outpatient Medications on File Prior to Visit  Medication Sig Dispense Refill  . ALPRAZolam (XANAX) 0.5 MG tablet TAKE 1 TABLET BY MOUTH AT BEDTIME AS NEEDED FOR ANXIETY 30 tablet 0  . ibuprofen (ADVIL,MOTRIN) 200 MG tablet Take 200 mg by mouth every 6 (six) hours as needed for pain.    Marland Kitchen levalbuterol (XOPENEX HFA) 45 MCG/ACT inhaler INHALE 1 TO 2 PUFFS INTO THE LUNGS EVERY 4 HOURS AS NEEDED 15 Inhaler 3  . levocetirizine (XYZAL) 5 MG tablet TAKE 1 TABLET BY MOUTH EVERY DAY 90 tablet 1  . losartan (COZAAR) 50 MG tablet TAKE 1 TABLET DAILY 90 tablet 2  . metoprolol succinate (TOPROL-XL) 25  MG 24 hr tablet TAKE 1 TABLET (25 MG TOTAL) BY MOUTH DAILY. 90 tablet 1  . mupirocin ointment (BACTROBAN) 2 % APPLY ON THE SKIN DAILY  0  . triamcinolone (NASACORT) 55 MCG/ACT nasal inhaler Place 2 sprays into the nose daily.     No current facility-administered medications on file prior to visit.     BP 120/80 (BP Location: Right Arm, Patient Position: Sitting, Cuff Size: Large)   Pulse 62   Temp 98.4 F (36.9 C)   Wt 155 lb (70.3 kg)   SpO2 97%   BMI 25.79 kg/m     Review of Systems  Constitutional: Negative.   HENT: Negative for congestion, dental problem, hearing loss, rhinorrhea, sinus pressure, sore throat and tinnitus.   Eyes: Negative for pain, discharge and visual disturbance.  Respiratory: Negative for cough and shortness of breath.   Cardiovascular: Negative for chest pain, palpitations and leg swelling.  Gastrointestinal: Negative for abdominal distention, abdominal pain, blood in stool, constipation, diarrhea, nausea and vomiting.    Genitourinary: Positive for dysuria, frequency and urgency. Negative for difficulty urinating, flank pain, hematuria, pelvic pain, vaginal bleeding, vaginal discharge and vaginal pain.  Musculoskeletal: Negative for arthralgias, gait problem and joint swelling.  Skin: Negative for rash.  Neurological: Negative for dizziness, syncope, speech difficulty, weakness, numbness and headaches.  Hematological: Negative for adenopathy.  Psychiatric/Behavioral: Negative for agitation, behavioral problems and dysphoric mood. The patient is not nervous/anxious.        Objective:   Physical Exam  Constitutional: She appears well-developed and well-nourished. No distress.          Assessment & Plan:   UTI.  Urinalysis revealed 70 WBCs per high-powered field.  Will treat with trimethoprim 100 mg twice daily for 7 days.  Liberal fluid intake encouraged Hypertension stable  Nyoka Cowden

## 2017-07-01 ENCOUNTER — Telehealth: Payer: Self-pay | Admitting: Family Medicine

## 2017-07-01 NOTE — Telephone Encounter (Signed)
Pt seen on 06/25/17 by Dr. Burnice Logan and was treated for a UTI. Pt states she feels 85-95% better but is still feeling pressure with urinating and while lying in bed. Pt voices concern that there is still "something there" and is concerned that it will flare back up while traveling on Friday. Pt was prescribed Trimethoprim and will take her last dose on today.  Pt asking for suggestions on what she should do from Dr. Dorris Fetch. Pt can be contacted at [phone].

## 2017-07-01 NOTE — Telephone Encounter (Signed)
Copied from [place] [phone]. Topic: Quick Communication - See Telephone Encounter >> Jul 01, 2017  9:16 AM Hewitt Shorts wrote: CRM for notification. See Telephone encounter for: 07/01/17. Pt was seem last Tuesdayand was given anyibioyics andf it has improved but not 100 percent gone and would like to know what she needs to do next   [facility]   Best number [phone]

## 2017-07-02 MED ORDER — NITROFURANTOIN MONOHYD MACRO 100 MG PO CAPS: 100.0000 mg | ORAL_CAPSULE | Freq: Two times a day (BID) | ORAL | 0 refills | Status: DC

## 2017-07-02 NOTE — Telephone Encounter (Signed)
I left a message for the pt to return my call. 

## 2017-07-02 NOTE — Addendum Note (Signed)
Addended by: Agnes Lawrence on: 07/02/2017 09:43 AM   Modules accepted: Orders

## 2017-07-02 NOTE — Telephone Encounter (Signed)
Unfortunately, no cx was done.  7 days of antibiotic should have been sufficient to treat- unless there was any resistance.  My suggestion is to send in [place] one po bid for 3 days.  If symptoms persist after that we should have follow up to repeat UA and get culture.

## 2017-07-02 NOTE — Telephone Encounter (Signed)
Patient called back and I informed her of the message below.  [name] aware the Rx for Macrobid was sent to her pharmacy and will call back for an appt if needed.

## 2017-07-03 NOTE — Addendum Note (Signed)
Addended by: Gwenyth Ober R on: 07/03/2017 01:11 PM   Modules accepted: Orders, SmartSet

## 2017-07-11 NOTE — Progress Notes (Signed)
HPI: FU palpitations. Echo 2/19 showed normal LV function. TSH 1.640, K 5.1, Mg 2.2, Ca 10.6. Event monitor 2/19 showed sinus with PVCs. Since last seen, she denies dyspnea, chest pain or syncope.  Her palpitations have improved.  She does notice increased heart rate with exercise.  Current Outpatient Medications  Medication Sig Dispense Refill  . ALPRAZolam (XANAX) 0.5 MG tablet TAKE 1 TABLET BY MOUTH AT BEDTIME AS NEEDED FOR ANXIETY 30 tablet 0  . ibuprofen (ADVIL,MOTRIN) 200 MG tablet Take 200 mg by mouth every 6 (six) hours as needed for pain.    Marland Kitchen levalbuterol (XOPENEX HFA) 45 MCG/ACT inhaler INHALE 1 TO 2 PUFFS INTO THE LUNGS EVERY 4 HOURS AS NEEDED 15 Inhaler 3  . levocetirizine (XYZAL) 5 MG tablet TAKE 1 TABLET BY MOUTH EVERY DAY 90 tablet 1  . losartan (COZAAR) 50 MG tablet TAKE 1 TABLET DAILY 90 tablet 2  . metoprolol succinate (TOPROL-XL) 25 MG 24 hr tablet TAKE 1 TABLET (25 MG TOTAL) BY MOUTH DAILY. 90 tablet 1  . triamcinolone (NASACORT) 55 MCG/ACT nasal inhaler Place 2 sprays into the nose daily.     No current facility-administered medications for this visit.      Past Medical History:  Diagnosis Date  . Allergy   . ASTHMA 04/06/2009  . HYPERTENSION 04/06/2009  . Neck pain, chronic   . UTI (urinary tract infection)     Past Surgical History:  Procedure Laterality Date  . APPENDECTOMY    . TONSILLECTOMY      Social History   Socioeconomic History  . Marital status: Married    Spouse name: Not on file  . Number of children: 3  . Years of education: Not on file  . Highest education level: Not on file  Occupational History    Comment: Realtor  Social Needs  . Financial resource strain: Not on file  . Food insecurity:    Worry: Not on file    Inability: Not on file  . Transportation needs:    Medical: Not on file    Non-medical: Not on file  Tobacco Use  . Smoking status: Never Smoker  . Smokeless tobacco: Never Used  Substance and Sexual Activity   . Alcohol use: Yes    Comment: 3 glasses per night  . Drug use: No  . Sexual activity: Not on file  Lifestyle  . Physical activity:    Days per week: Not on file    Minutes per session: Not on file  . Stress: Not on file  Relationships  . Social connections:    Talks on phone: Not on file    Gets together: Not on file    Attends religious service: Not on file    Active member of club or organization: Not on file    Attends meetings of clubs or organizations: Not on file    Relationship status: Not on file  . Intimate partner violence:    Fear of current or ex partner: Not on file    Emotionally abused: Not on file    Physically abused: Not on file    Forced sexual activity: Not on file  Other Topics Concern  . Not on file  Social History Narrative  . Not on file    Family History  Problem Relation Age of Onset  . Hypertension Mother   . Stroke Mother   . Hypertension Father   . Dementia Father     ROS: no fevers or  chills, productive cough, hemoptysis, dysphasia, odynophagia, melena, hematochezia, dysuria, hematuria, rash, seizure activity, orthopnea, PND, pedal edema, claudication. Remaining systems are negative.  Physical Exam: Well-developed well-nourished in no acute distress.  Skin is warm and dry.  HEENT is normal.  Neck is supple.  Chest is clear to auscultation with normal expansion.  Cardiovascular exam is regular rate and rhythm.  Abdominal exam nontender or distended. No masses palpated. Extremities show no edema. neuro grossly intact  A/P  1 palpitations-echocardiogram showed normal LV function.  TSH, potassium and magnesium normal.  Event monitor showed PVCs.  We will continue with beta-blockade.  She notes increased heart rate with activities.  If symptoms worsen we will arrange an exercise treadmill to evaluate heart rate with exercise.  2 hypertension-blood pressure is controlled.  Continue present medications.  3 elevated calcium-we will  repeat laboratory today.  Kirk Ruths, MD

## 2017-07-17 ENCOUNTER — Encounter: Payer: Self-pay | Admitting: Cardiology

## 2017-07-17 ENCOUNTER — Ambulatory Visit (INDEPENDENT_AMBULATORY_CARE_PROVIDER_SITE_OTHER): Payer: Managed Care, Other (non HMO) | Admitting: Cardiology

## 2017-07-17 VITALS — BP 108/70 | HR 67 | Ht 65.0 in | Wt 155.1 lb

## 2017-07-17 DIAGNOSIS — R002 Palpitations: Secondary | ICD-10-CM | POA: Diagnosis not present

## 2017-07-17 DIAGNOSIS — I10 Essential (primary) hypertension: Secondary | ICD-10-CM | POA: Diagnosis not present

## 2017-07-17 NOTE — Patient Instructions (Signed)

## 2017-07-18 LAB — VITAMIN D 25 HYDROXY (VIT D DEFICIENCY, FRACTURES): Vit D, 25-Hydroxy: 28.7 ng/mL — ABNORMAL LOW (ref 30.0–100.0)

## 2017-07-18 LAB — CALCIUM: Calcium: 9.9 mg/dL (ref 8.7–10.2)

## 2017-07-19 ENCOUNTER — Other Ambulatory Visit: Payer: Self-pay | Admitting: Family Medicine

## 2017-08-30 ENCOUNTER — Other Ambulatory Visit: Payer: Self-pay | Admitting: Family Medicine

## 2017-09-02 NOTE — Telephone Encounter (Signed)
Last refill 01/21/17 and last office visit 04/09/17.  Okay to fill?

## 2017-09-02 NOTE — Telephone Encounter (Signed)
Refill once OK. 

## 2017-10-24 ENCOUNTER — Other Ambulatory Visit: Payer: Self-pay | Admitting: Family Medicine

## 2017-11-01 ENCOUNTER — Other Ambulatory Visit: Payer: Self-pay | Admitting: Family Medicine

## 2018-01-23 ENCOUNTER — Other Ambulatory Visit: Payer: Self-pay | Admitting: Family Medicine

## 2018-01-30 ENCOUNTER — Ambulatory Visit (INDEPENDENT_AMBULATORY_CARE_PROVIDER_SITE_OTHER): Payer: Managed Care, Other (non HMO)

## 2018-01-30 ENCOUNTER — Telehealth: Payer: Self-pay | Admitting: Podiatry

## 2018-01-30 ENCOUNTER — Ambulatory Visit (INDEPENDENT_AMBULATORY_CARE_PROVIDER_SITE_OTHER): Payer: Managed Care, Other (non HMO) | Admitting: Podiatry

## 2018-01-30 ENCOUNTER — Encounter: Payer: Self-pay | Admitting: Podiatry

## 2018-01-30 DIAGNOSIS — S92501A Displaced unspecified fracture of right lesser toe(s), initial encounter for closed fracture: Secondary | ICD-10-CM | POA: Diagnosis not present

## 2018-01-30 DIAGNOSIS — S99921A Unspecified injury of right foot, initial encounter: Secondary | ICD-10-CM | POA: Diagnosis not present

## 2018-01-30 DIAGNOSIS — T148XXA Other injury of unspecified body region, initial encounter: Secondary | ICD-10-CM

## 2018-01-30 NOTE — Telephone Encounter (Signed)
I would hold off on exercise until the pain resolves and the skin heals.

## 2018-01-30 NOTE — Progress Notes (Signed)
Subjective:    Patient ID: Lori Love, female    DOB: [birth year], 57 y.o.   MRN: [id number]  HPI  57 year old female presents the office today for concerns of possible fracture of the right third toe.  She states that she fell down steps at her house and she scraped her skin and she had some swelling to the right third toe.  This happened on 4 days ago.  She states that it has gotten better she is been taking pictures every day.  The pain is improved as well as the swelling.  She has no other concerns.  No recent treatment.  Review of Systems  All other systems reviewed and are negative.  Past Medical History:  Diagnosis Date  . Allergy   . ASTHMA 04/06/2009  . HYPERTENSION 04/06/2009  . Neck pain, chronic   . UTI (urinary tract infection)     Past Surgical History:  Procedure Laterality Date  . APPENDECTOMY    . TONSILLECTOMY       Current Outpatient Medications:  .  ALPRAZolam (XANAX) 0.5 MG tablet, TAKE 1 TABLET BY MOUTH AT BEDTIME, Disp: 30 tablet, Rfl: 0 .  ibuprofen (ADVIL,MOTRIN) 200 MG tablet, Take 200 mg by mouth every 6 (six) hours as needed for pain., Disp: , Rfl:  .  levalbuterol (XOPENEX HFA) 45 MCG/ACT inhaler, INHALE 1 TO 2 PUFFS INTO THE LUNGS EVERY 4 HOURS AS NEEDED, Disp: 15 Inhaler, Rfl: 3 .  levocetirizine (XYZAL) 5 MG tablet, TAKE 1 TABLET BY MOUTH EVERY DAY, Disp: 90 tablet, Rfl: 0 .  losartan (COZAAR) 50 MG tablet, TAKE 1 TABLET DAILY, Disp: 30 tablet, Rfl: 8 .  metoprolol succinate (TOPROL-XL) 25 MG 24 hr tablet, TAKE 1 TABLET BY MOUTH EVERY DAY, Disp: 30 tablet, Rfl: 5 .  triamcinolone (NASACORT) 55 MCG/ACT nasal inhaler, Place 2 sprays into the nose daily., Disp: , Rfl:   Allergies  Allergen Reactions  . Penicillins     REACTION: hives  . Sulfa Antibiotics Rash         Objective:   Physical Exam  General: AAO x3, NAD  Dermatological: Superficial abrasions to the distal right hallux and third toe.  The toenail on the left third nail  extremity is ingrown on the medial aspect there is edema erythema to the toe but this is more from where she had skin abrasions.  There is no drainage or pus identified.  There is edema to the third toe.  Vascular: Dorsalis Pedis artery and Posterior Tibial artery pedal pulses are 2/4 bilateral with immedate capillary fill time. There is no pain with calf compression, swelling, warmth, erythema.   Neruologic: Grossly intact via light touch bilateral.  Protective threshold with Semmes Wienstein monofilament intact to all pedal sites bilateral.   Musculoskeletal: Decreased range of motion the first MPJ with mild discomfort with MPJ.  Tenderness to the distal third toe.  Mild tenderness of the fifth toe.  Muscular strength 5/5 in all groups tested bilateral.  Gait: Unassisted, Nonantalgic.     Assessment & Plan:  58 year old female right third toe fracture, contusions -Treatment options discussed including all alternatives, risks, and complications -Etiology of symptoms were discussed -X-rays were obtained and reviewed with the patient.  Small fractures present distal veins of the third toe as well as fifth toe.  Arthritic changes present the first MPJ. -Regards to the areas of skin breakdown due to antibiotic ointment dressing changes daily.  I want her to wear stiffer soled shoe.  I  am concerned about the toenail of the right third toe.  It may come off however I am concerned about acute ingrown cause infection we will monitor this closely.  Discussed Epson salt soaks.  Monitoring signs or symptoms of infection only now go to the ER should any occur.  Trula Slade DPM

## 2018-01-30 NOTE — Telephone Encounter (Signed)
Patient was just seen in office today and forgot to ask if she is able to exercise. Wants to know if can she use a stationary bike or will that impact anything.  Please give patient a call back.

## 2018-01-30 NOTE — Patient Instructions (Signed)

## 2018-01-31 NOTE — Telephone Encounter (Signed)
I informed pt of Dr. Leigh Aurora recommendation on holding off on exercise.

## 2018-02-10 ENCOUNTER — Encounter: Payer: Self-pay | Admitting: Podiatry

## 2018-02-10 ENCOUNTER — Ambulatory Visit (INDEPENDENT_AMBULATORY_CARE_PROVIDER_SITE_OTHER): Payer: Managed Care, Other (non HMO)

## 2018-02-10 ENCOUNTER — Ambulatory Visit (INDEPENDENT_AMBULATORY_CARE_PROVIDER_SITE_OTHER): Payer: Managed Care, Other (non HMO) | Admitting: Podiatry

## 2018-02-10 DIAGNOSIS — S90811D Abrasion, right foot, subsequent encounter: Secondary | ICD-10-CM | POA: Diagnosis not present

## 2018-02-10 DIAGNOSIS — S92501A Displaced unspecified fracture of right lesser toe(s), initial encounter for closed fracture: Secondary | ICD-10-CM | POA: Diagnosis not present

## 2018-02-10 DIAGNOSIS — S92501D Displaced unspecified fracture of right lesser toe(s), subsequent encounter for fracture with routine healing: Secondary | ICD-10-CM

## 2018-02-10 DIAGNOSIS — S99921A Unspecified injury of right foot, initial encounter: Secondary | ICD-10-CM

## 2018-02-17 NOTE — Progress Notes (Signed)
Subjective: 57 year old female presents the office today for follow-up evaluation of superficial abrasions, possible ingrown toenail to the right third toe and third toe fractures.  She says overall she is doing much better.  She said the toenail has not come off and she feels it has reattached itself.  She denies a drainage or pus coming from the toenail of the right third toe.  Otherwise the abrasions have almost completely healed. Denies any systemic complaints such as fevers, chills, nausea, vomiting. No acute changes since last appointment, and no other complaints at this time.   Objective: AAO x3, NAD DP/PT pulses palpable bilaterally, CRT less than 3 seconds Superficial abrasions of healed.  The right third digit toenails firmly adhered to the underlying nail bed.  There is still some faint erythema on the proximal nail border but this is more on the DIPJ I think this is more from the injury as opposed to a toenail issue.  There is no pain of the toenail itself there is no drainage or pus or any other signs of infection. No open lesions or pre-ulcerative lesions.  No pain with calf compression, swelling, warmth, erythema  Assessment: Follow-up from right foot injury, abrasions  Plan: -All treatment options discussed with the patient including all alternatives, risks, complications.  -X-rays were obtained reviewed.  No evidence of acute fracture.  The toenail appears to be firmly adhered.  There is no drainage or pus or any signs of infection.  Would continue with Epson salt soaks current Intermedic ointment and bandages in case.  In the erythema she is having is more from the injury as opposed to infection from the toenail.  Continue to monitor closely.  If there are any issues to let me know otherwise I will see her back as needed. -Patient encouraged to call the office with any questions, concerns, change in symptoms.   Trula Slade DPM

## 2018-03-05 ENCOUNTER — Encounter: Payer: Self-pay | Admitting: Cardiology

## 2018-03-05 ENCOUNTER — Ambulatory Visit (INDEPENDENT_AMBULATORY_CARE_PROVIDER_SITE_OTHER): Payer: Managed Care, Other (non HMO) | Admitting: Cardiology

## 2018-03-05 VITALS — BP 126/84 | HR 58 | Ht 65.0 in | Wt 158.0 lb

## 2018-03-05 DIAGNOSIS — R002 Palpitations: Secondary | ICD-10-CM

## 2018-03-05 DIAGNOSIS — I10 Essential (primary) hypertension: Secondary | ICD-10-CM | POA: Diagnosis not present

## 2018-03-05 NOTE — Progress Notes (Signed)
HPI: FU palpitations. Echo 2/19 showed normal LV function. TSH 1.640, K 5.1, Mg 2.2, Ca 10.6. Event monitor 2/19 showed sinus with PVCs. Since last seen,  her palpitations have improved but she continues to complain of pounding with exercise.  She denies dyspnea, chest pain or syncope.  Current Outpatient Medications  Medication Sig Dispense Refill  . ALPRAZolam (XANAX) 0.5 MG tablet Take 0.5 mg by mouth as needed for anxiety.    Marland Kitchen ibuprofen (ADVIL,MOTRIN) 200 MG tablet Take 200 mg by mouth every 6 (six) hours as needed for pain.    Marland Kitchen levalbuterol (XOPENEX HFA) 45 MCG/ACT inhaler INHALE 1 TO 2 PUFFS INTO THE LUNGS EVERY 4 HOURS AS NEEDED 15 Inhaler 3  . levocetirizine (XYZAL) 5 MG tablet TAKE 1 TABLET BY MOUTH EVERY DAY 90 tablet 0  . losartan (COZAAR) 50 MG tablet TAKE 1 TABLET DAILY 30 tablet 8  . metoprolol succinate (TOPROL-XL) 25 MG 24 hr tablet TAKE 1 TABLET BY MOUTH EVERY DAY 30 tablet 5  . triamcinolone (NASACORT) 55 MCG/ACT nasal inhaler Place 2 sprays into the nose daily.     No current facility-administered medications for this visit.      Past Medical History:  Diagnosis Date  . Allergy   . ASTHMA 04/06/2009  . HYPERTENSION 04/06/2009  . Neck pain, chronic   . UTI (urinary tract infection)     Past Surgical History:  Procedure Laterality Date  . APPENDECTOMY    . TONSILLECTOMY      Social History   Socioeconomic History  . Marital status: Married    Spouse name: Not on file  . Number of children: 3  . Years of education: Not on file  . Highest education level: Not on file  Occupational History    Comment: Realtor  Social Needs  . Financial resource strain: Not on file  . Food insecurity:    Worry: Not on file    Inability: Not on file  . Transportation needs:    Medical: Not on file    Non-medical: Not on file  Tobacco Use  . Smoking status: Never Smoker  . Smokeless tobacco: Never Used  Substance and Sexual Activity  . Alcohol use: Yes   Comment: 3 glasses per night  . Drug use: No  . Sexual activity: Not on file  Lifestyle  . Physical activity:    Days per week: Not on file    Minutes per session: Not on file  . Stress: Not on file  Relationships  . Social connections:    Talks on phone: Not on file    Gets together: Not on file    Attends religious service: Not on file    Active member of club or organization: Not on file    Attends meetings of clubs or organizations: Not on file    Relationship status: Not on file  . Intimate partner violence:    Fear of current or ex partner: Not on file    Emotionally abused: Not on file    Physically abused: Not on file    Forced sexual activity: Not on file  Other Topics Concern  . Not on file  Social History Narrative  . Not on file    Family History  Problem Relation Age of Onset  . Hypertension Mother   . Stroke Mother   . Hypertension Father   . Dementia Father     ROS: no fevers or chills, productive cough, hemoptysis, dysphasia, odynophagia,  melena, hematochezia, dysuria, hematuria, rash, seizure activity, orthopnea, PND, pedal edema, claudication. Remaining systems are negative.  Physical Exam: Well-developed well-nourished in no acute distress.  Skin is warm and dry.  HEENT is normal.  Neck is supple.  Chest is clear to auscultation with normal expansion.  Cardiovascular exam is regular rate and rhythm.  Abdominal exam nontender or distended. No masses palpated. Extremities show no edema. neuro grossly intact  ECG-sinus rhythm at a rate of 58.  RV conduction delay.  No ST changes.  Personally reviewed  A/P  1 palpitations-symptoms are reasonably well controlled.  Previous monitor showed PVCs.  Echocardiogram showed normal LV function.  Plan to continue beta-blocker at present dose.  She does describe a pounding sensation when she exercises.  We will arrange an exercise treadmill to evaluate heart rate with activities.  2 hypertension-patient's  blood pressure is controlled.  However she states her blood pressure is elevated at times at home.  I have asked her to follow this and we will advance her regimen as needed.  Kirk Ruths, MD

## 2018-03-05 NOTE — Patient Instructions (Signed)
Medication Instructions:  NO CHANGE If you need a refill on your cardiac medications before your next appointment, please call your pharmacy.   Lab work: If you have labs (blood work) drawn today and your tests are completely normal, you will receive your results only by: Marland Kitchen MyChart Message (if you have MyChart) OR . A paper copy in the mail If you have any lab test that is abnormal or we need to change your treatment, we will call you to review the results.  Testing/Procedures: Your physician has requested that you have an exercise tolerance test. For further information please visit [email or web address]. Please also follow instruction sheet, as given.TAKE LOPRESSOR THE EVENING AND MORNING PRIOR TO THE TEST   Follow-Up: Your physician recommends that you schedule a follow-up appointment in: [place] IN September FOR APPOINTMENT IN [place]

## 2018-03-06 ENCOUNTER — Ambulatory Visit (HOSPITAL_COMMUNITY)
Admission: RE | Admit: 2018-03-06 | Discharge: 2018-03-06 | Disposition: A | Discharge status: Home / Self Care | Payer: Managed Care, Other (non HMO) | Source: Ambulatory Visit | Attending: Cardiology | Admitting: Cardiology

## 2018-03-06 DIAGNOSIS — R002 Palpitations: Secondary | ICD-10-CM | POA: Diagnosis present

## 2018-03-06 LAB — EXERCISE TOLERANCE TEST
CHL CUP MPHR: 165 {beats}/min
CSEPPHR: 160 {beats}/min
Estimated workload: 10.1 METS
Exercise duration (min): 9 min
Exercise duration (sec): 3 s
Percent HR: 96 %
RPE: 17
Rest HR: 60 {beats}/min

## 2018-03-07 ENCOUNTER — Telehealth: Payer: Self-pay | Admitting: Cardiology

## 2018-03-07 DIAGNOSIS — I10 Essential (primary) hypertension: Secondary | ICD-10-CM

## 2018-03-07 MED ORDER — LOSARTAN POTASSIUM 100 MG PO TABS: 100.0000 mg | ORAL_TABLET | Freq: Every day | ORAL | 3 refills | Status: DC

## 2018-03-07 NOTE — Telephone Encounter (Signed)
Left detailed message for patient of new medication instructions. New script sent to the pharmacy and Lab orders mailed to the pt

## 2018-03-07 NOTE — Telephone Encounter (Signed)
° ° °  Patient calling with questions regarding stress test result. Please call

## 2018-03-07 NOTE — Telephone Encounter (Signed)
Spoke with pt, patient is concerned about the bp elevation at peak exercise and the fluctuations in the bp during the day. Okay to continue to exercise at the current level and if she needs to make changes in medications. Will forward for dr Stanford Breed review

## 2018-03-07 NOTE — Telephone Encounter (Signed)
Change Cozaar to 100 mg daily.  Follow blood pressure and call with issues.  Check BMP in 1 week. Kirk Ruths, MD

## 2018-04-16 ENCOUNTER — Encounter: Payer: Self-pay | Admitting: Obstetrics & Gynecology

## 2018-04-23 ENCOUNTER — Other Ambulatory Visit: Payer: Self-pay | Admitting: *Deleted

## 2018-04-23 ENCOUNTER — Telehealth: Payer: Self-pay | Admitting: Cardiology

## 2018-04-23 ENCOUNTER — Other Ambulatory Visit: Payer: Self-pay | Admitting: Family Medicine

## 2018-04-23 DIAGNOSIS — R7989 Other specified abnormal findings of blood chemistry: Secondary | ICD-10-CM

## 2018-04-23 NOTE — Telephone Encounter (Signed)
New Message   Patient is returning call that was initial made to her on 03/07/2018 in reference to a change in medication. Please call to discuss.

## 2018-04-23 NOTE — Telephone Encounter (Signed)
Refill once #30 with no refills.

## 2018-04-23 NOTE — Telephone Encounter (Signed)
Last OV 06/25/17 for UTI, Next OV 06/02/18  Last filled 09/02/17, does not show quantity or refills

## 2018-04-23 NOTE — Telephone Encounter (Addendum)
Patient states she will be in on 3/16 for physical, she has been seeing her cardiologist and has had blood work there due to her blood pressure. She said Dr Elease Hashimoto advised her to do this. She would like to know could he refill her xanax until her appt because it is flu season and rather not come in right now. She said to look in the system and you will be able to see her blood work. The last refill she had on this was 09/02/2017 and it has lasted her until now. She takes it as needed. Please advise.   [id number] [place], [place] - [address]  [address] [place] [address]

## 2018-04-23 NOTE — Telephone Encounter (Signed)
Called patient, she states that she forgot to come in for her lab work from 03/07/18 call from [place], [place] she is coming tomorrow, and would like Dr.Crenshaw to draw a Vitamin D lab, as he drew it last time. Patient also states she is not taking the entire 100 mg of Cozaar, she cutting it into smaller pieces and only taking roughly 75 mg. She states her BP has been good until today and it was 165/100 today. I advised patient to recheck with a different BP machine and to call back if BP is continuously elevated and to keep a diary. Patient has no symptoms, and feels great.  Please advise if okay to add lab? Thanks!

## 2018-04-23 NOTE — Telephone Encounter (Signed)
Ok  Lori Love  

## 2018-04-23 NOTE — Telephone Encounter (Signed)
Spoke with pt, aware vit d lab ordered.

## 2018-04-24 ENCOUNTER — Telehealth: Payer: Self-pay | Admitting: Cardiology

## 2018-04-24 DIAGNOSIS — I10 Essential (primary) hypertension: Secondary | ICD-10-CM

## 2018-04-24 DIAGNOSIS — Z79899 Other long term (current) drug therapy: Secondary | ICD-10-CM

## 2018-04-24 MED ORDER — ALPRAZOLAM 0.5 MG PO TABS: 0.5000 mg | ORAL_TABLET | ORAL | 0 refills | Status: DC | PRN

## 2018-04-24 NOTE — Telephone Encounter (Signed)
Rx done. 

## 2018-04-24 NOTE — Telephone Encounter (Signed)
Continue losartan at present dose; follow BP; will add HCTZ 12.5 mg daily if BP continues to run high. Kirk Ruths

## 2018-04-24 NOTE — Telephone Encounter (Signed)
New message     Pt c/o BP issue: STAT if pt c/o blurred vision, one-sided weakness or slurred speech  1. What are your last 5 BP readings? 177/109 hr 59   2. Are you having any other symptoms (ex. Dizziness, headache, blurred vision, passed out)? Little headache   3. What is your BP issue? Pt stated that her hr resting is 59 . She came in today for labs and bp was high . She was advised to  go home and take medication and if b/p is still high to call .

## 2018-04-24 NOTE — Telephone Encounter (Signed)
Per pt has noted B/P trending upward over the last few days, since Tuesday has resumed the Losartan 100 mg and b/p has been running  177-198 / 101-109  today yesterday  no other symptoms has been exercising 5-6 times a week started circuit training mid January.Will forward to Dr Stanford Breed for review and recommendations ./cy

## 2018-04-24 NOTE — Addendum Note (Signed)
Addended by: Agnes Lawrence on: 04/24/2018 01:30 PM   Modules accepted: Orders

## 2018-04-25 ENCOUNTER — Other Ambulatory Visit: Payer: Self-pay

## 2018-04-25 ENCOUNTER — Other Ambulatory Visit: Payer: Self-pay | Admitting: Family Medicine

## 2018-04-25 ENCOUNTER — Telehealth: Payer: Self-pay | Admitting: Cardiology

## 2018-04-25 DIAGNOSIS — I10 Essential (primary) hypertension: Secondary | ICD-10-CM

## 2018-04-25 LAB — BASIC METABOLIC PANEL
BUN/Creatinine Ratio: 17 (ref 9–23)
BUN: 15 mg/dL (ref 6–24)
CO2: 25 mmol/L (ref 20–29)
Calcium: 9.8 mg/dL (ref 8.7–10.2)
Chloride: 101 mmol/L (ref 96–106)
Creatinine, Ser: 0.9 mg/dL (ref 0.57–1.00)
GFR calc Af Amer: 83 mL/min/{1.73_m2} (ref >59)
GFR calc non Af Amer: 72 mL/min/{1.73_m2} (ref >59)
Glucose: 87 mg/dL (ref 65–99)
Potassium: 4.6 mmol/L (ref 3.5–5.2)
SODIUM: 137 mmol/L (ref 134–144)

## 2018-04-25 LAB — VITAMIN D 25 HYDROXY (VIT D DEFICIENCY, FRACTURES): Vit D, 25-Hydroxy: 30.5 ng/mL (ref 30.0–100.0)

## 2018-04-25 MED ORDER — LOSARTAN POTASSIUM 100 MG PO TABS: 100.0000 mg | ORAL_TABLET | Freq: Every day | ORAL | 0 refills | Status: DC

## 2018-04-25 MED ORDER — HYDROCHLOROTHIAZIDE 12.5 MG PO CAPS: 12.5000 mg | ORAL_CAPSULE | Freq: Every day | ORAL | 3 refills | Status: DC

## 2018-04-25 NOTE — Telephone Encounter (Signed)
° ° °  Patient returning call. BP this morning 195/120 HR 57

## 2018-04-25 NOTE — Telephone Encounter (Signed)
Pt aware of med change and will need BMET in a week [facility] ./cy

## 2018-04-25 NOTE — Telephone Encounter (Signed)
New Message   Pt c/o BP issue: STAT if pt c/o blurred vision, one-sided weakness or slurred speech  1. What are your last 5 BP readings? 195/123, 193/113, 185/108, 157/104, and 184/113 Pulse Rate between 55 and 60 all day  2. Are you having any other symptoms (ex. Dizziness, headache, blurred vision, passed out)? A little of dizziness  3. What is your BP issue? Patient's blood pressure has be elevated lately, and states she had a headache yesterday mainly while standing.

## 2018-04-25 NOTE — Telephone Encounter (Signed)
Spoke with pt, she took the first dose of HCTZ today and is anxious because her bp is not back to normal. Explained that the medication has not had time to take effect. Patient instructed to not take her bp anymore today. Then she will take her bp once daily only. She will continue activities as usual. She will do light exercise until bp is down. She will call back Monday if bp has not changed over the weekend. Pt agreed with this plan.

## 2018-04-28 ENCOUNTER — Encounter: Payer: Self-pay | Admitting: Family Medicine

## 2018-04-28 ENCOUNTER — Other Ambulatory Visit: Payer: Self-pay

## 2018-04-28 ENCOUNTER — Telehealth: Payer: Self-pay | Admitting: *Deleted

## 2018-04-28 ENCOUNTER — Ambulatory Visit (INDEPENDENT_AMBULATORY_CARE_PROVIDER_SITE_OTHER): Payer: Managed Care, Other (non HMO) | Admitting: Family Medicine

## 2018-04-28 DIAGNOSIS — I10 Essential (primary) hypertension: Secondary | ICD-10-CM

## 2018-04-28 MED ORDER — VALSARTAN 80 MG PO TABS: 80.0000 mg | ORAL_TABLET | Freq: Every day | ORAL | 5 refills | Status: DC

## 2018-04-28 MED ORDER — LOSARTAN POTASSIUM 100 MG PO TABS: 100.0000 mg | ORAL_TABLET | Freq: Every day | ORAL | 3 refills | Status: DC

## 2018-04-28 NOTE — Telephone Encounter (Signed)
Spoke with patient this morning, she stated that her blood pressure did not come down at all with the HCTZ and the Losartan 100 mg daily. She discussed with her pharmacist and was told could be a "bad batch" of Losartan. She did go back on her Losartan 50 mg and blood pressure went down some although not to goal. Patient stated she did not like HCTZ, it made her feel dizzy and had a feeling in her bladder area that was hard to explain. She does not want to take HCTZ. [facility] and concerned about the Losartan now. Discussed with Raquel and will d/c Losartan and start Valsartan 80 mg daily, leave off HCTZ. Advised patient, verbalized understanding.        Love, Lori H     10:56 AM  Note      Patient calling to report "uncomfortable feeling in bladder area" Patient states her output of urine has not changed since she started diuretic.  Report BP today is 162/90  Requesting call from nurse

## 2018-04-28 NOTE — Progress Notes (Signed)
Subjective:     Patient ID: Lori Love, female   DOB: 1961-02-14, 58 y.o.   MRN: [id number]  HPI Patient is seen today to discuss hypertension.  She has recently been seen cardiology and is currently on regimen of losartan 100 mg daily, HCTZ (which was just recently added) 12.5 mg daily and metoprolol extended release 25 mg daily.  [name] very active and exercises most days of the week.  She had exercise tolerance test back in December which showed hypertensive blood pressure response but no evidence for ischemia.  Her losartan was increased at that point from 50 mg to 100 mg.  She states that this past Wednesday she had some severe elevations of blood pressure with readings of 166/121 and 155/105 at home.  On Thursday she had readings of 198/108 and 185/108.  By Friday she had reading of 195/123 and at that point HCTZ was called in per cardiology.  Saturday her blood pressure is down to 143/76.  Patient had recent new prescription for losartan and she was concerned that the current batch of losartan she had was somehow not working well.  She has had some recent occasional headaches.  No chest pains.  She has had some mild lightheadedness since starting HCTZ.  Still drinks about 12 ounces of wine per day.  Past Medical History:  Diagnosis Date  . Allergy   . ASTHMA 04/06/2009  . HYPERTENSION 04/06/2009  . Neck pain, chronic   . UTI (urinary tract infection)    Past Surgical History:  Procedure Laterality Date  . APPENDECTOMY    . TONSILLECTOMY      reports that she has never smoked. She has never used smokeless tobacco. She reports current alcohol use. She reports that she does not use drugs. family history includes Dementia in her father; Hypertension in her father and mother; Stroke in her mother. Allergies  Allergen Reactions  . Penicillins     REACTION: hives  . Sulfa Antibiotics Rash     Review of Systems  Constitutional: Negative for fatigue.  Eyes: Negative for visual  disturbance.  Respiratory: Negative for cough, chest tightness, shortness of breath and wheezing.   Cardiovascular: Negative for chest pain, palpitations and leg swelling.  Neurological: Positive for light-headedness. Negative for seizures, syncope, weakness and headaches.       Objective:   Physical Exam Constitutional:      Appearance: [name] well-developed.  Eyes:     Pupils: Pupils are equal, round, and reactive to light.  Neck:     Musculoskeletal: Neck supple.     Thyroid: No thyromegaly.     Vascular: No JVD.  Cardiovascular:     Rate and Rhythm: Normal rate and regular rhythm.     Heart sounds: No gallop.   Pulmonary:     Effort: Pulmonary effort is normal. No respiratory distress.     Breath sounds: Normal breath sounds. No wheezing or rales.  Neurological:     Mental Status: [name] alert.        Assessment:     Poorly controlled hypertension.  Initial reading today 146/92 and repeat left and right arm seated 160/90    Plan:     -She will continue losartan 100 mg daily.  She plans to get her new prescription at another pharmacy.  Pt has concerns whether her current Losartan is ineffective- and we explained that is not likely.  -[name] encouraged to continue with HCTZ 12.5 mg daily.  We explained that is  common sometimes to have some mild lightheadedness when starting a new regimen  -Continue low-dose Toprol-XL  -We advised her to try to reduce her wine intake to less than 12 ounces per day  -Monitor blood pressure closely over the next week and if consistently greater than 140/90 be in touch and we can make some adjustments  Eulas Post MD [facility]

## 2018-04-28 NOTE — Telephone Encounter (Signed)
° °  Patient calling to report "uncomfortable feeling in bladder area" Patient states her output of urine has not changed since she started diuretic.  Report BP today is 162/90  Requesting call from nurse

## 2018-04-28 NOTE — Telephone Encounter (Signed)
See phone note 2/10

## 2018-04-28 NOTE — Telephone Encounter (Signed)
   Cristopher Estimable, RN at 04/25/2018 4:42 PM   Status: Signed    Spoke with pt, she took the first dose of HCTZ today and is anxious because her bp is not back to normal. Explained that the medication has not had time to take effect. Patient instructed to not take her bp anymore today. Then she will take her bp once daily only. She will continue activities as usual. She will do light exercise until bp is down. She will call back Monday if bp has not changed over the weekend. Pt agreed with this plan.

## 2018-04-28 NOTE — Patient Instructions (Signed)
Monitor blood pressure and be in touch if not consistently < 140/90 over next several days.

## 2018-04-30 ENCOUNTER — Telehealth: Payer: Self-pay | Admitting: Cardiology

## 2018-04-30 ENCOUNTER — Telehealth: Payer: Self-pay | Admitting: *Deleted

## 2018-04-30 MED ORDER — HYDROCHLOROTHIAZIDE 12.5 MG PO CAPS: 12.5000 mg | ORAL_CAPSULE | Freq: Every day | ORAL | Status: DC

## 2018-04-30 MED ORDER — LOSARTAN POTASSIUM 100 MG PO TABS: 100.0000 mg | ORAL_TABLET | Freq: Every day | ORAL | Status: DC

## 2018-04-30 NOTE — Telephone Encounter (Signed)
It looks like cardiology d/ced her Losartan 2 days ago and started Valsartan.  Has she made that change yet??

## 2018-04-30 NOTE — Telephone Encounter (Signed)
She could take one extra HCTZ but would not do this regularly and only if systolic > 431.

## 2018-04-30 NOTE — Telephone Encounter (Signed)
Called patient and she stated that she took the Losartan that you prescribed and she had filled at a different pharmacy. She also measured her wine and wine was 7 ounces at 6:30pm, BP was elevated at midnight, head was throbbing and she took a Losartan from her husband, 34, the HCTZ is working better and did help reduce her BP for a little while. 177/101 again at midnight last night and took an additional Losartan and took an extra HCTZ, 164/87 this morning, again 137/89 about 30 mins later. 154/92 at lunch today, took an HCTZ and her headache is going away. As of now the HCTZ is working best. She wants to know if she could have a tolerance to Losartan? [name] having a little dizziness with the HCTZ

## 2018-04-30 NOTE — Telephone Encounter (Signed)
Please see message. °

## 2018-04-30 NOTE — Telephone Encounter (Signed)
As I noted on previous response, it appears that cardiologist sent in Valsartan (to replace her Losartan) and I would go ahead as per their recommendation and start that.  Her BP meds would be Valsartan 80 mg once daily, HCTZ 12.5 mg once daily, and Toprol XL 25 mg once daily.  If BP not improving over the next week with that regimen we could look at adding calcium channel blocker.

## 2018-04-30 NOTE — Telephone Encounter (Signed)
New Message   Pt c/o BP issue: STAT if pt c/o blurred vision, one-sided weakness or slurred speech  1. What are your last 5 BP readings? Last night 177/101, 168/95, after pt took medication 150/93, 136/80, when medication wore off, BP went back up 164/87   2. Are you having any other symptoms (ex. Dizziness, headache, blurred vision, passed out)? Headache, Dizzines   3. What is your BP issue? Pt has been having a flucuating BP and is needing help to control it. She states she took EXTRA 50mg  Losartin and HCTZ and it brought it back down but it went back up after medication wore off

## 2018-04-30 NOTE — Telephone Encounter (Signed)
Continue present meds and schedule fu in pharmacist hypertension clinic Kirk Ruths

## 2018-04-30 NOTE — Telephone Encounter (Signed)
Returned pt call. Pt sts that there has been several changes in her BP medication recently and her BP has remained elevated. Update the pt med list to reflect what she is currently taking.  Losartan 100mg  daily HCTZ 12.5mg  daily Metoprolol XL 25mg  daily.  Pt called the office on 2/10 to report elevated BP. Melinda, LPN discussed with Raquel, Pharm-D who recommended that the pt d/c Losartan and start Valsartan 80mg  daily. She was seen by her pcp the same day who recommended she continue taking her medications as listed above. Her BP readings have been all over the place, she is checking her BP several times daily. Even when she sits and relaxes prior to checking her BP her readings still remain elevated. 150/93 168/95 164/87 136/80 Her HR runs in the high 50s-low 60s Over the weekend her diastolic was >505 and she was instructed by her Pharmacist to take and additional 50mg  of Losartan. She does note that when she takes her HCTZ her BP runs lower. Her BP at 1am  Diastolic was >397 so she went ahead and took her HCTZ. She wants to know if she can take it again the morning, and if Dr.Crenshaw feels that Valsartan may be more effective.  She sounds anxious, but denies feeling anxious. She sts that she knows when her BP is high due to headache and that her BP was at "stroke levels" for several days. She wants to know what she needs to do. Adv pt that I will fwd the message to [facility] and our Pharmacist for further recommendation and we will call back with their response.

## 2018-04-30 NOTE — Telephone Encounter (Signed)
Spoke with pt, Follow up scheduled 05-19-2018.

## 2018-04-30 NOTE — Telephone Encounter (Signed)
Called patient and gave her the information from Dr. Elease Hashimoto and she greatly appreciates everything as her cardiology office has not returned her call. Patient is asking what does she need to do when she wakes up at midnight when she has a terrible headache and her BP is at the highest number? She wants to know if she should take HCTZ? Or any other suggestions? [name] worried that it may still spike while she is taking the new medication.  Please advise.

## 2018-04-30 NOTE — Telephone Encounter (Signed)
Will send to Dr Elease Hashimoto as Juluis Rainier.  Copied from [place] [phone]. Topic: General - Other >> Apr 30, 2018 11:02 AM Alanda Slim E wrote: Reason for CRM: Pt was told to keep Dr. Elease Hashimoto in the loop about her BP. Pt stated it spiked again to 188/104 with the medication. She reached out to her cardiologist and is waiting to hear back/ please advise

## 2018-05-01 NOTE — Telephone Encounter (Signed)
I called the pt and informed her of the message below.  She wanted to let Dr Elease Hashimoto know that she noticed the Valsartan helped after 1 hour and she could "feel the difference". States last night she felt better after taking no medicine at all and her blood pressure was 137/92.  States BP was 144/89 this AM without the diuretic.  She also wanted to let Dr Elease Hashimoto know she spoke with the cardiology office, felt they were rude and was told Dr Elease Hashimoto could handle this.  Message forwarded to Dr Elease Hashimoto.

## 2018-05-01 NOTE — Telephone Encounter (Signed)
I called the pt and informed her of the message below

## 2018-05-01 NOTE — Telephone Encounter (Signed)
We will be happy to help manage her BP.  Glad she is doing better.

## 2018-05-03 ENCOUNTER — Other Ambulatory Visit: Payer: Self-pay | Admitting: Family Medicine

## 2018-05-10 ENCOUNTER — Other Ambulatory Visit: Payer: Self-pay | Admitting: Family Medicine

## 2018-05-19 ENCOUNTER — Ambulatory Visit: Payer: Managed Care, Other (non HMO)

## 2018-05-30 ENCOUNTER — Other Ambulatory Visit: Payer: Self-pay | Admitting: Family Medicine

## 2018-05-30 NOTE — Telephone Encounter (Signed)
Requested medication (s) are due for refill today - expired  Requested medication (s) are on the active medication list -yes  Future visit scheduled - no  Last refill: 05/17/16  Notes to clinic: Patient is requesting medication that passes protocol- but has expired.  Requested Prescriptions  Pending Prescriptions Disp Refills   levalbuterol (XOPENEX HFA) 45 MCG/ACT inhaler 15 Inhaler 3     Pulmonology:  Beta Agonists Failed - 05/30/2018  2:06 PM      Failed - One inhaler should last at least one month. If the patient is requesting refills earlier, contact the patient to check for uncontrolled symptoms.      Passed - Valid encounter within last 12 months    Recent Outpatient Visits          1 month ago Essential hypertension   [facility] at [facility], Alinda Sierras, MD   11 months ago Acute UTI (urinary tract infection)   Therapist, music at [facility], Doretha Sou, MD   1 year ago Essential hypertension   Therapist[facility] at [facility], Alinda Sierras, MD   1 year ago Physical exam   Therapist, [facility] at [facility], Alinda Sierras, MD   2 years ago Acute sinusitis, recurrence not specified, unspecified location   [facility] at [facility], Alinda Sierras, MD              Requested Prescriptions  Pending Prescriptions Disp Refills   levalbuterol (XOPENEX HFA) 45 MCG/ACT inhaler 15 Inhaler 3     Pulmonology:  Beta Agonists Failed - 05/30/2018  2:06 PM      Failed - One inhaler should last at least one month. If the patient is requesting refills earlier, contact the patient to check for uncontrolled symptoms.      Passed - Valid encounter within last 12 months    Recent Outpatient Visits          1 month ago Essential hypertension   Therapist, [facility] at [facility], Alinda Sierras, MD   11 months ago Acute UTI (urinary tract infection)   Therapist, music at [facility], Doretha Sou, MD   1 year ago Essential  hypertension   Therapist, music at [facility], Alinda Sierras, MD   1 year ago Physical exam   [place] at [facility], Alinda Sierras, MD   2 years ago Acute sinusitis, recurrence not specified, unspecified location   [facility] at [facility], Alinda Sierras, MD

## 2018-05-30 NOTE — Telephone Encounter (Signed)
Copied from [place] [phone]. Topic: Quick Communication - Rx Refill/Question >> May 30, 2018  1:56 PM [name] wrote: Medication:  levalbuterol Penne Lash HFA) 45 MCG/ACT inhaler  Has the patient contacted their pharmacy? Yes - not on file (Agent: If no, request that the patient contact the pharmacy for the refill.) (Agent: If yes, when and what did the pharmacy advise?)  Preferred Pharmacy (with phone number or street name): [id number] [place], [place] - [address] [phone] (Phone) [phone] (Fax)  Agent: Please be advised that RX refills may take up to 3 business days. We ask that you follow-up with your pharmacy.

## 2018-06-02 ENCOUNTER — Other Ambulatory Visit: Payer: Self-pay | Admitting: *Deleted

## 2018-06-02 ENCOUNTER — Encounter: Payer: Managed Care, Other (non HMO) | Admitting: Family Medicine

## 2018-06-02 MED ORDER — LEVALBUTEROL TARTRATE 45 MCG/ACT IN AERO: 1.0000 | INHALATION_SPRAY | RESPIRATORY_TRACT | 3 refills | Status: DC | PRN

## 2018-06-04 NOTE — Telephone Encounter (Signed)
Rx was sent to the pharmacy on 3/16.

## 2018-08-25 ENCOUNTER — Other Ambulatory Visit: Payer: Self-pay | Admitting: Family Medicine

## 2018-10-22 ENCOUNTER — Other Ambulatory Visit: Payer: Self-pay | Admitting: Cardiology

## 2018-10-30 ENCOUNTER — Other Ambulatory Visit: Payer: Self-pay | Admitting: Family Medicine

## 2018-10-31 ENCOUNTER — Encounter: Payer: Managed Care, Other (non HMO) | Admitting: Obstetrics & Gynecology

## 2018-11-03 ENCOUNTER — Encounter: Payer: Managed Care, Other (non HMO) | Admitting: Obstetrics & Gynecology

## 2018-11-14 ENCOUNTER — Encounter: Payer: Self-pay | Admitting: Family Medicine

## 2018-11-20 ENCOUNTER — Other Ambulatory Visit: Payer: Self-pay

## 2018-11-25 ENCOUNTER — Ambulatory Visit (INDEPENDENT_AMBULATORY_CARE_PROVIDER_SITE_OTHER): Payer: Managed Care, Other (non HMO) | Admitting: Obstetrics & Gynecology

## 2018-11-25 ENCOUNTER — Other Ambulatory Visit: Payer: Self-pay

## 2018-11-25 ENCOUNTER — Encounter: Payer: Self-pay | Admitting: Obstetrics & Gynecology

## 2018-11-25 VITALS — BP 138/86 | HR 64 | Temp 97.8°F | Ht 65.0 in | Wt 160.0 lb

## 2018-11-25 DIAGNOSIS — Z0000 Encounter for general adult medical examination without abnormal findings: Secondary | ICD-10-CM | POA: Diagnosis not present

## 2018-11-25 DIAGNOSIS — E7800 Pure hypercholesterolemia, unspecified: Secondary | ICD-10-CM | POA: Diagnosis not present

## 2018-11-25 DIAGNOSIS — Z124 Encounter for screening for malignant neoplasm of cervix: Secondary | ICD-10-CM

## 2018-11-25 DIAGNOSIS — Z01419 Encounter for gynecological examination (general) (routine) without abnormal findings: Secondary | ICD-10-CM | POA: Diagnosis not present

## 2018-11-25 NOTE — Progress Notes (Addendum)
58 y.o. [id number] Married White or Caucasian female here for annual exam.  Used to see provider in [place] even when moved to [place].  She commuted to [place] for the last 13 years.  She got tired of the drive.  Provider was at [facility].    She's had palpitations in the past six months.  Has seen Dr. Stanford Breed.  She did have a stress test in December.  Also had echo and wore a monitor.  Having some allergic reaction to "something" that she thinks is related to the new house she moved into.    Youngest son is autistic.  He is severe on the spectrum.  She does have lots of stressors with him.    PCP:  Dr. Elease Hashimoto.    Patient's last menstrual period was 04/02/2018 (approximate).          Sexually active: Yes.    The current method of family planning is vasectomy.    Exercising: Yes.    tennis, peloton, walking, hiking Smoker:  no  Health Maintenance: Pap:  05/2016 Neg History of abnormal Pap:  no MMG:  11/17/18 BIRADS2:benign. F/u 1 year.  On CareEverywhere. Colonoscopy: Cologuard 08/04/16 Neg.  Aware this is due next year. BMD:  ~12 years ago TDaP:  2014 Pneumonia vaccine(s):  n/a Shingrix:   D/w pt today Hep C testing: 07/17/16 neg  Screening Labs: Lipids, Vit D   reports that she has never smoked. She has never used smokeless tobacco. She reports current alcohol use of about 3.0 standard drinks of alcohol per week. She reports that she does not use drugs.  Past Medical History:  Diagnosis Date  . Allergy   . ASTHMA 04/06/2009  . HYPERTENSION 04/06/2009  . Neck pain, chronic   . UTI (urinary tract infection)     Past Surgical History:  Procedure Laterality Date  . APPENDECTOMY    . TONSILLECTOMY      Current Outpatient Medications  Medication Sig Dispense Refill  . ALPRAZolam (XANAX) 0.5 MG tablet Take 1 tablet (0.5 mg total) by mouth as needed for anxiety. 30 tablet 0  . ibuprofen (ADVIL,MOTRIN) 200 MG tablet Take 200 mg by mouth every 6 (six) hours as needed for pain.     Marland Kitchen levalbuterol (XOPENEX HFA) 45 MCG/ACT inhaler Inhale 1-2 puffs into the lungs every 4 (four) hours as needed for wheezing. 15 Inhaler 3  . levocetirizine (XYZAL) 5 MG tablet TAKE 1 TABLET BY MOUTH EVERY DAY 30 tablet 2  . metoprolol succinate (TOPROL-XL) 25 MG 24 hr tablet TAKE 1 TABLET BY MOUTH EVERY DAY 30 tablet 3  . valsartan (DIOVAN) 80 MG tablet TAKE 1 TABLET BY MOUTH EVERY DAY 30 tablet 5  . hydrochlorothiazide (MICROZIDE) 12.5 MG capsule Take 1 capsule (12.5 mg total) by mouth daily. (Patient not taking: Reported on 11/25/2018)    . triamcinolone (NASACORT) 55 MCG/ACT nasal inhaler Place 2 sprays into the nose daily.     No current facility-administered medications for this visit.     Family History  Problem Relation Age of Onset  . Hypertension Mother   . Stroke Mother   . Hypertension Father   . Dementia Father     Review of Systems  All other systems reviewed and are negative.   Exam:   BP 138/86   Pulse 64   Temp 97.8 F (36.6 C) (Temporal)   Ht 5\' 5"  (1.651 m)   Wt 160 lb (72.6 kg)   LMP 04/02/2018 (Approximate)   BMI  26.63 kg/m    Height: 5\' 5"  (165.1 cm)  Ht Readings from Last 3 Encounters:  11/25/18 5\' 5"  (1.651 m)  04/28/18 5\' 5"  (1.651 m)  03/05/18 5\' 5"  (1.651 m)    General appearance: alert, cooperative and appears stated age Head: Normocephalic, without obvious abnormality, atraumatic Neck: no adenopathy, supple, symmetrical, trachea midline and thyroid normal to inspection and palpation Lungs: clear to auscultation bilaterally Breasts: normal appearance, no masses or tenderness, implants present Heart: regular rate and rhythm Abdomen: soft, non-tender; bowel sounds normal; no masses,  no organomegaly Extremities: extremities normal, atraumatic, no cyanosis or edema Skin: Skin color, texture, turgor normal. No rashes or lesions Lymph nodes: Cervical, supraclavicular, and axillary nodes normal. No abnormal inguinal nodes palpated Neurologic:  Grossly normal   Pelvic: External genitalia:  no lesions              Urethra:  normal appearing urethra with no masses, tenderness or lesions              Bartholins and Skenes: normal                 Vagina: normal appearing vagina with normal color and discharge, no lesions              Cervix: no lesions              Pap taken: Yes.  Will wait for outside records. Bimanual Exam:  Uterus:  normal size, contour, position, consistency, mobility, non-tender              Adnexa: normal adnexa and no mass, fullness, tenderness               Rectovaginal: Confirms               Anus:  normal sphincter tone, no lesions  Chaperone was present for exam.  A:  Well Woman with normal exam PMP, no HRT Hypertension Eye itching/allergy Asthma  P:   Mammogram guidelines reviewed Outside records obtained.  Pap with neg HR HPV done 04/29/2017.  Pt desires pap smear to still be sent.  Advised do not need to order repeat HR HPV.  Pap only ordered.   Cologuard due next year.  Pt aware. BMD screening guidelines reviewed Lab work obtained today Return annually or prn

## 2018-11-25 NOTE — Addendum Note (Signed)
Addended by: Megan Salon on: 11/25/2018 02:19 PM   Modules accepted: Orders

## 2018-11-26 LAB — CBC
Hematocrit: 32.1 % — ABNORMAL LOW (ref 34.0–46.6)
Hemoglobin: 12.2 g/dL (ref 11.1–15.9)
MCH: 37 pg — ABNORMAL HIGH (ref 26.6–33.0)
MCHC: 38 g/dL — ABNORMAL HIGH (ref 31.5–35.7)
MCV: 97 fL (ref 79–97)
Platelets: 277 10*3/uL (ref 150–450)
RBC: 3.3 x10E6/uL — ABNORMAL LOW (ref 3.77–5.28)
RDW: 12.7 % (ref 11.7–15.4)
WBC: 6.5 10*3/uL (ref 3.4–10.8)

## 2018-11-26 LAB — LIPID PANEL
Chol/HDL Ratio: 2.9 ratio (ref 0.0–4.4)
Cholesterol, Total: 272 mg/dL — ABNORMAL HIGH (ref 100–199)
HDL: 95 mg/dL (ref >39)
LDL Chol Calc (NIH): 162 mg/dL — ABNORMAL HIGH (ref 0–99)
Triglycerides: 93 mg/dL (ref 0–149)
VLDL Cholesterol Cal: 15 mg/dL (ref 5–40)

## 2018-11-26 LAB — COMPREHENSIVE METABOLIC PANEL
ALT: 20 IU/L (ref 0–32)
AST: 26 IU/L (ref 0–40)
Albumin/Globulin Ratio: 1.6 (ref 1.2–2.2)
Albumin: 4.7 g/dL (ref 3.8–4.9)
Alkaline Phosphatase: 83 IU/L (ref 39–117)
BUN/Creatinine Ratio: 22 (ref 9–23)
BUN: 21 mg/dL (ref 6–24)
Bilirubin Total: 0.3 mg/dL (ref 0.0–1.2)
CO2: 21 mmol/L (ref 20–29)
Calcium: 9.5 mg/dL (ref 8.7–10.2)
Chloride: 99 mmol/L (ref 96–106)
Creatinine, Ser: 0.94 mg/dL (ref 0.57–1.00)
GFR calc Af Amer: 78 mL/min/{1.73_m2} (ref >59)
GFR calc non Af Amer: 68 mL/min/{1.73_m2} (ref >59)
Globulin, Total: 2.9 g/dL (ref 1.5–4.5)
Glucose: 95 mg/dL (ref 65–99)
Potassium: 4.9 mmol/L (ref 3.5–5.2)
Sodium: 138 mmol/L (ref 134–144)
Total Protein: 7.6 g/dL (ref 6.0–8.5)

## 2018-11-26 LAB — VITAMIN D 25 HYDROXY (VIT D DEFICIENCY, FRACTURES): Vit D, 25-Hydroxy: 30 ng/mL (ref 30.0–100.0)

## 2018-11-26 LAB — TSH: TSH: 0.787 u[IU]/mL (ref 0.450–4.500)

## 2018-11-27 ENCOUNTER — Other Ambulatory Visit (HOSPITAL_COMMUNITY)
Admission: RE | Admit: 2018-11-27 | Discharge: 2018-11-27 | Disposition: A | Discharge status: Home / Self Care | Payer: Managed Care, Other (non HMO) | Source: Ambulatory Visit | Attending: Obstetrics & Gynecology | Admitting: Obstetrics & Gynecology

## 2018-11-27 DIAGNOSIS — Z124 Encounter for screening for malignant neoplasm of cervix: Secondary | ICD-10-CM | POA: Diagnosis not present

## 2018-11-27 NOTE — Addendum Note (Signed)
Addended by: Megan Salon on: 11/27/2018 06:06 PM   Modules accepted: Orders

## 2018-12-02 ENCOUNTER — Other Ambulatory Visit (INDEPENDENT_AMBULATORY_CARE_PROVIDER_SITE_OTHER): Payer: Managed Care, Other (non HMO)

## 2018-12-02 ENCOUNTER — Other Ambulatory Visit: Payer: Self-pay

## 2018-12-02 DIAGNOSIS — E7800 Pure hypercholesterolemia, unspecified: Secondary | ICD-10-CM

## 2018-12-02 LAB — CYTOLOGY - PAP: Diagnosis: NEGATIVE

## 2018-12-03 LAB — LIPID PANEL
Chol/HDL Ratio: 2.4 ratio (ref 0.0–4.4)
Cholesterol, Total: 238 mg/dL — ABNORMAL HIGH (ref 100–199)
HDL: 100 mg/dL (ref >39)
LDL Chol Calc (NIH): 123 mg/dL — ABNORMAL HIGH (ref 0–99)
Triglycerides: 89 mg/dL (ref 0–149)
VLDL Cholesterol Cal: 15 mg/dL (ref 5–40)

## 2018-12-08 ENCOUNTER — Ambulatory Visit (INDEPENDENT_AMBULATORY_CARE_PROVIDER_SITE_OTHER): Payer: Managed Care, Other (non HMO) | Admitting: Podiatry

## 2018-12-08 ENCOUNTER — Other Ambulatory Visit: Payer: Self-pay

## 2018-12-08 ENCOUNTER — Encounter: Payer: Self-pay | Admitting: Podiatry

## 2018-12-08 ENCOUNTER — Ambulatory Visit (INDEPENDENT_AMBULATORY_CARE_PROVIDER_SITE_OTHER): Payer: Managed Care, Other (non HMO)

## 2018-12-08 DIAGNOSIS — S9031XA Contusion of right foot, initial encounter: Secondary | ICD-10-CM

## 2018-12-08 DIAGNOSIS — S93601A Unspecified sprain of right foot, initial encounter: Secondary | ICD-10-CM

## 2018-12-09 NOTE — Progress Notes (Signed)
Subjective: 58 year old female presents the office today for concerns of right foot pain and swelling.  She states on Saturday, September 19 she rolled her ankle after she slipped on the step.  She states that ankle always feels weak.  She has no pain to the ankle she points to the top of the right foot and the forefoot area where she gets majority discomfort.  She has been icing and taking Advil but she is having difficulty putting weight on her foot.Denies any systemic complaints such as fevers, chills, nausea, vomiting. No acute changes since last appointment, and no other complaints at this time.   Objective: AAO x3, NAD DP/PT pulses palpable bilaterally, CRT less than 3 seconds There is no pain to the ankle.  No pain no tibia, fibula, syndesmosis, Achilles tendon.  Achilles tendon, plantar fascial, flexor, extensor tendons appear to be intact.  There is swelling on the forefoot area in the right foot and tenderness on the second and third MPJs.  There is no pain vibratory sensation. No pain with calf compression, swelling, warmth, erythema  Assessment: Right foot contusion, ligamentous injury  Plan: -All treatment options discussed with the patient including all alternatives, risks, complications.  -X-rays were obtained reviewed.  No evidence of acute fracture or stress fracture -Given her swelling and pain recommend immobilization in a cam boot.  This was dispensed today.  Ice to the area daily.  Elevation.  Hold off on tennis for now.  Ibuprofen as needed. -Patient encouraged to call the office with any questions, concerns, change in symptoms.   Trula Slade DPM

## 2018-12-15 ENCOUNTER — Telehealth: Payer: Self-pay | Admitting: *Deleted

## 2018-12-15 NOTE — Telephone Encounter (Signed)
I informed pt of Dr. Leigh Aurora recommendation of transferring to a thick sole shoe around the house and if out of the house or for long distance to remain in the boot, rest, ice and elevate can pick up a compression anklet for more support.

## 2018-12-15 NOTE — Telephone Encounter (Signed)
Pt states she was seen 1 week ago for a possible torn ligament and put in a boot. Pt states the foot is better, and the boot really is hurting her hip and would like to know if she could transition into a regular athletic shoe.

## 2018-12-15 NOTE — Telephone Encounter (Signed)
I would transition to a stiffer sole shoe and wear compression ankle, ice and still try to stay off of the foot as much as she can and then gradually increase activity.

## 2018-12-22 ENCOUNTER — Ambulatory Visit (INDEPENDENT_AMBULATORY_CARE_PROVIDER_SITE_OTHER): Payer: Managed Care, Other (non HMO) | Admitting: Podiatry

## 2018-12-22 ENCOUNTER — Encounter: Payer: Self-pay | Admitting: Podiatry

## 2018-12-22 ENCOUNTER — Other Ambulatory Visit: Payer: Self-pay

## 2018-12-22 DIAGNOSIS — M216X1 Other acquired deformities of right foot: Secondary | ICD-10-CM | POA: Diagnosis not present

## 2018-12-22 DIAGNOSIS — M79671 Pain in right foot: Secondary | ICD-10-CM

## 2018-12-22 DIAGNOSIS — M216X2 Other acquired deformities of left foot: Secondary | ICD-10-CM

## 2018-12-22 DIAGNOSIS — S93601D Unspecified sprain of right foot, subsequent encounter: Secondary | ICD-10-CM

## 2018-12-31 ENCOUNTER — Other Ambulatory Visit: Payer: Self-pay | Admitting: Family Medicine

## 2019-01-02 ENCOUNTER — Telehealth: Payer: Self-pay | Admitting: Family Medicine

## 2019-01-02 NOTE — Telephone Encounter (Signed)
Medication: ALPRAZolam (XANAX) 0.5 MG tablet [id number]   Patient states she is trying to not come into the offices. She saw Dr. Sabra Heck 11/27/2018  Has the patient contacted their pharmacy? Yes  (Agent: If no, request that the patient contact the pharmacy for the refill.) (Agent: If yes, when and what did the pharmacy advise?)  Preferred Pharmacy (with phone number or street name): [id number] - [place], [place] [phone] (Phone) [phone] (Fax)    Agent: Please be advised that RX refills may take up to 3 business days. We ask that you follow-up with your pharmacy.

## 2019-01-05 MED ORDER — ALPRAZOLAM 0.5 MG PO TABS: 0.5000 mg | ORAL_TABLET | ORAL | 0 refills | Status: DC | PRN

## 2019-01-11 NOTE — Progress Notes (Signed)
Subjective: 58 year old female presents the office today for further evaluation of right foot injury.  She said that she has been out of the boot for last 5 to 6 days and she states is doing pretty good.  [name] doing normal activities and regular shoe but she has not yet returned to tennis.  No swelling or redness.  She has no new concerns. Denies any systemic complaints such as fevers, chills, nausea, vomiting. No acute changes since last appointment, and no other complaints at this time.   Objective: AAO x3, NAD DP/PT pulses palpable bilaterally, CRT less than 3 seconds There is no pain to the ankle.  On today's exam there is no area of tenderness identified bilaterally.  There is no edema, erythema.  There is no pain to the foot particular on the second and third MPJs.  No palpable neuroma.  Crepitus present. No pain with calf compression, swelling, warmth, erythema  Assessment: Right foot contusion, ligamentous injury-improved  Plan: -All treatment options discussed with the patient including all alternatives, risks, complications.  -Overall no pain today so did not repeat x-rays.  Otherwise she is doing well we discussed gradual increase activity level.  Discussed general stretching, rehab exercises.  Discussed orthotics and inserts for her shoes to help with her pronation.  Return if symptoms worsen or fail to improve.  Trula Slade DPM

## 2019-01-26 ENCOUNTER — Other Ambulatory Visit: Payer: Self-pay | Admitting: Family Medicine

## 2019-03-03 ENCOUNTER — Other Ambulatory Visit: Payer: Self-pay | Admitting: Family Medicine

## 2019-03-27 NOTE — Progress Notes (Signed)
HPI: FU palpitations. Echo 2/19 showed normal LV function. Event monitor 2/19 showed sinus with PVCs.  Exercise treadmill December 2019 showed a hypertensive response, no significant arrhythmias and no diagnostic ST changes. Since last seen,  patient's blood pressure is running high by her report.  She notes dyspnea with more vigorous activities and chest tightness when she is riding her palatine.  She has had similar symptoms in the past.  There is no syncope.  Current Outpatient Medications  Medication Sig Dispense Refill  . ALPRAZolam (XANAX) 0.5 MG tablet Take 1 tablet (0.5 mg total) by mouth as needed for anxiety. 30 tablet 0  . ibuprofen (ADVIL,MOTRIN) 200 MG tablet Take 200 mg by mouth every 6 (six) hours as needed for pain.    Marland Kitchen levocetirizine (XYZAL) 5 MG tablet TAKE 1 TABLET BY MOUTH EVERY DAY 30 tablet 1  . metoprolol succinate (TOPROL-XL) 25 MG 24 hr tablet TAKE 1 TABLET BY MOUTH EVERY DAY 30 tablet 1  . triamcinolone (NASACORT) 55 MCG/ACT nasal inhaler Place 2 sprays into the nose daily.    . valsartan (DIOVAN) 80 MG tablet TAKE 1 TABLET BY MOUTH EVERY DAY 30 tablet 5   No current facility-administered medications for this visit.     Past Medical History:  Diagnosis Date  . Allergy   . ASTHMA 04/06/2009  . HYPERTENSION 04/06/2009  . Neck pain, chronic     Past Surgical History:  Procedure Laterality Date  . APPENDECTOMY    . PLACEMENT OF BREAST IMPLANTS    . TONSILLECTOMY      Social History   Socioeconomic History  . Marital status: Married    Spouse name: Not on file  . Number of children: 3  . Years of education: Not on file  . Highest education level: Not on file  Occupational History    Comment: Realtor  Tobacco Use  . Smoking status: Never Smoker  . Smokeless tobacco: Never Used  Substance and Sexual Activity  . Alcohol use: Yes    Alcohol/week: 3.0 standard drinks    Types: 3 Glasses of wine per week  . Drug use: No  . Sexual activity: Yes     Comment: vasectomy   Other Topics Concern  . Not on file  Social History Narrative  . Not on file   Social Determinants of Health   Financial Resource Strain:   . Difficulty of Paying Living Expenses: Not on file  Food Insecurity:   . Worried About Charity fundraiser in the Last Year: Not on file  . Ran Out of Food in the Last Year: Not on file  Transportation Needs:   . Lack of Transportation (Medical): Not on file  . Lack of Transportation (Non-Medical): Not on file  Physical Activity:   . Days of Exercise per Week: Not on file  . Minutes of Exercise per Session: Not on file  Stress:   . Feeling of Stress : Not on file  Social Connections:   . Frequency of Communication with Friends and Family: Not on file  . Frequency of Social Gatherings with Friends and Family: Not on file  . Attends Religious Services: Not on file  . Active Member of Clubs or Organizations: Not on file  . Attends Archivist Meetings: Not on file  . Marital Status: Not on file  Intimate Partner Violence:   . Fear of Current or Ex-Partner: Not on file  . Emotionally Abused: Not on file  .  Physically Abused: Not on file  . Sexually Abused: Not on file    Family History  Problem Relation Age of Onset  . Hypertension Mother   . Stroke Mother   . Hypertension Father   . Dementia Father     ROS: no fevers or chills, productive cough, hemoptysis, dysphasia, odynophagia, melena, hematochezia, dysuria, hematuria, rash, seizure activity, orthopnea, PND, pedal edema, claudication. Remaining systems are negative.  Physical Exam: Well-developed well-nourished in no acute distress.  Skin is warm and dry.  HEENT is normal.  Neck is supple.  Chest is clear to auscultation with normal expansion.  Cardiovascular exam is regular rate and rhythm.  Abdominal exam nontender or distended. No masses palpated. Extremities show no edema. neuro grossly intact  ECG-sinus rhythm at a rate of 67, no ST  changes.  RV conduction delay.  Personally reviewed  A/P  1 palpitations-symptoms well controlled.  Continue beta-blocker at present dose.  Previous treadmill showed no significant arrhythmias with activities.  2 hypertension-patient's blood pressure is elevated.  Increase valsartan to 160 mg daily.  Check potassium and renal function in 1 week.  Follow blood pressure at home and return to see one of our pharmacists for further adjustment.  I have asked her to bring her cuff for correlation with ours and keep records of her blood pressure as well.  Kirk Ruths, MD

## 2019-04-01 ENCOUNTER — Ambulatory Visit (INDEPENDENT_AMBULATORY_CARE_PROVIDER_SITE_OTHER): Payer: Managed Care, Other (non HMO) | Admitting: Cardiology

## 2019-04-01 ENCOUNTER — Other Ambulatory Visit: Payer: Self-pay

## 2019-04-01 ENCOUNTER — Encounter: Payer: Self-pay | Admitting: Cardiology

## 2019-04-01 VITALS — BP 156/100 | HR 67 | Ht 65.0 in | Wt 167.0 lb

## 2019-04-01 DIAGNOSIS — I10 Essential (primary) hypertension: Secondary | ICD-10-CM | POA: Diagnosis not present

## 2019-04-01 DIAGNOSIS — R002 Palpitations: Secondary | ICD-10-CM

## 2019-04-01 MED ORDER — VALSARTAN 160 MG PO TABS: 160.0000 mg | ORAL_TABLET | Freq: Every day | ORAL | 3 refills | Status: DC

## 2019-04-01 NOTE — Patient Instructions (Signed)
Medication Instructions:  INCREASE VALSARTAN TO 160 MG ONCE DAILY= 2 OF THE 80 MG TABLETS ONCE DAILY  *If you need a refill on your cardiac medications before your next appointment, please call your pharmacy*  Lab Work: Your physician recommends that you return for lab work in: [place]  If you have labs (blood work) drawn today and your tests are completely normal, you will receive your results only by: Marland Kitchen MyChart Message (if you have MyChart) OR . A paper copy in the mail If you have any lab test that is abnormal or we need to change your treatment, we will call you to review the results.  Follow-Up: At [facility], you and your health needs are our priority.  As part of our continuing mission to provide you with exceptional heart care, we have created designated Provider Care Teams.  These Care Teams include your primary Cardiologist (physician) and Advanced Practice Providers (APPs -  Physician Assistants and Nurse Practitioners) who all work together to provide you with the care you need, when you need it.  Your next appointment:   Your physician recommends that you schedule a follow-up appointment in: [place]  Your physician recommends that you schedule a follow-up appointment in: [place]

## 2019-04-03 ENCOUNTER — Telehealth: Payer: Self-pay | Admitting: Cardiology

## 2019-04-03 MED ORDER — HYDRALAZINE HCL 25 MG PO TABS: ORAL_TABLET | ORAL | 3 refills | Status: DC

## 2019-04-03 NOTE — Telephone Encounter (Signed)
Spoke to patient B/P appointment with pharmacist moved up to 1/19 at 10:30 am.Advised to monitor B/P daily and bring readings to appointment.

## 2019-04-03 NOTE — Telephone Encounter (Signed)
Called pt and she stated that she was working from home and yesterday her BP went up. She stated she was having headache and dizziness while it was up. Asked her if she was taking her prescribed medications. Stated that Dr. Stanford Breed increased her Valsartan to 160mg  instead of 80mg . Stated she took 80mg  in the morning and the other 80mg  in the evening. Stated she felt a lot better today. Advised pt to take the whole 160mg  dose together in the AM and check her BP again in a few hours. Pt stated her current BP was 150/110 and she would take her dose asap and wait a few hours and if it doesn't come down that she call our office back.

## 2019-04-03 NOTE — Telephone Encounter (Signed)
Spoke to patient she stated B/P still elevated 178/99 pulse 58.Stated Valsartan increased to 160 mg 2 days ago.Stated B/P is going up yesterday 200/100.Spoke to pharmacy Raquel she advised to take Hydralazine 25 mg twice a day if needed for systolic B/P 0000000 or greater.Advised to take now and in 2 hours if B/P 160 or greater can take another 25 mg.Stated she has Hydralazine from last year and she took one yesterday with no results.Patient stated she has 12.5 mg she took 2 tablets now.Advised Hydralazine does not come in 12.5 mg.Patient checked bottle stated it is HCTZ 12.5 mg.Raquel advised ok to take Hydralazine 25 mg now.Prescription sent to pharmacy.Advised to continue to monitor and call Dr.on call this weekend if B/P remains elevated.

## 2019-04-03 NOTE — Telephone Encounter (Signed)
Pt c/o BP issue: STAT if pt c/o blurred vision, one-sided weakness or slurred speech  1. What are your last 5 BP readings?    150/100  195/110  158/100  198/110  158/100    2. Are you having any other symptoms (ex. Dizziness, headache, blurred vision, passed out)? Headaches, dizziness  3. What is your BP issue? BP is elevated  Patient states she was advised to double intake of Valsartin. Patient states she believes medication increase is cause of elevated BP. Please call and advise.

## 2019-04-07 ENCOUNTER — Other Ambulatory Visit: Payer: Self-pay | Admitting: Family Medicine

## 2019-04-07 ENCOUNTER — Ambulatory Visit (INDEPENDENT_AMBULATORY_CARE_PROVIDER_SITE_OTHER): Payer: Managed Care, Other (non HMO) | Admitting: Pharmacist Clinician (PhC)/ Clinical Pharmacy Specialist

## 2019-04-07 ENCOUNTER — Encounter: Payer: Self-pay | Admitting: Pharmacist Clinician (PhC)/ Clinical Pharmacy Specialist

## 2019-04-07 ENCOUNTER — Other Ambulatory Visit: Payer: Self-pay

## 2019-04-07 DIAGNOSIS — Z79899 Other long term (current) drug therapy: Secondary | ICD-10-CM

## 2019-04-07 DIAGNOSIS — I10 Essential (primary) hypertension: Secondary | ICD-10-CM

## 2019-04-07 MED ORDER — AMLODIPINE BESYLATE 5 MG PO TABS: 5.0000 mg | ORAL_TABLET | Freq: Every day | ORAL | 3 refills | Status: DC

## 2019-04-07 NOTE — Assessment & Plan Note (Signed)
Patient with systolic/diastolic hypertension, not at goal.  She was concerned about being "resistant" to medications, but explained that this is not the case - she has yet to be on maximum doses of any medications.  She has been drinking tea with licorice at least 2-3 times per day, and this may be the cause of her recent spikes.  I have asked that she stop all teas containing any amount of licorice.  She will also need to start amlodipine 5 mg daily in the mornings and continue with the valsartan, moving it to 6 pm daily.   She has an older bottle of hctz 12.5 mg capsules and I will have her take 2 of these (25 mg) for pressure > 170 or > 110.  It was also stressed to her that it may take upwards of a week before her pressure starts to come down with the new regimen, so she should not worry too much.  She was told to take her pressure only twice daily and given written instructions on technique.  She will record these readings on a log and bring that back when she returns in 2 weeks.

## 2019-04-07 NOTE — Progress Notes (Signed)
04/07/2019 Lori Love 01-Sep-1960 [id number]   HPI:  Lori Love is a 59 y.o. female patient of Dr Stanford Breed, with a PMH below who presents today for hypertension clinic evaluation.  In addition to hypertension, her medical history is significant only for palpitations, asthma and vitamin D deficiency.  When she saw Dr. Stanford Breed earlier this month her pressure was elevated at 156/100.  She was talking valsartan 80 mg and metoprolol succinate 25 mg.  Her palpitations appear to be well controlled on the metoprolol, so he increased her valsartan to 160 mg daily.  She called the office 2 days later (last Friday) to report no change in her readings.  She was given hydralazine 25 mg to take not more than twice daily as needed for systolic pressure > 0000000.    Today she returns for follow up.  It has been just 1 week since she was seen, however, with her home readings as elevated as they were, she had concerns.  She did note a few readings < [id number] systolic, but always after she took 0.125 or .25 mg or alprazolam.  She has this for anxiety issues and notes that she usually gets a full year out of a bottle of 30 tabs.  When her pressure was so high, she became anxious and decided to see if this would help.  Otherwise, her home readings are still running [id number] systolic and A999333 diastolic.  [name] worried that the valsartan is not effective.    Blood Pressure Goal:  130/80  Current Medications: valsartan 160 mg qd, metoprolol succ 25 mg qd, hydralazine 25 mg prn - only took 1-2 doses (made her feel bad);  Previously was on hctz 12.5 mg - took 2 capsules one day in the past week when pressure elevated.    Family Hx: mom died at 47 stroke (2 total)  Social Hx: wine 2-3 glasses per day; no tobacco, no coffee recently, hot tea most days - several varieties, all containing licorice.   Diet: home cooked very little salt added; ate pizza befre seeing crensahw; halibut with rice, grille chicken, muffins for  breakfast  Exercise: no currnetly because fear of hypertension; no tennis recently; has [place] but afraid to use right now  Home BP readings:  Omron cuff, read within 10 points of office cuff: Systolic only at goal with alprazolam at this time, all diastolic readings still > 80.   Intolerances: sulfa, penicillin  Labs:  11/2018:  Na 138, K 4.9, Glu 95, BUN 21, SCr 0.94  Wt Readings from Last 3 Encounters:  04/01/19 167 lb (75.8 kg)  11/25/18 160 lb (72.6 kg)  04/28/18 163 lb 3.2 oz (74 kg)   BP Readings from Last 3 Encounters:  04/07/19 (!) 146/88  04/01/19 (!) 156/100  11/25/18 138/86   Pulse Readings from Last 3 Encounters:  04/07/19 64  04/01/19 67  11/25/18 64    Current Outpatient Medications  Medication Sig Dispense Refill  . ALPRAZolam (XANAX) 0.5 MG tablet Take 1 tablet (0.5 mg total) by mouth as needed for anxiety. 30 tablet 0  . amLODipine (NORVASC) 5 MG tablet Take 1 tablet (5 mg total) by mouth daily. 30 tablet 3  . hydrALAZINE (APRESOLINE) 25 MG tablet Take 25 mg twice a day if needed for systolic B/P 0000000 or greater 30 tablet 3  . ibuprofen (ADVIL,MOTRIN) 200 MG tablet Take 200 mg by mouth every 6 (six) hours as needed for pain.    Marland Kitchen levocetirizine (XYZAL) 5  MG tablet TAKE 1 TABLET BY MOUTH EVERY DAY 30 tablet 1  . metoprolol succinate (TOPROL-XL) 25 MG 24 hr tablet TAKE 1 TABLET BY MOUTH EVERY DAY 30 tablet 1  . triamcinolone (NASACORT) 55 MCG/ACT nasal inhaler Place 2 sprays into the nose daily.    . valsartan (DIOVAN) 160 MG tablet Take 1 tablet (160 mg total) by mouth daily. 90 tablet 3   No current facility-administered medications for this visit.    Allergies  Allergen Reactions  . Penicillins Hives    REACTION: hives  . Sulfa Antibiotics Rash    Past Medical History:  Diagnosis Date  . Allergy   . ASTHMA 04/06/2009  . HYPERTENSION 04/06/2009  . Neck pain, chronic     Blood pressure (!) 146/88, pulse 64.  Hypertension Patient with  systolic/diastolic hypertension, not at goal.  She was concerned about being "resistant" to medications, but explained that this is not the case - she has yet to be on maximum doses of any medications.  She has been drinking tea with licorice at least 2-3 times per day, and this may be the cause of her recent spikes.  I have asked that she stop all teas containing any amount of licorice.  She will also need to start amlodipine 5 mg daily in the mornings and continue with the valsartan, moving it to 6 pm daily.   She has an older bottle of hctz 12.5 mg capsules and I will have her take 2 of these (25 mg) for pressure > 170 or > 110.  It was also stressed to her that it may take upwards of a week before her pressure starts to come down with the new regimen, so she should not worry too much.  She was told to take her pressure only twice daily and given written instructions on technique.  She will record these readings on a log and bring that back when she returns in 2 weeks.     Tommy Medal PharmD CPP [facility] [address] [place], [place] [address] [phone]

## 2019-04-07 NOTE — Patient Instructions (Addendum)
Return for a a follow up appointment February 9  Stop drinking any teas with liccorice  Go to the lab today  Check your blood pressure at home daily (if able) and keep record of the readings.  Take your BP meds as follows:  AM:  Amlodipine 5 mg  PM:  Valsartan 160 mg   If your systolic > 123XX123 or diastolic > A999333, then take 2 of the hydrochlorothiazide 12.5 mg capsules  Bring all of your meds, your BP cuff and your record of home blood pressures to your next appointment.  Exercise as you're able, try to walk approximately 30 minutes per day.  Keep salt intake to a minimum, especially watch canned and prepared boxed foods.  Eat more fresh fruits and vegetables and fewer canned items.  Avoid eating in fast food restaurants.    HOW TO TAKE YOUR BLOOD PRESSURE: . Rest 5 minutes before taking your blood pressure. .  Don't smoke or drink caffeinated beverages for at least 30 minutes before. . Take your blood pressure before (not after) you eat. . Sit comfortably with your back supported and both feet on the floor (don't cross your legs). . Elevate your arm to heart level on a table or a desk. . Use the proper sized cuff. It should fit smoothly and snugly around your bare upper arm. There should be enough room to slip a fingertip under the cuff. The bottom edge of the cuff should be 1 inch above the crease of the elbow. . Ideally, take 3 measurements at one sitting and record the average.

## 2019-04-08 LAB — BASIC METABOLIC PANEL
BUN/Creatinine Ratio: 16 (ref 9–23)
BUN: 15 mg/dL (ref 6–24)
CO2: 24 mmol/L (ref 20–29)
Calcium: 9.8 mg/dL (ref 8.7–10.2)
Chloride: 104 mmol/L (ref 96–106)
Creatinine, Ser: 0.92 mg/dL (ref 0.57–1.00)
GFR calc Af Amer: 80 mL/min/{1.73_m2} (ref >59)
GFR calc non Af Amer: 70 mL/min/{1.73_m2} (ref >59)
Glucose: 86 mg/dL (ref 65–99)
Potassium: 4.8 mmol/L (ref 3.5–5.2)
Sodium: 140 mmol/L (ref 134–144)

## 2019-04-28 ENCOUNTER — Ambulatory Visit (INDEPENDENT_AMBULATORY_CARE_PROVIDER_SITE_OTHER): Payer: Managed Care, Other (non HMO) | Admitting: Pharmacist

## 2019-04-28 ENCOUNTER — Other Ambulatory Visit: Payer: Self-pay

## 2019-04-28 ENCOUNTER — Ambulatory Visit: Payer: Managed Care, Other (non HMO)

## 2019-04-28 VITALS — BP 114/68 | HR 62 | Ht 65.0 in | Wt 165.8 lb

## 2019-04-28 DIAGNOSIS — I10 Essential (primary) hypertension: Secondary | ICD-10-CM

## 2019-04-28 MED ORDER — AMLODIPINE BESYLATE 5 MG PO TABS: 5.0000 mg | ORAL_TABLET | Freq: Every day | ORAL | 3 refills | Status: DC | PRN

## 2019-04-28 NOTE — Patient Instructions (Addendum)
Return for a follow up appointment AS NEEDED  Check your blood pressure at home daily (if able) and keep record of the readings.  Take your BP meds as follows: *NO MEDICATION CHANGE*  Bring all of your meds, your BP cuff and your record of home blood pressures to your next appointment.  Exercise as you're able, try to walk approximately 30 minutes per day.  Keep salt intake to a minimum, especially watch canned and prepared boxed foods.  Eat more fresh fruits and vegetables and fewer canned items.  Avoid eating in fast food restaurants.    HOW TO TAKE YOUR BLOOD PRESSURE: . Rest 5 minutes before taking your blood pressure. .  Don't smoke or drink caffeinated beverages for at least 30 minutes before. . Take your blood pressure before (not after) you eat. . Sit comfortably with your back supported and both feet on the floor (don't cross your legs). . Elevate your arm to heart level on a table or a desk. . Use the proper sized cuff. It should fit smoothly and snugly around your bare upper arm. There should be enough room to slip a fingertip under the cuff. The bottom edge of the cuff should be 1 inch above the crease of the elbow. . Ideally, take 3 measurements at one sitting and record the average.

## 2019-04-28 NOTE — Progress Notes (Signed)
HPI:  Lori Love is a 59 y.o. female patient of Dr Stanford Breed, with a PMH below who presents today for hypertension clinic follow up.  In addition to hypertension, her medical history is significant for palpitations, asthma and vitamin D deficiency.  During last OV with HTN clinic we noticed she was drinking 2-3 cups of tea with licorice. We intrusted her to stop taking licorice and to start taking amlodipine 5mg  daily. Today she presents for further assessment and medication titration. She reports stopping amlodipine few weeks ago and stable BP readings without the medication.  Blood Pressure Goal:  130/80  Current Medications:  valsartan 160 mg daily metoprolol succ 25 mg daily hydralazine 25 mg BID PRN for systolic BP [id number] Amlodipine 5mg  daily - not taking  Family Hx: mom died at 73 stroke (2 total)  Social Hx: wine 2-3 glasses per day; no tobacco, no coffee recently, hot tea most days - several varieties, all containing licorice.   Diet: home cooked very little salt added; ate pizza befre seeing crensahw; halibut with rice, grille chicken, muffins for breakfast  Exercise: no currnetly because fear of hypertension; no tennis recently; has [place] but afraid to use right now  Home BP readings:   20 readings; average 115/78; HR range 54-68bpm  Intolerances: sulfa, penicillin  Labs:  11/2018:  Na 138, K 4.9, Glu 95, BUN 21, SCr 0.94  Wt Readings from Last 3 Encounters:  04/28/19 165 lb 12.8 oz (75.2 kg)  04/01/19 167 lb (75.8 kg)  11/25/18 160 lb (72.6 kg)   BP Readings from Last 3 Encounters:  04/28/19 114/68  04/07/19 (!) 146/88  04/01/19 (!) 156/100   Pulse Readings from Last 3 Encounters:  04/28/19 62  04/07/19 64  04/01/19 67    Current Outpatient Medications  Medication Sig Dispense Refill  . ALPRAZolam (XANAX) 0.5 MG tablet Take 1 tablet (0.5 mg total) by mouth as needed for anxiety. 30 tablet 0  . amLODipine (NORVASC) 5 MG tablet Take 1 tablet (5 mg total) by  mouth daily as needed. 30 tablet 3  . hydrALAZINE (APRESOLINE) 25 MG tablet Take 25 mg twice a day if needed for systolic B/P 0000000 or greater 30 tablet 3  . ibuprofen (ADVIL,MOTRIN) 200 MG tablet Take 200 mg by mouth every 6 (six) hours as needed for pain.    Marland Kitchen levocetirizine (XYZAL) 5 MG tablet Take 1 tablet (5 mg total) by mouth daily. Needs a follow up for further refills. [phone] 30 tablet 0  . metoprolol succinate (TOPROL-XL) 25 MG 24 hr tablet TAKE 1 TABLET BY MOUTH EVERY DAY 30 tablet 1  . triamcinolone (NASACORT) 55 MCG/ACT nasal inhaler Place 2 sprays into the nose daily.    . valsartan (DIOVAN) 160 MG tablet Take 1 tablet (160 mg total) by mouth daily. 90 tablet 3   No current facility-administered medications for this visit.    Allergies  Allergen Reactions  . Penicillins Hives    REACTION: hives  . Sulfa Antibiotics Rash    Past Medical History:  Diagnosis Date  . Allergy   . ASTHMA 04/06/2009  . HYPERTENSION 04/06/2009  . Neck pain, chronic     Blood pressure 114/68, pulse 62, height 5\' 5"  (1.651 m), weight 165 lb 12.8 oz (75.2 kg), SpO2 98 %.  Hypertension Blood pressure is well controlled during OV. Home BP average is at 115/78 and patient denies problems with current medication. She was able to stop taking amlodioine about 1 week after she  stopped taking licorice tea. Denies need for PRN hydralazine as well.  Will continue valsartan, metoprolol and hydralazine PRN and follow up with HTN clinic as needed.   Seldon Barrell Rodriguez-Guzman PharmD, BCPS, [place] [address] 05/03/2019 4:23 PM

## 2019-05-03 NOTE — Assessment & Plan Note (Signed)
Blood pressure is well controlled during OV. Home BP average is at 115/78 and patient denies problems with current medication. She was able to stop taking amlodioine about 1 week after she stopped taking licorice tea. Denies need for PRN hydralazine as well.  Will continue valsartan, metoprolol and hydralazine PRN and follow up with HTN clinic as needed.

## 2019-05-08 ENCOUNTER — Other Ambulatory Visit: Payer: Self-pay | Admitting: Family Medicine

## 2019-05-11 ENCOUNTER — Other Ambulatory Visit: Payer: Self-pay

## 2019-05-11 MED ORDER — METOPROLOL SUCCINATE ER 25 MG PO TB24: 25.0000 mg | ORAL_TABLET | Freq: Every day | ORAL | 3 refills | Status: DC

## 2019-05-11 NOTE — Telephone Encounter (Signed)
Refill Lori Love  

## 2019-07-02 NOTE — Progress Notes (Signed)
HPI: FU palpitations. Echo 2/19 showed normal LV function. Event monitor 2/19 showed sinus with PVCs.  Exercise treadmill December 2019 showed a hypertensive response, no significant arrhythmias and no diagnostic ST changes. Since last seen,the patient denies any dyspnea on exertion, orthopnea, PND, pedal edema, palpitations, syncope or chest pain.   Current Outpatient Medications  Medication Sig Dispense Refill  . ALPRAZolam (XANAX) 0.5 MG tablet Take 1 tablet (0.5 mg total) by mouth as needed for anxiety. 30 tablet 0  . hydrALAZINE (APRESOLINE) 25 MG tablet Take 25 mg twice a day if needed for systolic B/P 0000000 or greater 30 tablet 3  . ibuprofen (ADVIL,MOTRIN) 200 MG tablet Take 200 mg by mouth every 6 (six) hours as needed for pain.    Marland Kitchen levocetirizine (XYZAL) 5 MG tablet Take 1 tablet (5 mg total) by mouth daily. Needs a follow up for further refills. [phone] 30 tablet 0  . metoprolol succinate (TOPROL-XL) 25 MG 24 hr tablet Take 1 tablet (25 mg total) by mouth daily. 90 tablet 3  . triamcinolone (NASACORT) 55 MCG/ACT nasal inhaler Place 2 sprays into the nose daily.    . valsartan (DIOVAN) 160 MG tablet Take 1 tablet (160 mg total) by mouth daily. 90 tablet 3   No current facility-administered medications for this visit.     Past Medical History:  Diagnosis Date  . Allergy   . ASTHMA 04/06/2009  . HYPERTENSION 04/06/2009  . Neck pain, chronic     Past Surgical History:  Procedure Laterality Date  . APPENDECTOMY    . PLACEMENT OF BREAST IMPLANTS    . TONSILLECTOMY      Social History   Socioeconomic History  . Marital status: Married    Spouse name: Not on file  . Number of children: 3  . Years of education: Not on file  . Highest education level: Not on file  Occupational History    Comment: Realtor  Tobacco Use  . Smoking status: Never Smoker  . Smokeless tobacco: Never Used  Substance and Sexual Activity  . Alcohol use: Yes    Alcohol/week: 3.0  standard drinks    Types: 3 Glasses of wine per week  . Drug use: No  . Sexual activity: Yes    Comment: vasectomy   Other Topics Concern  . Not on file  Social History Narrative  . Not on file   Social Determinants of Health   Financial Resource Strain:   . Difficulty of Paying Living Expenses:   Food Insecurity:   . Worried About Charity fundraiser in the Last Year:   . Arboriculturist in the Last Year:   Transportation Needs:   . Film/video editor (Medical):   Marland Kitchen Lack of Transportation (Non-Medical):   Physical Activity:   . Days of Exercise per Week:   . Minutes of Exercise per Session:   Stress:   . Feeling of Stress :   Social Connections:   . Frequency of Communication with Friends and Family:   . Frequency of Social Gatherings with Friends and Family:   . Attends Religious Services:   . Active Member of Clubs or Organizations:   . Attends Archivist Meetings:   Marland Kitchen Marital Status:   Intimate Partner Violence:   . Fear of Current or Ex-Partner:   . Emotionally Abused:   Marland Kitchen Physically Abused:   . Sexually Abused:     Family History  Problem Relation Age of Onset  .  Hypertension Mother   . Stroke Mother   . Hypertension Father   . Dementia Father     ROS: no fevers or chills, productive cough, hemoptysis, dysphasia, odynophagia, melena, hematochezia, dysuria, hematuria, rash, seizure activity, orthopnea, PND, pedal edema, claudication. Remaining systems are negative.  Physical Exam: Well-developed well-nourished in no acute distress.  Skin is warm and dry.  HEENT is normal.  Neck is supple.  Chest is clear to auscultation with normal expansion.  Cardiovascular exam is regular rate and rhythm.  Abdominal exam nontender or distended. No masses palpated. Extremities show no edema. neuro grossly intact  A/P  1 hypertension-patient's blood pressure is controlled.  Continue present medications.  2 palpitations-symptoms are controlled.   Continue beta-blocker at present dose.  Kirk Ruths, MD

## 2019-07-08 ENCOUNTER — Ambulatory Visit (INDEPENDENT_AMBULATORY_CARE_PROVIDER_SITE_OTHER): Payer: Managed Care, Other (non HMO) | Admitting: Cardiology

## 2019-07-08 ENCOUNTER — Encounter: Payer: Self-pay | Admitting: Cardiology

## 2019-07-08 ENCOUNTER — Other Ambulatory Visit: Payer: Self-pay

## 2019-07-08 VITALS — BP 128/84 | HR 66 | Ht 65.0 in | Wt 165.8 lb

## 2019-07-08 DIAGNOSIS — R002 Palpitations: Secondary | ICD-10-CM | POA: Diagnosis not present

## 2019-07-08 DIAGNOSIS — I10 Essential (primary) hypertension: Secondary | ICD-10-CM

## 2019-07-08 NOTE — Patient Instructions (Signed)
Medication Instructions:  NO CHANGE *If you need a refill on your cardiac medications before your next appointment, please call your pharmacy*   Lab Work: If you have labs (blood work) drawn today and your tests are completely normal, you will receive your results only by: . MyChart Message (if you have MyChart) OR . A paper copy in the mail If you have any lab test that is abnormal or we need to change your treatment, we will call you to review the results.  Follow-Up: At [facility], you and your health needs are our priority.  As part of our continuing mission to provide you with exceptional heart care, we have created designated Provider Care Teams.  These Care Teams include your primary Cardiologist (physician) and Advanced Practice Providers (APPs -  Physician Assistants and Nurse Practitioners) who all work together to provide you with the care you need, when you need it.  We recommend signing up for the patient portal called "MyChart".  Sign up information is provided on this After Visit Summary.  MyChart is used to connect with patients for Virtual Visits (Telemedicine).  Patients are able to view lab/test results, encounter notes, upcoming appointments, etc.  Non-urgent messages can be sent to your provider as well.   To learn more about what you can do with MyChart, go to [email or web address].    Your next appointment:   12 month(s)  The format for your next appointment:   Either In Person or Virtual  Provider:   Brian Crenshaw, MD    

## 2019-08-13 ENCOUNTER — Telehealth: Payer: Self-pay | Admitting: Cardiology

## 2019-08-13 DIAGNOSIS — I10 Essential (primary) hypertension: Secondary | ICD-10-CM

## 2019-08-13 NOTE — Telephone Encounter (Signed)
Pt c/o medication issue:  1. Name of Medication: valsartan (DIOVAN) 160 MG tablet  2. How are you currently taking this medication (dosage and times per day)? 160 mg daily  3. Are you having a reaction (difficulty breathing--STAT)? no  4. What is your medication issue? Pt says she has been feeling faint and fatigue. She took her blood pressure and 103/60. She ate some salt with her lunch and feels better now.   Dr. Stanford Breed adjusted the dosage of this medication recently and she just wanted to know if she should take the full dose today or go back to the lower dose that she was taking before. Please advise

## 2019-08-13 NOTE — Telephone Encounter (Signed)
I spoke to the patient who called because her Valsartan had been increased to (160 mg Daily @ 6 pm) back in January.  She had been fine, but yesterday 5/26 she was feeling very tired.    Today, 5/27 she was lightheaded and "dizzy to a point of nearly fainting".  She took her BP and it was 103/60, low for her, she stated.  She ate extra salt for lunch and is feeling better.    I told her to stay hydrated.  [name] going to only take Valsartan 80 mg tonight and monitor symptoms/BP until hearing back on 5/28 with any further advisement.

## 2019-08-13 NOTE — Telephone Encounter (Signed)
Change valsartan to 80 mg daily and follow BP Kirk Ruths

## 2019-08-14 MED ORDER — VALSARTAN 80 MG PO TABS: 80.0000 mg | ORAL_TABLET | Freq: Every day | ORAL | 3 refills | Status: DC

## 2019-08-14 NOTE — Telephone Encounter (Signed)
Left message for patient with Dr Youlanda Roys recommendations.  New script sent to the pharmacy and she is to call back with questions or concerns.

## 2019-09-16 ENCOUNTER — Telehealth: Payer: Self-pay | Admitting: Obstetrics & Gynecology

## 2019-09-16 NOTE — Telephone Encounter (Signed)
AEX 11/25/2018, next scheduled for 01/29/20  Spoke with pt. Pt states having no cycle in 1 year, but started having brown, light pink bleeding on 09/14/19. Pt states only wearing panty liner and is changing it 2-3 times a day. Pt denies vaginal discharge, odor, UTI sx, vasomotor sx. Pt states unsure if in menopause, has not had labs done. Pt did state having some vaginal dryness in the past, but then had normal moister and then began bleeding for cycle.  Denies heavy bleeding or clots.  Pt advised to have OV for further evaluation.  Pt agreeable. Pt declines appt this week. Pt scheduled with Dr Sabra Heck on 7/7 at 130 pm. Pt agreeable and verbalized understanding of date and time of appt. Pt advised to monitor and calendar bleeding and ER precautions given for heavy bleeding. Pt agreeable.   Routing to Dr Sabra Heck for review  Encounter closed.

## 2019-09-16 NOTE — Telephone Encounter (Signed)
Patient is having a cycle after years of no cycle.

## 2019-09-23 ENCOUNTER — Encounter: Payer: Self-pay | Admitting: Obstetrics & Gynecology

## 2019-09-23 ENCOUNTER — Ambulatory Visit (INDEPENDENT_AMBULATORY_CARE_PROVIDER_SITE_OTHER): Payer: Managed Care, Other (non HMO) | Admitting: Obstetrics & Gynecology

## 2019-09-23 ENCOUNTER — Other Ambulatory Visit: Payer: Self-pay

## 2019-09-23 ENCOUNTER — Ambulatory Visit: Payer: Self-pay | Admitting: Obstetrics & Gynecology

## 2019-09-23 VITALS — BP 128/82 | HR 84 | Wt 165.0 lb

## 2019-09-23 DIAGNOSIS — N950 Postmenopausal bleeding: Secondary | ICD-10-CM | POA: Diagnosis not present

## 2019-09-23 NOTE — Progress Notes (Signed)
GYNECOLOGY  VISIT  CC:   PMP bleeding  HPI: 59 y.o. [id number] Married White or Caucasian female here for no menses in over a year.  Has mild hot flashes and she does have some vaginal dryness.  Noticed some increased vaginal moisture that has been present for 4 -5 weeks.  Then she noticed some dark brown bleeding on 09/14/2019. Reports bleeding was very light and lasted for 3 days.  This felt like a normal cycle to her that was like the last few years of menstrual cycles.  Changing panty liner every 6-8 hours. Denies any cramping.  Denies breast tenderness or acne.    GYNECOLOGIC HISTORY: Patient's last menstrual period was 09/14/2019 (exact date). Contraception: spouse with vasectomy Menopausal hormone therapy: None  Patient Active Problem List   Diagnosis Date Noted  . Lateral epicondylitis of right elbow 03/03/2015  . Right elbow pain 03/03/2015  . Vitamin D deficiency 11/25/2009  . Hypertension 04/06/2009  . ASTHMA 04/06/2009  . Asthma 10/21/2008  . Positive ANA (antinuclear antibody) 10/21/2008  . Anxiety 04/17/2007  . Palpitations 04/10/2007  . Scoliosis 04/07/2007    Past Medical History:  Diagnosis Date  . Allergy   . ASTHMA 04/06/2009  . HYPERTENSION 04/06/2009  . Neck pain, chronic     Past Surgical History:  Procedure Laterality Date  . APPENDECTOMY    . PLACEMENT OF BREAST IMPLANTS    . TONSILLECTOMY      MEDS:   Current Outpatient Medications on File Prior to Visit  Medication Sig Dispense Refill  . ALPRAZolam (XANAX) 0.5 MG tablet Take 1 tablet (0.5 mg total) by mouth as needed for anxiety. 30 tablet 0  . metoprolol succinate (TOPROL-XL) 25 MG 24 hr tablet Take 1 tablet (25 mg total) by mouth daily. 90 tablet 3  . valsartan (DIOVAN) 80 MG tablet Take 1 tablet (80 mg total) by mouth daily. 90 tablet 3  . hydrALAZINE (APRESOLINE) 25 MG tablet Take 25 mg twice a day if needed for systolic B/P 833 or greater (Patient not taking: Reported on 09/23/2019) 30 tablet 3   . ibuprofen (ADVIL,MOTRIN) 200 MG tablet Take 200 mg by mouth every 6 (six) hours as needed for pain. (Patient not taking: Reported on 09/23/2019)    . levocetirizine (XYZAL) 5 MG tablet Take 1 tablet (5 mg total) by mouth daily. Needs a follow up for further refills. [phone] (Patient not taking: Reported on 09/23/2019) 30 tablet 0  . triamcinolone (NASACORT) 55 MCG/ACT nasal inhaler Place 2 sprays into the nose daily. (Patient not taking: Reported on 09/23/2019)     No current facility-administered medications on file prior to visit.    ALLERGIES: Penicillins and Sulfa antibiotics  Family History  Problem Relation Age of Onset  . Hypertension Mother   . Stroke Mother   . Hypertension Father   . Dementia Father     SH:  Married, non smoker  Review of Systems  Constitutional: Negative.   HENT: Negative.   Eyes: Negative.   Respiratory: Negative.   Cardiovascular: Negative.   Gastrointestinal: Negative.   Endocrine: Negative.   Genitourinary: Positive for vaginal bleeding.  Musculoskeletal: Negative.   Skin: Negative.   Allergic/Immunologic: Negative.   Neurological: Negative.   Hematological: Negative.   Psychiatric/Behavioral: Negative.     PHYSICAL EXAMINATION:    BP 128/82 (BP Location: Right Arm, Patient Position: Sitting, Cuff Size: Normal)   Pulse 84   Wt 165 lb (74.8 kg)   LMP 09/14/2019 (Exact Date)   BMI  27.46 kg/m     General appearance: alert, cooperative and appears stated age Lymph:  no inguinal LAD noted  Pelvic: External genitalia:  no lesions              Urethra:  normal appearing urethra with no masses, tenderness or lesions              Bartholins and Skenes: normal                 Vagina: normal appearing vagina with normal color and discharge, no lesions              Cervix: no lesions              Bimanual Exam:  Uterus:  normal size, contour, position, consistency, mobility, non-tender              Adnexa: no mass, fullness,  tenderness  Chaperone, Kaitlyn Sprague, RN, was present for exam.  Assessment: PMP bleeding  Plan: Endometrial evaluation recommended with PUS or endometrial biopsy.  Pt really does not want a biopsy today.  Will obtain FSH and estradiol.  If in menopausal range, she will need to return for endometrial evaluation.  Pt comfortable with plan.

## 2019-09-24 ENCOUNTER — Telehealth: Payer: Self-pay

## 2019-09-24 DIAGNOSIS — N950 Postmenopausal bleeding: Secondary | ICD-10-CM

## 2019-09-24 LAB — ESTRADIOL: Estradiol: <5 pg/mL

## 2019-09-24 LAB — FOLLICLE STIMULATING HORMONE: FSH: 80.7 m[IU]/mL

## 2019-09-24 NOTE — Telephone Encounter (Signed)
Call to patient. Per DPR, OK to leave message on voicemail.   Left voicemail requesting a return call to [facility] to review benefits for scheduled Pelvic ultrasound with Jerilynn Mages. Edwinna Areola, MD

## 2019-09-24 NOTE — Telephone Encounter (Signed)
Spoke with pt. Pt given results and recommendations per Dr Sabra Heck. Pt agreeable to have PUS. Pt scheduled for PUS on 7/22 at 130pm then OV to follow with Dr Sabra Heck. Pt agreeable and verbalized understanding of date and time of appt. Advised pt will review with Dr Sabra Heck and return call if needs EMB same day as PUS. Pt agreeable. Pt aware of call for benefits.   Routing to Dr Sabra Heck Cc: Alfonse Spruce for precert. Orders placed.

## 2019-09-24 NOTE — Telephone Encounter (Signed)
-----   Message from Megan Salon, MD sent at 09/24/2019  7:22 AM EDT ----- Please let pt know her Detmold was 80 and estradiol level was <5.0.  This is menopausal range.  We discussed she needed endometrial evaluation if these levels showed menopause so either an endometrial biopsy or ultrasound is needed now.  Please schedule.

## 2019-09-25 NOTE — Telephone Encounter (Signed)
Spoke with patient regarding benefits for recommended ultrasound. Patient acknowledges understanding of information presented. 

## 2019-10-08 ENCOUNTER — Other Ambulatory Visit: Payer: Self-pay

## 2019-10-08 ENCOUNTER — Ambulatory Visit (INDEPENDENT_AMBULATORY_CARE_PROVIDER_SITE_OTHER): Payer: Managed Care, Other (non HMO)

## 2019-10-08 ENCOUNTER — Ambulatory Visit (INDEPENDENT_AMBULATORY_CARE_PROVIDER_SITE_OTHER): Payer: Managed Care, Other (non HMO) | Admitting: Obstetrics & Gynecology

## 2019-10-08 ENCOUNTER — Encounter: Payer: Self-pay | Admitting: Obstetrics & Gynecology

## 2019-10-08 VITALS — BP 120/80 | HR 68 | Resp 16 | Wt 165.0 lb

## 2019-10-08 DIAGNOSIS — R9389 Abnormal findings on diagnostic imaging of other specified body structures: Secondary | ICD-10-CM | POA: Insufficient documentation

## 2019-10-08 DIAGNOSIS — N950 Postmenopausal bleeding: Secondary | ICD-10-CM | POA: Diagnosis not present

## 2019-10-08 NOTE — Progress Notes (Signed)
59 y.o. [id number] Married White or Caucasian female here for pelvic ultrasound due to PMP bleeding.  She declined endometrial biopsy and desires ultrasound first.  Bleeding has stopped.  Denies cramping.  Patient's last menstrual period was 09/14/2019 (exact date).  Contraception: PMP  Findings:  UTERUS: 7.8 x 4.5 x 4.5cm with 1.1 x 0.7cm fibroid noted today EMS: 9.94mm with 9 x 47mm focus ADNEXA: Left ovary:  2.7 x 2.1 x 1.0cm       Right ovary: 3.4 x 1.3 x 0.8cm.  Atrophic appearance noted bilaterally  CUL DE SAC:  No free fluid  Discussion:  Ultrasonographer supervised.  Ultrasound results reviewed.  83mm endometrium and 82mm focus c/w polyp noted.  Due to bleeding, thickened endometrium and echogenic focus, hysteroscopic polyp resection with D&C recommended.  Procedure discussed with patient.  Recovery and pain management discussed.  Risks discussed including but not limited to bleeding, rare risk of transfusion, infection, 1% risk of uterine perforation with risks of fluid deficit causing cardiac arrythmia, cerebral swelling and/or need to stop procedure early.  Fluid emboli and rare risk of death discussed.  DVT/PE, rare risk of risk of bowel/bladder/ureteral/vascular injury.  Patient aware if pathology abnormal she may need additional treatment.  All questions answered.    Assessment:  PMP bleeding Thickened endometrium Probable endometrial polyp  Plan:  Hysteroscopy with polyp resection, D&C recommended.    32 minutes spent in total with pt

## 2019-10-09 ENCOUNTER — Telehealth: Payer: Self-pay | Admitting: Obstetrics & Gynecology

## 2019-10-09 NOTE — Telephone Encounter (Signed)
Spoke with patient regarding surgery benefits. Patient acknowledges understanding of information presented. Patient is aware that benefits presented are for professional benefits only. Patient is aware that once surgery is scheduled, the hospital will call with separate benefits. Patient is aware of surgery cancellation policy. ° °Patient is ready to proceed with scheduling. °

## 2019-10-12 NOTE — Telephone Encounter (Addendum)
Spoke with patient. Hysteroscopy D&C Myosure polyp resection scheduled for 11/03/2019 at 9:45 am at [facility]. COVID test scheduled for 10/30/2019 at 3:10 pm at [place] location. Patient is aware of the need to quarantine after test until surgery. 2 week post op scheduled for 11/16/2019 at 4:30 pm with Dr.Miller. Patient is agreeable to all dates and times. Surgery instructions reviewed and mailed to verified home address on file.  Routing to provider and will close encounter.

## 2019-10-13 ENCOUNTER — Other Ambulatory Visit: Payer: Self-pay | Admitting: Obstetrics & Gynecology

## 2019-10-26 ENCOUNTER — Encounter (HOSPITAL_BASED_OUTPATIENT_CLINIC_OR_DEPARTMENT_OTHER): Payer: Self-pay | Admitting: Obstetrics & Gynecology

## 2019-10-26 ENCOUNTER — Other Ambulatory Visit: Payer: Self-pay

## 2019-10-26 NOTE — Progress Notes (Signed)
Spoke w/ via phone for pre-op interview--- PT Lab needs dos----no              Lab results------ getting CBC,BMP done 10-30-2019 @ 1400;  Current ekg in epic/ chart COVID test ------ 10-30-2019 @ 1510 Arrive at ------- 0745 NPO after MN NO Solid Food.  Clear liquids from MN until--- 0645 Medications to take morning of surgery ----- NONE Diabetic medication ----- n/a Patient Special Instructions ----- asked to bring rescue inhaler Pre-Op special Istructions ----- n/a Patient verbalized understanding of instructions that were given at this phone interview. Patient denies shortness of breath, chest pain, fever, cough at this phone interview.     PCP:  Dr Carolann Littler (lov 04/28/2018 epic) Cardiologist :  Dr Stanford Breed (lov 07-08-2019 epic for palpiations/ htn) Chest x-ray : no EKG :  04-01-2019 epic Echo :  05-16-2017 epic Event monitor:  05-16-2017 epic Stress test:  ETT 03-06-2018 epic Cardiac Cath :   no Activity level:   No sob with any activity except with seasonal asthma Sleep Study/ CPAP :  NO Fasting Blood Sugar :      / Checks Blood Sugar -- times a day:  N/A Blood Thinner/ Instructions Maryjane Hurter Dose: NO ASA / Instructions/ Last Dose :  NO

## 2019-10-30 ENCOUNTER — Other Ambulatory Visit (HOSPITAL_COMMUNITY)
Admission: RE | Admit: 2019-10-30 | Discharge: 2019-10-30 | Disposition: A | Discharge status: Home / Self Care | Payer: Managed Care, Other (non HMO) | Source: Ambulatory Visit | Attending: Obstetrics & Gynecology | Admitting: Obstetrics & Gynecology

## 2019-10-30 ENCOUNTER — Other Ambulatory Visit: Payer: Self-pay

## 2019-10-30 ENCOUNTER — Encounter (HOSPITAL_COMMUNITY)
Admission: RE | Admit: 2019-10-30 | Discharge: 2019-10-30 | Disposition: A | Discharge status: Home / Self Care | Payer: Managed Care, Other (non HMO) | Source: Ambulatory Visit | Attending: Obstetrics & Gynecology | Admitting: Obstetrics & Gynecology

## 2019-10-30 DIAGNOSIS — Z01812 Encounter for preprocedural laboratory examination: Secondary | ICD-10-CM | POA: Insufficient documentation

## 2019-10-30 LAB — BASIC METABOLIC PANEL
Anion gap: 9 (ref 5–15)
BUN: 16 mg/dL (ref 6–20)
CO2: 26 mmol/L (ref 22–32)
Calcium: 9.6 mg/dL (ref 8.9–10.3)
Chloride: 104 mmol/L (ref 98–111)
Creatinine, Ser: 0.87 mg/dL (ref 0.44–1.00)
GFR calc Af Amer: >60 mL/min (ref >60)
GFR calc non Af Amer: >60 mL/min (ref >60)
Glucose, Bld: 110 mg/dL — ABNORMAL HIGH (ref 70–99)
Potassium: 4.8 mmol/L (ref 3.5–5.1)
Sodium: 139 mmol/L (ref 135–145)

## 2019-10-30 LAB — CBC
HCT: 41 % (ref 36.0–46.0)
Hemoglobin: 12.9 g/dL (ref 12.0–15.0)
MCH: 31.2 pg (ref 26.0–34.0)
MCHC: 31.5 g/dL (ref 30.0–36.0)
MCV: 99.3 fL (ref 80.0–100.0)
Platelets: 293 10*3/uL (ref 150–400)
RBC: 4.13 MIL/uL (ref 3.87–5.11)
RDW: 12.1 % (ref 11.5–15.5)
WBC: 6.4 10*3/uL (ref 4.0–10.5)
nRBC: 0 % (ref 0.0–0.2)

## 2019-11-02 ENCOUNTER — Other Ambulatory Visit (HOSPITAL_COMMUNITY)
Admission: RE | Admit: 2019-11-02 | Discharge: 2019-11-02 | Disposition: A | Discharge status: Home / Self Care | Payer: Managed Care, Other (non HMO) | Source: Ambulatory Visit | Attending: Obstetrics & Gynecology | Admitting: Obstetrics & Gynecology

## 2019-11-02 DIAGNOSIS — Z01812 Encounter for preprocedural laboratory examination: Secondary | ICD-10-CM | POA: Diagnosis present

## 2019-11-02 DIAGNOSIS — Z20822 Contact with and (suspected) exposure to covid-19: Secondary | ICD-10-CM | POA: Diagnosis not present

## 2019-11-02 LAB — SARS CORONAVIRUS 2 (TAT 6-24 HRS): SARS Coronavirus 2: NEGATIVE

## 2019-11-03 ENCOUNTER — Other Ambulatory Visit: Payer: Self-pay | Admitting: Obstetrics & Gynecology

## 2019-11-03 ENCOUNTER — Ambulatory Visit (HOSPITAL_BASED_OUTPATIENT_CLINIC_OR_DEPARTMENT_OTHER): Payer: Managed Care, Other (non HMO) | Admitting: Anesthesiology

## 2019-11-03 ENCOUNTER — Other Ambulatory Visit: Payer: Self-pay

## 2019-11-03 ENCOUNTER — Encounter (HOSPITAL_BASED_OUTPATIENT_CLINIC_OR_DEPARTMENT_OTHER)
Admission: RE | Disposition: A | Discharge status: Home / Self Care | Payer: Self-pay | Source: Home / Self Care | Attending: Obstetrics & Gynecology

## 2019-11-03 ENCOUNTER — Ambulatory Visit (HOSPITAL_BASED_OUTPATIENT_CLINIC_OR_DEPARTMENT_OTHER)
Admission: RE | Admit: 2019-11-03 | Discharge: 2019-11-03 | Disposition: A | Discharge status: Home / Self Care | Payer: Managed Care, Other (non HMO) | Attending: Obstetrics & Gynecology | Admitting: Obstetrics & Gynecology

## 2019-11-03 ENCOUNTER — Encounter (HOSPITAL_BASED_OUTPATIENT_CLINIC_OR_DEPARTMENT_OTHER): Payer: Self-pay | Admitting: Obstetrics & Gynecology

## 2019-11-03 DIAGNOSIS — Z882 Allergy status to sulfonamides status: Secondary | ICD-10-CM | POA: Insufficient documentation

## 2019-11-03 DIAGNOSIS — F419 Anxiety disorder, unspecified: Secondary | ICD-10-CM | POA: Diagnosis not present

## 2019-11-03 DIAGNOSIS — R9389 Abnormal findings on diagnostic imaging of other specified body structures: Secondary | ICD-10-CM | POA: Diagnosis not present

## 2019-11-03 DIAGNOSIS — I10 Essential (primary) hypertension: Secondary | ICD-10-CM | POA: Insufficient documentation

## 2019-11-03 DIAGNOSIS — N950 Postmenopausal bleeding: Secondary | ICD-10-CM | POA: Insufficient documentation

## 2019-11-03 DIAGNOSIS — N858 Other specified noninflammatory disorders of uterus: Secondary | ICD-10-CM | POA: Insufficient documentation

## 2019-11-03 DIAGNOSIS — Z880 Allergy status to penicillin: Secondary | ICD-10-CM | POA: Insufficient documentation

## 2019-11-03 DIAGNOSIS — Z79899 Other long term (current) drug therapy: Secondary | ICD-10-CM | POA: Diagnosis not present

## 2019-11-03 DIAGNOSIS — J45909 Unspecified asthma, uncomplicated: Secondary | ICD-10-CM | POA: Diagnosis not present

## 2019-11-03 DIAGNOSIS — M189 Osteoarthritis of first carpometacarpal joint, unspecified: Secondary | ICD-10-CM | POA: Insufficient documentation

## 2019-11-03 HISTORY — DX: Unspecified osteoarthritis, unspecified site: M19.90

## 2019-11-03 HISTORY — PX: DILATATION & CURETTAGE/HYSTEROSCOPY WITH MYOSURE: SHX6511

## 2019-11-03 HISTORY — DX: Postmenopausal bleeding: N95.0

## 2019-11-03 HISTORY — DX: Other specified conditions associated with female genital organs and menstrual cycle: N94.89

## 2019-11-03 HISTORY — DX: Essential (primary) hypertension: I10

## 2019-11-03 HISTORY — DX: Other seasonal allergic rhinitis: J30.2

## 2019-11-03 HISTORY — DX: Other asthma: J45.998

## 2019-11-03 HISTORY — DX: Palpitations: R00.2

## 2019-11-03 HISTORY — DX: Polyp of corpus uteri: N84.0

## 2019-11-03 SURGERY — DILATATION & CURETTAGE/HYSTEROSCOPY WITH MYOSURE
Anesthesia: General | Site: Vagina

## 2019-11-03 MED ORDER — SCOPOLAMINE 1 MG/3DAYS TD PT72
MEDICATED_PATCH | TRANSDERMAL | Status: AC
  Filled 2019-11-03: qty 1

## 2019-11-03 MED ORDER — OXYCODONE HCL 5 MG/5ML PO SOLN: 5.0000 mg | Freq: Once | ORAL | Status: DC | PRN

## 2019-11-03 MED ORDER — MIDAZOLAM HCL 2 MG/2ML IJ SOLN
INTRAMUSCULAR | Status: AC
  Filled 2019-11-03: qty 2

## 2019-11-03 MED ORDER — FENTANYL CITRATE (PF) 100 MCG/2ML IJ SOLN
25.0000 ug | INTRAMUSCULAR | Status: DC | PRN
  Administered 2019-11-03 (×2): 50 ug via INTRAVENOUS

## 2019-11-03 MED ORDER — DEXAMETHASONE SODIUM PHOSPHATE 10 MG/ML IJ SOLN
INTRAMUSCULAR | Status: AC
  Filled 2019-11-03: qty 1

## 2019-11-03 MED ORDER — OXYCODONE HCL 5 MG PO TABS: 5.0000 mg | ORAL_TABLET | Freq: Once | ORAL | Status: DC | PRN

## 2019-11-03 MED ORDER — KETOROLAC TROMETHAMINE 30 MG/ML IJ SOLN
INTRAMUSCULAR | Status: AC
  Filled 2019-11-03: qty 1

## 2019-11-03 MED ORDER — FENTANYL CITRATE (PF) 100 MCG/2ML IJ SOLN
INTRAMUSCULAR | Status: AC
  Filled 2019-11-03: qty 2

## 2019-11-03 MED ORDER — ONDANSETRON HCL 4 MG/2ML IJ SOLN
INTRAMUSCULAR | Status: DC | PRN
  Administered 2019-11-03: 4 mg via INTRAVENOUS

## 2019-11-03 MED ORDER — POVIDONE-IODINE 10 % EX SWAB: 2.0000 "application " | Freq: Once | CUTANEOUS | Status: DC

## 2019-11-03 MED ORDER — PROPOFOL 10 MG/ML IV BOLUS
INTRAVENOUS | Status: AC
  Filled 2019-11-03: qty 20

## 2019-11-03 MED ORDER — LIDOCAINE 2% (20 MG/ML) 5 ML SYRINGE
INTRAMUSCULAR | Status: AC
  Filled 2019-11-03: qty 5

## 2019-11-03 MED ORDER — PROPOFOL 10 MG/ML IV BOLUS
INTRAVENOUS | Status: DC | PRN
  Administered 2019-11-03: 150 mg via INTRAVENOUS

## 2019-11-03 MED ORDER — KETOROLAC TROMETHAMINE 30 MG/ML IJ SOLN
INTRAMUSCULAR | Status: DC | PRN
Start: 2019-11-03 — End: 2019-11-03
  Administered 2019-11-03: 30 mg via INTRAVENOUS

## 2019-11-03 MED ORDER — SODIUM CHLORIDE 0.9 % IR SOLN
Status: DC | PRN
  Administered 2019-11-03: 3000 mL

## 2019-11-03 MED ORDER — DEXAMETHASONE SODIUM PHOSPHATE 10 MG/ML IJ SOLN
INTRAMUSCULAR | Status: DC | PRN
  Administered 2019-11-03: 10 mg via INTRAVENOUS

## 2019-11-03 MED ORDER — LACTATED RINGERS IV SOLN: INTRAVENOUS | Status: DC

## 2019-11-03 MED ORDER — ONDANSETRON HCL 4 MG/2ML IJ SOLN
INTRAMUSCULAR | Status: AC
  Filled 2019-11-03: qty 2

## 2019-11-03 MED ORDER — MEPERIDINE HCL 25 MG/ML IJ SOLN: 6.2500 mg | INTRAMUSCULAR | Status: DC | PRN

## 2019-11-03 MED ORDER — FENTANYL CITRATE (PF) 100 MCG/2ML IJ SOLN
INTRAMUSCULAR | Status: DC | PRN
  Administered 2019-11-03: 25 ug via INTRAVENOUS
  Administered 2019-11-03: 50 ug via INTRAVENOUS
  Administered 2019-11-03: 25 ug via INTRAVENOUS

## 2019-11-03 MED ORDER — HYDROCODONE-ACETAMINOPHEN 5-325 MG PO TABS: 1.0000 | ORAL_TABLET | Freq: Four times a day (QID) | ORAL | 0 refills | Status: DC | PRN

## 2019-11-03 MED ORDER — LIDOCAINE-EPINEPHRINE 1 %-1:100000 IJ SOLN
INTRAMUSCULAR | Status: DC | PRN
  Administered 2019-11-03: 10 mL

## 2019-11-03 MED ORDER — ONDANSETRON HCL 4 MG/2ML IJ SOLN: 4.0000 mg | Freq: Once | INTRAMUSCULAR | Status: DC | PRN

## 2019-11-03 MED ORDER — LIDOCAINE 2% (20 MG/ML) 5 ML SYRINGE
INTRAMUSCULAR | Status: DC | PRN
  Administered 2019-11-03: 60 mg via INTRAVENOUS

## 2019-11-03 MED ORDER — MIDAZOLAM HCL 5 MG/5ML IJ SOLN
INTRAMUSCULAR | Status: DC | PRN
  Administered 2019-11-03: 2 mg via INTRAVENOUS

## 2019-11-03 MED ORDER — SCOPOLAMINE 1 MG/3DAYS TD PT72
1.0000 | MEDICATED_PATCH | TRANSDERMAL | Status: DC
  Administered 2019-11-03: 1.5 mg via TRANSDERMAL

## 2019-11-03 SURGICAL SUPPLY — 24 items
BIPOLAR CUTTING LOOP 21FR (ELECTRODE)
CANISTER SUCT 3000ML PPV (MISCELLANEOUS) IMPLANT
CATH ROBINSON RED A/P 16FR (CATHETERS) ×2 IMPLANT
COVER WAND RF STERILE (DRAPES) ×2 IMPLANT
DEVICE MYOSURE LITE (MISCELLANEOUS) IMPLANT
DEVICE MYOSURE REACH (MISCELLANEOUS) IMPLANT
DILATOR CANAL MILEX (MISCELLANEOUS) ×2 IMPLANT
GAUZE 4X4 16PLY RFD (DISPOSABLE) ×2 IMPLANT
GLOVE BIOGEL PI IND STRL 7.0 (GLOVE) ×1 IMPLANT
GLOVE BIOGEL PI INDICATOR 7.0 (GLOVE) ×1
GLOVE ECLIPSE 6.5 STRL STRAW (GLOVE) ×4 IMPLANT
GOWN STRL REUS W/ TWL LRG LVL3 (GOWN DISPOSABLE) ×1 IMPLANT
GOWN STRL REUS W/ TWL XL LVL3 (GOWN DISPOSABLE) ×1 IMPLANT
GOWN STRL REUS W/TWL LRG LVL3 (GOWN DISPOSABLE) ×2
GOWN STRL REUS W/TWL XL LVL3 (GOWN DISPOSABLE) ×2
IV NS IRRIG 3000ML ARTHROMATIC (IV SOLUTION) ×2 IMPLANT
KIT PROCEDURE FLUENT (KITS) ×2 IMPLANT
KIT TURNOVER CYSTO (KITS) ×2 IMPLANT
LOOP CUTTING BIPOLAR 21FR (ELECTRODE) IMPLANT
PACK VAGINAL MINOR WOMEN LF (CUSTOM PROCEDURE TRAY) ×2 IMPLANT
PAD OB MATERNITY 4.3X12.25 (PERSONAL CARE ITEMS) ×2 IMPLANT
SEAL CERVICAL OMNI LOK (ABLATOR) IMPLANT
SEAL ROD LENS SCOPE MYOSURE (ABLATOR) ×2 IMPLANT
WATER STERILE IRR 500ML POUR (IV SOLUTION) ×2 IMPLANT

## 2019-11-03 NOTE — Discharge Instructions (Addendum)
Post Anesthesia Home Care Instructions  Activity: Get plenty of rest for the remainder of the day. A responsible adult should stay with you for 24 hours following the procedure.  For the next 24 hours, DO NOT: -Drive a car -Paediatric nurse -Drink alcoholic beverages -Take any medication unless instructed by your physician -Make any legal decisions or sign important papers.  Meals: Start with liquid foods such as gelatin or soup. Progress to regular foods as tolerated. Avoid greasy, spicy, heavy foods. If nausea and/or vomiting occur, drink only clear liquids until the nausea and/or vomiting subsides. Call your physician if vomiting continues.  Special Instructions/Symptoms: Your throat may feel dry or sore from the anesthesia or the breathing tube placed in your throat during surgery. If this causes discomfort, gargle with warm salt water. The discomfort should disappear within 24 hours.  If you had a scopolamine patch placed behind your ear for the management of post- operative nausea and/or vomiting:  1. The medication in the patch is effective for 72 hours, after which it should be removed.  Wrap patch in a tissue and discard in the trash. Wash hands thoroughly with soap and water. 2. You may remove the patch earlier than 72 hours if you experience unpleasant side effects which may include dry mouth, dizziness or visual disturbances. 3. Avoid touching the patch. Wash your hands with soap and water after contact with the patch.   DISCHARGE INSTRUCTIONS: D&C / D&E The following instructions have been prepared to help you care for yourself upon your return home.   Personal hygiene: Marland Kitchen Use sanitary pads for vaginal drainage, not tampons. . Shower the day after your procedure. . NO tub baths, pools or Jacuzzis for 2-3 weeks. . Wipe front to back after using the bathroom.  Activity and limitations: . Do NOT drive or operate any equipment for 24 hours. The effects of anesthesia are  still present and drowsiness may result. . Do NOT rest in bed all day. . Walking is encouraged. . Walk up and down stairs slowly. . You may resume your normal activity in one to two days or as indicated by your physician.  Sexual activity: NO intercourse for at least 2 weeks after the procedure, or as indicated by your physician.  Diet: Eat a light meal as desired this evening. You may resume your usual diet tomorrow.  Return to work: You may resume your work activities in one to two days or as indicated by your doctor.  What to expect after your surgery: Expect to have vaginal bleeding/discharge for 2-3 days and spotting for up to 10 days. It is not unusual to have soreness for up to 1-2 weeks. You may have a slight burning sensation when you urinate for the first day. Mild cramps may continue for a couple of days. You may have a regular period in 2-6 weeks.  Call your doctor for any of the following: . Excessive vaginal bleeding, saturating and changing one pad every hour. . Inability to urinate 6 hours after discharge from hospital. . Pain not relieved by pain medication. . Fever of 100.4 F or greater. . Unusual vaginal discharge or odor.   Call for an appointment:     Post-surgical Instructions, Outpatient Surgery  You may expect to feel dizzy, weak, and drowsy for as long as 24 hours after receiving the medicine that made you sleep (anesthetic). For the first 24 hours after your surgery:    Do not drive a car, ride a bicycle, participate  in physical activities, or take public transportation until you are done taking narcotic pain medicines or as directed by Dr. Sabra Heck.   Do not drink alcohol or take tranquilizers.   Do not take medicine that has not been prescribed by your physicians.   Do not sign important papers or make important decisions while on narcotic pain medicines.   Have a responsible person with you.   PAIN MANAGEMENT  Motrin 800mg .  (This is the same as  4-200mg  over the counter tablets of Motrin or ibuprofen.)  You may take this every eight hours or as needed for cramping.    Vicodin 5/325mg .  For more severe pain, take one or two tablets every four to six hours as needed for pain control.  (Remember that narcotic pain medications increase your risk of constipation.  If this becomes a problem, you may take an over the counter stool softener like Colace 100mg  up to four times a day.)  DO'S AND DON'T'S  Do not take a tub bath for one week.  You may shower on the first day after your surgery  Do not do any heavy lifting for one to two weeks.  This increases the chance of bleeding.  Do move around as you feel able.  Stairs are fine.  You may begin to exercise again as you feel able.  Do not lift any weights for two weeks.  Do not put anything in the vagina for two weeks--no tampons, intercourse, or douching.    REGULAR MEDIATIONS/VITAMINS:  You may restart all of your regular medications as prescribed.  You may restart all of your vitamins as you normally take them.    PLEASE CALL OR SEEK MEDICAL CARE IF:  You have persistent nausea and vomiting.   You have trouble eating or drinking.   You have an oral temperature above 100.5.   You have constipation that is not helped by adjusting diet or increasing fluid intake. Pain medicines are a common cause of constipation.   You have heavy vaginal bleeding

## 2019-11-03 NOTE — Anesthesia Procedure Notes (Signed)
Procedure Name: LMA Insertion Date/Time: 11/03/2019 9:31 AM Performed by: Talbot Grumbling, CRNA Pre-anesthesia Checklist: Patient identified, Emergency Drugs available, Suction available and Patient being monitored Patient Re-evaluated:Patient Re-evaluated prior to induction Oxygen Delivery Method: Circle system utilized Preoxygenation: Pre-oxygenation with 100% oxygen Induction Type: IV induction Ventilation: Mask ventilation without difficulty LMA: LMA inserted LMA Size: 4.0 Number of attempts: 1 Placement Confirmation: positive ETCO2 and breath sounds checked- equal and bilateral Tube secured with: Tape Dental Injury: Teeth and Oropharynx as per pre-operative assessment

## 2019-11-03 NOTE — Anesthesia Preprocedure Evaluation (Signed)
Anesthesia Evaluation  Patient identified by MRN, date of birth, ID band Patient awake    Reviewed: Allergy & Precautions, NPO status , Patient's Chart, lab work & pertinent test results, reviewed documented beta blocker date and time   Airway Mallampati: II  TM Distance: >3 FB Neck ROM: Full    Dental no notable dental hx. (+) Teeth Intact   Pulmonary asthma ,    Pulmonary exam normal breath sounds clear to auscultation       Cardiovascular hypertension, Pt. on medications Normal cardiovascular exam Rhythm:Regular Rate:Normal     Neuro/Psych Anxiety negative neurological ROS     GI/Hepatic negative GI ROS, Neg liver ROS,   Endo/Other  negative endocrine ROS  Renal/GU negative Renal ROS  negative genitourinary   Musculoskeletal  (+) Arthritis , Osteoarthritis,  Hx/o + ANA   Abdominal (+) + obese,   Peds  Hematology negative hematology ROS (+)   Anesthesia Other Findings   Reproductive/Obstetrics PMB Thickened endometrium                             Anesthesia Physical Anesthesia Plan  ASA: II  Anesthesia Plan: General   Post-op Pain Management:    Induction: Intravenous  PONV Risk Score and Plan: 4 or greater and Midazolam, Ondansetron, Dexamethasone, Treatment may vary due to age or medical condition and Scopolamine patch - Pre-op  Airway Management Planned: LMA  Additional Equipment: None  Intra-op Plan:   Post-operative Plan: Extubation in OR  Informed Consent: I have reviewed the patients History and Physical, chart, labs and discussed the procedure including the risks, benefits and alternatives for the proposed anesthesia with the patient or authorized representative who has indicated his/her understanding and acceptance.     Dental advisory given  Plan Discussed with: CRNA and Anesthesiologist  Anesthesia Plan Comments:         Anesthesia Quick  Evaluation

## 2019-11-03 NOTE — Anesthesia Postprocedure Evaluation (Signed)
Anesthesia Post Note  Patient: Lori Love  Procedure(s) Performed: DILATATION & CURETTAGE/HYSTEROSCOPY WITH MYOSURE (N/A Vagina )     Patient location during evaluation: PACU Anesthesia Type: General Level of consciousness: awake and alert and oriented Pain management: pain level controlled Vital Signs Assessment: post-procedure vital signs reviewed and stable Respiratory status: spontaneous breathing, nonlabored ventilation and respiratory function stable Cardiovascular status: blood pressure returned to baseline and stable Postop Assessment: no apparent nausea or vomiting Anesthetic complications: no   No complications documented.  Last Vitals:  Vitals:   11/03/19 1030 11/03/19 1045  BP: (!) 150/86 122/72  Pulse: (!) 57 61  Resp: 12 11  Temp:    SpO2: 100% 100%    Last Pain:  Vitals:   11/03/19 0824  TempSrc: Oral  PainSc: 0-No pain                 Jelissa Espiritu A.

## 2019-11-03 NOTE — Op Note (Signed)
11/03/2019  10:03 AM  PATIENT:  Lori Love  59 y.o. female  PRE-OPERATIVE DIAGNOSIS:  Postmenopausal bleeding, thickened endometrium  POST-OPERATIVE DIAGNOSIS:  Postmenopausal bleeding, thickened endometrium  PROCEDURE:  Procedure(s): HYSTEROSCOPY WITH DILATION AND CURETTAGE  SURGEON:  Megan Salon  ASSISTANTS: OR staff   ANESTHESIA:   general  ESTIMATED BLOOD LOSS: 10 mL  BLOOD ADMINISTERED:none   FLUIDS: 500cc LR  UOP: 75 cc clear UOP  SPECIMEN:  Endometrial curettgings  DISPOSITION OF SPECIMEN:  PATHOLOGY  FINDINGS: no endometrial polyp, area of fluffy endometrium removed with D&C  DESCRIPTION OF OPERATION: Patient was taken to the operating room.  [name] placed in the supine position. SCDs were on her lower extremities and functioning properly. General anesthesia with an LMA was administered without difficulty. Dr. Royce Macadamia, anesthesia, oversaw case.  Legs were then placed in the [place] in the low lithotomy position. The legs were lifted to the high lithotomy position and the Betadine prep was used on the inner thighs perineum and vagina x3. Patient was draped in a normal standard fashion. An in and out catheterization with a red rubber Foley catheter was performed. Approximately 75 cc of clear urine was noted. A bivalve speculum was placed the vagina. The anterior lip of the cervix was grasped with single-tooth tenaculum.  A paracervical block of 1% lidocaine mixed one-to-one with epinephrine (1:100,000 units).  10 cc was used total. The cervix is dilated up to #21 [facility] dilators. The endometrial cavity sounded to 8 cm.   A Myosure hysteroscope was obtained. Normal saline was used as a hysteroscopic fluid. The hysteroscope was advanced through the endocervical canal into the endometrial cavity. The tubal ostia were noted bilaterally. Additional findings included fluffy appearing endometrium but no polyp was present.  The hysteroscope was removed. A #1 toothed  curette was used to curette the cavity until rough gritty texture was noted in all quadrants. With revisualization of the hysteroscope, there was no longer any abnormal findings.  At this point no other procedure was needed and this procedure was ended. The hysteroscope was removed. The fluid deficit was 80 cc. The tenaculum was removed from the anterior lip of the cervix. The speculum was removed from the vagina. The prep was cleansed of the patient's skin. The legs are positioned back in the supine position. Sponge, lap, needle, initially counts were correct x2. Patient was taken to recovery in stable condition.  COUNTS:  YES  PLAN OF CARE: Transfer to PACU

## 2019-11-03 NOTE — Transfer of Care (Signed)
Immediate Anesthesia Transfer of Care Note  Patient: Lori Love  Procedure(s) Performed: DILATATION & CURETTAGE/HYSTEROSCOPY WITH MYOSURE (N/A Vagina )  Patient Location: PACU  Anesthesia Type:General  Level of Consciousness: sedated  Airway & Oxygen Therapy: Patient Spontanous Breathing and Patient connected to face mask oxygen  Post-op Assessment: Report given to RN and Post -op Vital signs reviewed and stable  Post vital signs: Reviewed and stable  Last Vitals:  Vitals Value Taken Time  BP    Temp 36.3 C 11/03/19 1008  Pulse 71 11/03/19 1008  Resp 10 11/03/19 1008  SpO2 100 % 11/03/19 1008  Vitals shown include unvalidated device data.  Last Pain:  Vitals:   11/03/19 0824  TempSrc: Oral  PainSc: 0-No pain      Patients Stated Pain Goal: 5 (21/11/73 5670)  Complications: No complications documented.

## 2019-11-03 NOTE — H&P (Signed)
Lori Love is an 59 y.o. female G10P3 MWF here for hysteroscopy with polyp resection, D&C due to PMP bleeding and 43mm endometrial lesion that was noted on ultrasound.  Due to bleeding and endometrial polyps.  Risks, benefits, and alternatives have been discussed.  [name] here and ready to proceed.   Pertinent Gynecological History: Menses: post-menopausal Bleeding: post menopausal bleeding Contraception: post menopausal status DES exposure: denies Blood transfusions: none Sexually transmitted diseases: no past history Previous GYN Procedures: D&C for retained placenta  Last mammogram: normal Date: 10/2018 Last pap: normal Date: 11/2018 OB History: G5, P3   Menstrual History: No LMP recorded (exact date). Patient is postmenopausal.    Past Medical History:  Diagnosis Date  . Arthritis    thumbs  . Endometrial mass   . Endometrial polyp   . Hypertension    followed by pcp and cardiology   . Neck pain, chronic   . Palpitations    cardiologist-- dr Stanford Breed---  ETT 03-06-2018 in epic normal with no significant arrythmia;  echo 05-16-2017 normal ;  event monitor 05-16-2017 SR with PVCs  . PMB (postmenopausal bleeding)   . Seasonal allergies   . Seasonal asthma    followd by pcp    Past Surgical History:  Procedure Laterality Date  . APPENDECTOMY  1996  . DILATION AND CURETTAGE OF UTERUS  2003   retained placenta post SVD  . PLACEMENT OF BREAST IMPLANTS Bilateral 1998 approx.  . TONSILLECTOMY AND ADENOIDECTOMY  child    Family History  Problem Relation Age of Onset  . Hypertension Mother   . Stroke Mother   . Hypertension Father   . Dementia Father     Social History:  reports that she has never smoked. She has never used smokeless tobacco. She reports current alcohol use of about 7.0 - 14.0 standard drinks of alcohol per week. She reports that she does not use drugs.  Allergies:  Allergies  Allergen Reactions  . Penicillins Hives  . Sulfa Antibiotics Rash     Medications Prior to Admission  Medication Sig Dispense Refill Last Dose  . ALPRAZolam (XANAX) 0.5 MG tablet Take 1 tablet (0.5 mg total) by mouth as needed for anxiety. 30 tablet 0 Past Week at Unknown time  . ibuprofen (ADVIL,MOTRIN) 200 MG tablet Take 200 mg by mouth every 6 (six) hours as needed for pain.    Past Week at Unknown time  . metoprolol succinate (TOPROL-XL) 25 MG 24 hr tablet Take 1 tablet (25 mg total) by mouth daily. (Patient taking differently: Take 25 mg by mouth every evening. ) 90 tablet 3 11/02/2019 at 1800  . Multiple Vitamin (MULTIVITAMIN) tablet Take 1 tablet by mouth daily.   11/02/2019 at Unknown time  . triamcinolone (NASACORT) 55 MCG/ACT nasal inhaler Place 2 sprays into the nose daily as needed.    10/31/2019  . valsartan (DIOVAN) 80 MG tablet Take 1 tablet (80 mg total) by mouth daily. (Patient taking differently: Take 80 mg by mouth every evening. ) 90 tablet 3 11/02/2019 at Unknown time  . hydrALAZINE (APRESOLINE) 25 MG tablet Take 25 mg twice a day if needed for systolic B/P 644 or greater 30 tablet 3 More than a month at Unknown time  . Levalbuterol HCl (XOPENEX IN) Inhale into the lungs as needed.   More than a month at Unknown time  . levocetirizine (XYZAL) 5 MG tablet Take 1 tablet (5 mg total) by mouth daily. Needs a follow up for further refills. [phone] (Patient  taking differently: Take 5 mg by mouth daily as needed. Needs a follow up for further refills. [phone]) 30 tablet 0 More than a month at Unknown time    Review of Systems  All other systems reviewed and are negative.   Blood pressure (!) 145/93, pulse 64, temperature 98.8 F (37.1 C), temperature source Oral, resp. rate 16, height 5\' 5"  (1.651 m), weight 74.2 kg, SpO2 100 %. Physical Exam Vitals reviewed.  Constitutional:      Appearance: Normal appearance.  Cardiovascular:     Rate and Rhythm: Regular rhythm.     Pulses: Normal pulses.     Heart sounds: Normal heart sounds.   Skin:    General: Skin is warm and dry.  Neurological:     General: No focal deficit present.     Mental Status: [name] alert.  Psychiatric:        Mood and Affect: Mood normal.     Results for orders placed or performed during the hospital encounter of 11/02/19 (from the past 24 hour(s))  SARS CORONAVIRUS 2 (TAT 6-24 HRS) Nasopharyngeal Nasopharyngeal Swab     Status: None   Collection Time: 11/02/19 10:05 AM   Specimen: Nasopharyngeal Swab  Result Value Ref Range   SARS Coronavirus 2 NEGATIVE NEGATIVE    No results found.  Assessment/Plan: 58 yo G5P3 MWF with h/o PMP bleeding, endometrial thickening and probable polyp here for hysteroscopy with polyp resection, D&C.  Questions answered.  Pt here and ready to proceed.  Megan Salon 11/03/2019, 9:11 AM

## 2019-11-04 ENCOUNTER — Telehealth: Payer: Self-pay | Admitting: Obstetrics & Gynecology

## 2019-11-04 ENCOUNTER — Other Ambulatory Visit: Payer: Self-pay | Admitting: Obstetrics & Gynecology

## 2019-11-04 MED ORDER — ALPRAZOLAM 0.5 MG PO TABS: 0.5000 mg | ORAL_TABLET | ORAL | 0 refills | Status: DC | PRN

## 2019-11-04 NOTE — Telephone Encounter (Signed)
Med refill request: Alprazolam 0.5 mg  Last AEX: Next AEX: Last MMG (if hormonal med) Refill authorized: Please Advise?  Spoke with pt. Pt states doing well after procedure yesterday. Pt had D&C hysteroscopy. States not having any pain or taking any pain meds. Pt only complaint is not sleeping well. Pt requesting refill on alprazolam. Pt states only taking 1/4 of tablet. Pt is currently out and would like for tonight. Pt states sees Dr Sabra Heck and discussed having refills by her since pt not seeing PCP as often.   Pt aware Dr Sabra Heck not in office today. Advised will review with Dr Sabra Heck in am for refill or with covering provider today. Pt agreeable.   Routing to Dr Sabra Heck

## 2019-11-04 NOTE — Telephone Encounter (Signed)
Patient is called regarding the prescription what was denied. She said "my PCP normally prescribes this but I have not seen my PCP in over a year. "I have seen Dr.Miller all year and had my labs with Dr.Miller. "I had a D&C this week and do not  like the medication I was prescribed.

## 2019-11-05 ENCOUNTER — Encounter (HOSPITAL_BASED_OUTPATIENT_CLINIC_OR_DEPARTMENT_OTHER): Payer: Self-pay | Admitting: Obstetrics & Gynecology

## 2019-11-06 ENCOUNTER — Telehealth: Payer: Self-pay | Admitting: Obstetrics & Gynecology

## 2019-11-06 LAB — SURGICAL PATHOLOGY

## 2019-11-06 NOTE — Telephone Encounter (Signed)
Spoke with pt. Pt calling to get results from [facility] on 11/03/19. Advised results not back yet, but will return call when resulted and have recommendations per Dr Sabra Heck. Pt agreeable.

## 2019-11-06 NOTE — Telephone Encounter (Signed)
Patient is calling regarding pathology results in [place]. Patient stated that she was told they would be back on Thursday, 11/05/2019. Patient got a notification that she had pathology results in [place], but when looking, she was unable to see any results.

## 2019-11-12 NOTE — Progress Notes (Signed)
Post Operative Visit  Procedure: D & C/hysteroscopy Days Post-op: 14  Subjective: Pt is two weeks from hysteroscopy with D&C due to PMP bleeding, thickened endometrium.  Images from procedure, pathology all reviewed.  No polyp was seen and only atrophic findings.  Pt has some minimal spotting for several days but none in last three and minimal cramping.  Pt did have very unusual bruise at bottom of left buttocks.  Has pictures.  Has experienced some left hip pain since.  Has seen chiropractor once.  Has appt tomorrow.  Is better but wants to know if she fell.  She did not.  She felt pain immediately in the recovery room when woke up.  I cannot explain bruise but it was definitely not there prior to procedure.  Will see if I can review nursing notes from procedure.  Anesthesia notes show nothing out of the ordinary.  Objective: BP 112/70   Pulse 70   Resp 16   Wt 163 lb (73.9 kg)   LMP 09/14/2019 (Exact Date)   BMI 27.12 kg/m   EXAM General: alert and no distress GI: soft, non-tender; bowel sounds normal; no masses,  no organomegaly  Gyn:  NAEFG, vagina without lesions or bleeding. Extremities: extremities normal, atraumatic, no cyanosis or edema Vaginal Bleeding: none  Assessment: s/p hysteroscopy with D&C due to PMP bleeding, thickened endometrium Atypical bruise on buttocks that has fully resolved but some lingering left hip pain  Plan: Will follow up with pt after discussing with supervisor of surgical center to review notes.

## 2019-11-13 NOTE — Telephone Encounter (Signed)
Dr Sabra Heck called and left detailed message for pt with path results on 11/12/19. Encounter closed.

## 2019-11-16 ENCOUNTER — Encounter: Payer: Self-pay | Admitting: Obstetrics & Gynecology

## 2019-11-16 ENCOUNTER — Other Ambulatory Visit: Payer: Self-pay

## 2019-11-16 ENCOUNTER — Ambulatory Visit (INDEPENDENT_AMBULATORY_CARE_PROVIDER_SITE_OTHER): Payer: Managed Care, Other (non HMO) | Admitting: Obstetrics & Gynecology

## 2019-11-16 VITALS — BP 112/70 | HR 70 | Resp 16 | Wt 163.0 lb

## 2019-11-16 DIAGNOSIS — R9389 Abnormal findings on diagnostic imaging of other specified body structures: Secondary | ICD-10-CM

## 2019-11-16 DIAGNOSIS — N950 Postmenopausal bleeding: Secondary | ICD-10-CM

## 2019-12-18 ENCOUNTER — Telehealth: Payer: Self-pay

## 2019-12-18 NOTE — Telephone Encounter (Signed)
Call placed to reschedule annual appointment scheduled for 01/29/20. This is a dr/cx/appt

## 2020-01-08 ENCOUNTER — Ambulatory Visit (INDEPENDENT_AMBULATORY_CARE_PROVIDER_SITE_OTHER): Payer: Managed Care, Other (non HMO)

## 2020-01-08 ENCOUNTER — Other Ambulatory Visit: Payer: Self-pay

## 2020-01-08 ENCOUNTER — Ambulatory Visit (INDEPENDENT_AMBULATORY_CARE_PROVIDER_SITE_OTHER): Payer: Managed Care, Other (non HMO) | Admitting: Podiatry

## 2020-01-08 DIAGNOSIS — M79671 Pain in right foot: Secondary | ICD-10-CM

## 2020-01-08 DIAGNOSIS — M722 Plantar fascial fibromatosis: Secondary | ICD-10-CM

## 2020-01-08 DIAGNOSIS — M779 Enthesopathy, unspecified: Secondary | ICD-10-CM

## 2020-01-08 NOTE — Patient Instructions (Signed)

## 2020-01-09 NOTE — Progress Notes (Signed)
Subjective: 59 year old female presents the office today for concerns of right heel pain.  She states that she has been trying to change shoes, inserts without significant resolution.  She actually was in [place] walking about 2 weeks ago showed a change shoes so she was given a round insert at the store.  She denies any recent injury or trauma.  She has occasional numbness of her foot particular she feels a sensation into second third toes.  When she was walking she was experiencing some discomfort lateral to the heel she reports as well.  No significant swelling and no radiating pain or weakness.  Objective: AAO x3, NAD DP/PT pulses palpable bilaterally, CRT less than 3 seconds Negative tinel sign Tenderness to palpation along the plantar medial tubercle of the calcaneus at the insertion of plantar fascia on the right foot. There is minimal pain along the course of the plantar fascia within the arch of the foot. Plantar fascia appears to be intact. There is no pain with lateral compression of the calcaneus or pain with vibratory sensation. There is no pain along the course or insertion of the achilles tendon.  Mild discomfort to the second third digits. No area discomfort of the dorsal foot.  No other areas of tenderness to bilateral lower extremities. No pain with calf compression, swelling, warmth, erythema  Assessment: Right foot heel pain, plantar fasciitis; capsulitis  Plan: -All treatment options discussed with the patient including all alternatives, risks, complications.  -X-rays obtained reviewed.  No subacute fracture identified today. -I demonstrated her symptoms coming from plantar fasciitis although she does have some numbness and pain between her second third toes of extremities as result of compensation.  Today a steroid injection was performed as well as a plantar fascial brace dispensed.  Continue stretching, icing daily.  She was to hold off on oral medications we discussed  Voltaren gel.  Procedure: Injection Tendon/Ligament Discussed alternatives, risks, complications and verbal consent was obtained.  Location: Right plantar fascia at the glabrous junction; medial approach. Skin Prep: Alcohol Injectate: 0.5cc 0.5% marcaine plain, 0.5 cc 2% lidocaine plain and, 1 cc kenalog 10. Disposition: Patient tolerated procedure well. Injection site dressed with a band-aid.  Post-injection care was discussed and return precautions discussed.   No follow-ups on file.  Trula Slade DPM

## 2020-01-29 ENCOUNTER — Ambulatory Visit: Payer: Managed Care, Other (non HMO) | Admitting: Obstetrics & Gynecology

## 2020-02-02 ENCOUNTER — Encounter: Payer: Self-pay | Admitting: Podiatry

## 2020-02-02 ENCOUNTER — Ambulatory Visit (INDEPENDENT_AMBULATORY_CARE_PROVIDER_SITE_OTHER): Payer: Managed Care, Other (non HMO) | Admitting: Podiatry

## 2020-02-02 ENCOUNTER — Other Ambulatory Visit: Payer: Self-pay

## 2020-02-02 DIAGNOSIS — M722 Plantar fascial fibromatosis: Secondary | ICD-10-CM | POA: Diagnosis not present

## 2020-02-02 DIAGNOSIS — M79671 Pain in right foot: Secondary | ICD-10-CM | POA: Diagnosis not present

## 2020-02-08 NOTE — Progress Notes (Signed)
Subjective: 59 year old female presents the office today for concerns of right foot heel pain, plantar fasciitis.  She said the injection was not helpful and if that she is try she is having pain in the heel.  Since the last time she did see a massage therapist and they work the calf and she had relief for 3 days.  She also has seen physical therapy once.  She has no other concerns today.  No recent falls or injury.  No rating pain or weakness.  Denies any systemic complaints such as fevers, chills, nausea, vomiting. No acute changes since last appointment, and no other complaints at this time.   Objective: AAO x3, NAD DP/PT pulses palpable bilaterally, CRT less than 3 seconds There is tenderness palpation swelling plantar medial tubercle of the calcaneus with insertion of plantar fasciitis the right foot.  Pain is still localized to the same area.  There is no pain with lateral compression of calcaneus.  There is no edema, erythema.  Flexor, extensor tendons appear to be intact.  MMT 5/5. No pain with calf compression, swelling, warmth, erythema  Assessment: Right foot plantar fasciitis  Plan: -All treatment options discussed with the patient including all alternatives, risks, complications.  -She still continues have symptoms.  Injections were not helpful.  She still using plantar fascial brace.  This time she is in a continue with physical therapy.  Also encourage massage therapy as well.  Continue with supportive shoes.  Also will order MRI to rule out partial tear of the plantar fascia which was ordered today. -Patient encouraged to call the office with any questions, concerns, change in symptoms.   Trula Slade DPM

## 2020-02-18 ENCOUNTER — Other Ambulatory Visit: Payer: Self-pay | Admitting: Podiatry

## 2020-02-23 ENCOUNTER — Other Ambulatory Visit: Payer: Self-pay

## 2020-02-23 ENCOUNTER — Ambulatory Visit
Admission: RE | Admit: 2020-02-23 | Discharge: 2020-02-23 | Disposition: A | Discharge status: Home / Self Care | Payer: Managed Care, Other (non HMO) | Source: Ambulatory Visit | Attending: Podiatry | Admitting: Podiatry

## 2020-02-23 DIAGNOSIS — M722 Plantar fascial fibromatosis: Secondary | ICD-10-CM

## 2020-03-04 ENCOUNTER — Telehealth: Payer: Self-pay | Admitting: *Deleted

## 2020-03-04 MED ORDER — LEVALBUTEROL TARTRATE 45 MCG/ACT IN AERO: 1.0000 | INHALATION_SPRAY | Freq: Four times a day (QID) | RESPIRATORY_TRACT | 1 refills | Status: DC | PRN

## 2020-03-04 NOTE — Telephone Encounter (Signed)
May refill once. 

## 2020-03-04 NOTE — Telephone Encounter (Signed)
[facility]ed a request for a new Rx for Lealbuterol Tar HFA 44mcg inh and message sent to PCP.

## 2020-03-31 ENCOUNTER — Other Ambulatory Visit: Payer: Self-pay | Admitting: Podiatry

## 2020-03-31 DIAGNOSIS — M722 Plantar fascial fibromatosis: Secondary | ICD-10-CM

## 2020-04-26 ENCOUNTER — Telehealth: Payer: Self-pay | Admitting: Cardiology

## 2020-04-26 DIAGNOSIS — I10 Essential (primary) hypertension: Secondary | ICD-10-CM

## 2020-04-26 MED ORDER — VALSARTAN 80 MG PO TABS: 80.0000 mg | ORAL_TABLET | Freq: Every day | ORAL | 3 refills | Status: DC

## 2020-04-26 NOTE — Telephone Encounter (Signed)
*  STAT* If patient is at the pharmacy, call can be transferred to refill team.   1. Which medications need to be refilled? (please list name of each medication and dose if known)  Valsartan  2. Which pharmacy/location (including street and city if local pharmacy) is medication to be sent to? [facility] [place], [place]  3. Do they need a 30 day or 90 day supply? 90 days- she have an appt 07-13-20 with Dr Stanford Breed

## 2020-04-28 ENCOUNTER — Other Ambulatory Visit: Payer: Self-pay

## 2020-04-29 MED ORDER — ALPRAZOLAM 0.5 MG PO TABS: 0.5000 mg | ORAL_TABLET | ORAL | 0 refills | Status: DC | PRN

## 2020-05-27 ENCOUNTER — Other Ambulatory Visit: Payer: Self-pay | Admitting: Cardiology

## 2020-07-05 NOTE — Progress Notes (Signed)
HPI: FU palpitations. Echo 2/19 showed normal LV function. Event monitor 2/19 showed sinus with PVCs.Exercise treadmill December 2019 showed a hypertensive response, no significant arrhythmias and no diagnostic ST changes. Since last seen, denies dyspnea, chest pain or syncope.  Her palpitations have improved.  Current Outpatient Medications  Medication Sig Dispense Refill  . ALPRAZolam (XANAX) 0.5 MG tablet Take 1 tablet (0.5 mg total) by mouth as needed for anxiety. 20 tablet 0  . cetirizine (ZYRTEC) 10 MG tablet Take 10 mg by mouth daily.    . hydrALAZINE (APRESOLINE) 25 MG tablet Take 25 mg twice a day if needed for systolic B/P 759 or greater 30 tablet 3  . ibuprofen (ADVIL,MOTRIN) 200 MG tablet Take 200 mg by mouth every 6 (six) hours as needed for pain.     Marland Kitchen levalbuterol (XOPENEX HFA) 45 MCG/ACT inhaler Inhale 1-2 puffs into the lungs every 6 (six) hours as needed for wheezing. 1 each 1  . metoprolol succinate (TOPROL-XL) 25 MG 24 hr tablet TAKE 1 TABLET(25 MG) BY MOUTH DAILY 90 tablet 1  . Multiple Vitamin (MULTIVITAMIN) tablet Take 1 tablet by mouth daily.    Marland Kitchen triamcinolone (NASACORT) 55 MCG/ACT nasal inhaler Place 2 sprays into the nose daily as needed.     . valsartan (DIOVAN) 80 MG tablet Take 1 tablet (80 mg total) by mouth daily. 90 tablet 3   No current facility-administered medications for this visit.     Past Medical History:  Diagnosis Date  . Arthritis    thumbs  . Endometrial mass   . Endometrial polyp   . Hypertension    followed by pcp and cardiology   . Neck pain, chronic   . Palpitations    cardiologist-- dr Stanford Breed---  ETT 03-06-2018 in epic normal with no significant arrythmia;  echo 05-16-2017 normal ;  event monitor 05-16-2017 SR with PVCs  . PMB (postmenopausal bleeding)   . Seasonal allergies   . Seasonal asthma    followd by pcp    Past Surgical History:  Procedure Laterality Date  . APPENDECTOMY  1996  . DILATATION &  CURETTAGE/HYSTEROSCOPY WITH MYOSURE N/A 11/03/2019   Procedure: DILATATION & CURETTAGE/HYSTEROSCOPY;  Surgeon: Megan Salon, MD;  Location: [facility];  Service: Gynecology;  Laterality: N/A;  . DILATION AND CURETTAGE OF UTERUS  2003   retained placenta post SVD  . PLACEMENT OF BREAST IMPLANTS Bilateral 1998 approx.  . TONSILLECTOMY AND ADENOIDECTOMY  child    Social History   Socioeconomic History  . Marital status: Married    Spouse name: Not on file  . Number of children: 3  . Years of education: Not on file  . Highest education level: Not on file  Occupational History    Comment: Realtor  Tobacco Use  . Smoking status: Never Smoker  . Smokeless tobacco: Never Used  Vaping Use  . Vaping Use: Never used  Substance and Sexual Activity  . Alcohol use: Yes    Alcohol/week: 7.0 - 14.0 standard drinks    Types: 7 - 14 Glasses of wine per week  . Drug use: Never  . Sexual activity: Yes    Birth control/protection: Post-menopausal    Comment: vasectomy   Other Topics Concern  . Not on file  Social History Narrative  . Not on file   Social Determinants of Health   Financial Resource Strain: Not on file  Food Insecurity: Not on file  Transportation Needs: Not on file  Physical Activity: Not on file  Stress: Not on file  Social Connections: Not on file  Intimate Partner Violence: Not on file    Family History  Problem Relation Age of Onset  . Hypertension Mother   . Stroke Mother   . Hypertension Father   . Dementia Father     ROS: no fevers or chills, productive cough, hemoptysis, dysphasia, odynophagia, melena, hematochezia, dysuria, hematuria, rash, seizure activity, orthopnea, PND, pedal edema, claudication. Remaining systems are negative.  Physical Exam: Well-developed well-nourished in no acute distress.  Skin is warm and dry.  HEENT is normal.  Neck is supple.  Chest is clear to auscultation with normal expansion.  Cardiovascular exam is  regular rate and rhythm.  Abdominal exam nontender or distended. No masses palpated. Extremities show no edema. neuro grossly intact  ECG-sinus rhythm at a rate of 57, no ST changes.  Personally reviewed  A/P  1 hypertension-blood pressure controlled.  Continue present medical regimen.  2 palpitations-symptoms are controlled.  We will continue beta-blocker at present dose.  Kirk Ruths, MD

## 2020-07-13 ENCOUNTER — Encounter: Payer: Self-pay | Admitting: Cardiology

## 2020-07-13 ENCOUNTER — Ambulatory Visit: Payer: Managed Care, Other (non HMO) | Admitting: Cardiology

## 2020-07-13 ENCOUNTER — Other Ambulatory Visit: Payer: Self-pay

## 2020-07-13 VITALS — BP 124/72 | HR 62 | Ht 65.5 in | Wt 162.4 lb

## 2020-07-13 DIAGNOSIS — I10 Essential (primary) hypertension: Secondary | ICD-10-CM

## 2020-07-13 DIAGNOSIS — R002 Palpitations: Secondary | ICD-10-CM

## 2020-07-13 NOTE — Patient Instructions (Signed)

## 2020-09-22 ENCOUNTER — Other Ambulatory Visit (HOSPITAL_COMMUNITY)
Admission: RE | Admit: 2020-09-22 | Discharge: 2020-09-22 | Disposition: A | Discharge status: Home / Self Care | Payer: 59 | Source: Ambulatory Visit | Attending: Obstetrics & Gynecology | Admitting: Obstetrics & Gynecology

## 2020-09-22 ENCOUNTER — Encounter (HOSPITAL_BASED_OUTPATIENT_CLINIC_OR_DEPARTMENT_OTHER): Payer: Self-pay | Admitting: Obstetrics & Gynecology

## 2020-09-22 ENCOUNTER — Other Ambulatory Visit: Payer: Self-pay

## 2020-09-22 ENCOUNTER — Ambulatory Visit (INDEPENDENT_AMBULATORY_CARE_PROVIDER_SITE_OTHER): Payer: 59 | Admitting: Obstetrics & Gynecology

## 2020-09-22 VITALS — BP 144/82 | HR 58 | Ht 64.75 in | Wt 164.0 lb

## 2020-09-22 DIAGNOSIS — N950 Postmenopausal bleeding: Secondary | ICD-10-CM | POA: Diagnosis not present

## 2020-09-22 DIAGNOSIS — Z124 Encounter for screening for malignant neoplasm of cervix: Secondary | ICD-10-CM

## 2020-09-22 DIAGNOSIS — Z01419 Encounter for gynecological examination (general) (routine) without abnormal findings: Secondary | ICD-10-CM

## 2020-09-22 DIAGNOSIS — Z1211 Encounter for screening for malignant neoplasm of colon: Secondary | ICD-10-CM

## 2020-09-22 DIAGNOSIS — J4522 Mild intermittent asthma with status asthmaticus: Secondary | ICD-10-CM

## 2020-09-22 DIAGNOSIS — F5104 Psychophysiologic insomnia: Secondary | ICD-10-CM

## 2020-09-22 DIAGNOSIS — I10 Essential (primary) hypertension: Secondary | ICD-10-CM | POA: Diagnosis not present

## 2020-09-22 MED ORDER — ALPRAZOLAM 0.5 MG PO TABS: 0.5000 mg | ORAL_TABLET | ORAL | 0 refills | Status: DC | PRN

## 2020-09-22 NOTE — Progress Notes (Signed)
60 y.o. [id number] Married White or Caucasian female here for annual exam.  Doing well.  Denies vaginal bleeding.  Had hysteroscopy with D&C 10/2019.    Had some allergy issues last year.  This has all resolved.    Does take 1/3 xanax prn when has insomnia.  Patient's last menstrual period was 09/14/2019 (exact date).          Sexually active: Yes.    The current method of family planning is post menopausal status.    Exercising: Yes.    Smoker:  yes  Health Maintenance: Pap:  11/27/2018 Negative with neg HR HPV History of abnormal Pap:  no MMG:  06/06/2020 Negative Colonoscopy:  cologuard 2018 BMD:   about 13 years TDaP:  2014 Pneumonia vaccine(s):  not indicated Shingrix:    discussed again this year Hep C testing: 07/2016 Screening Labs: will plan with Dr. Elease Hashimoto   reports that she has never smoked. She has never used smokeless tobacco. She reports current alcohol use of about 7.0 - 14.0 standard drinks of alcohol per week. She reports that she does not use drugs.  Past Medical History:  Diagnosis Date   Arthritis    thumbs   Endometrial mass    Endometrial polyp    Hypertension    followed by pcp and cardiology    Neck pain, chronic    Palpitations    cardiologist-- dr Stanford Breed---  ETT 03-06-2018 in epic normal with no significant arrythmia;  echo 05-16-2017 normal ;  event monitor 05-16-2017 SR with PVCs   PMB (postmenopausal bleeding)    Seasonal allergies    Seasonal asthma    followd by pcp    Past Surgical History:  Procedure Laterality Date   [place] N/A 11/03/2019   Procedure: DILATATION & CURETTAGE/HYSTEROSCOPY;  Surgeon: Megan Salon, MD;  Location: [place];  Service: Gynecology;  Laterality: N/A;   DILATION AND CURETTAGE OF UTERUS  2003   retained placenta post SVD   PLACEMENT OF BREAST IMPLANTS Bilateral 1998 approx.   TONSILLECTOMY AND ADENOIDECTOMY  child    Current  Outpatient Medications  Medication Sig Dispense Refill   cetirizine (ZYRTEC) 10 MG tablet Take 10 mg by mouth daily.     ibuprofen (ADVIL,MOTRIN) 200 MG tablet Take 200 mg by mouth every 6 (six) hours as needed for pain.      levalbuterol (XOPENEX HFA) 45 MCG/ACT inhaler Inhale 1-2 puffs into the lungs every 6 (six) hours as needed for wheezing. 1 each 1   metoprolol succinate (TOPROL-XL) 25 MG 24 hr tablet TAKE 1 TABLET(25 MG) BY MOUTH DAILY 90 tablet 1   Multiple Vitamin (MULTIVITAMIN) tablet Take 1 tablet by mouth daily.     triamcinolone (NASACORT) 55 MCG/ACT nasal inhaler Place 2 sprays into the nose daily as needed.      valsartan (DIOVAN) 80 MG tablet Take 1 tablet (80 mg total) by mouth daily. 90 tablet 3   ALPRAZolam (XANAX) 0.5 MG tablet Take 1 tablet (0.5 mg total) by mouth as needed for anxiety. 30 tablet 0   hydrALAZINE (APRESOLINE) 25 MG tablet Take 25 mg twice a day if needed for systolic B/P 174 or greater (Patient not taking: Reported on 09/22/2020) 30 tablet 3   No current facility-administered medications for this visit.    Family History  Problem Relation Age of Onset   Hypertension Mother    Stroke Mother    Hypertension Father  Dementia Father     Review of Systems  All other systems reviewed and are negative.  Exam:   BP (!) 144/82   Pulse (!) 58   Ht 5' 4.75" (1.645 m)   Wt 164 lb (74.4 kg)   LMP 09/14/2019 (Exact Date)   BMI 27.50 kg/m   Height: 5' 4.75" (164.5 cm)  General appearance: alert, cooperative and appears stated age Head: Normocephalic, without obvious abnormality, atraumatic Neck: no adenopathy, supple, symmetrical, trachea midline and thyroid normal to inspection and palpation Lungs: clear to auscultation bilaterally Breasts: normal appearance, no masses or tenderness Heart: regular rate and rhythm Abdomen: soft, non-tender; bowel sounds normal; no masses,  no organomegaly Extremities: extremities normal, atraumatic, no cyanosis or  edema Skin: Skin color, texture, turgor normal. No rashes or lesions Lymph nodes: Cervical, supraclavicular, and axillary nodes normal. No abnormal inguinal nodes palpated Neurologic: Grossly normal   Pelvic: External genitalia:  no lesions              Urethra:  normal appearing urethra with no masses, tenderness or lesions              Bartholins and Skenes: normal                 Vagina: normal appearing vagina with normal color and no discharge, no lesions              Cervix: no lesions              Pap taken: No. Bimanual Exam:  Uterus:  normal size, contour, position, consistency, mobility, non-tender              Adnexa: normal adnexa and no mass, fullness, tenderness               Rectovaginal: Confirms               Anus:  normal sphincter tone, no lesions  Chaperone, Octaviano Batty, CMA, was present for exam.  Assessment/Plan: 1. Well woman exam with routine gynecological exam - pap only 2020.  Pap and HR HPV obtained today. - cologuard will be ordered - plan BMD around age 70 - she will plan blood work with Dr. Elease Hashimoto - vaccines reviewed  2. Postmenopausal bleeding - no HRT  3. Mild intermittent asthma with status asthmaticus  4. Primary hypertension  5. Colon cancer screening - Cologuard  6.  Chronic insomnia - uses xanax 0.25mg  1/3 tab prn as needed.  Typically only needs one RX a year

## 2020-09-23 ENCOUNTER — Ambulatory Visit (HOSPITAL_BASED_OUTPATIENT_CLINIC_OR_DEPARTMENT_OTHER): Payer: Managed Care, Other (non HMO) | Admitting: Obstetrics & Gynecology

## 2020-09-29 LAB — CYTOLOGY - PAP
Comment: NEGATIVE
Diagnosis: NEGATIVE
High risk HPV: NEGATIVE

## 2020-11-18 LAB — COLOGUARD: Cologuard: NEGATIVE

## 2020-11-29 ENCOUNTER — Other Ambulatory Visit: Payer: Self-pay | Admitting: Cardiology

## 2021-04-25 ENCOUNTER — Other Ambulatory Visit: Payer: Self-pay | Admitting: Cardiology

## 2021-04-25 ENCOUNTER — Telehealth: Payer: Self-pay | Admitting: Cardiology

## 2021-04-25 DIAGNOSIS — I10 Essential (primary) hypertension: Secondary | ICD-10-CM

## 2021-04-25 NOTE — Telephone Encounter (Signed)
Spoke with pt, Refill sent to the pharmacy electronically.  

## 2021-04-25 NOTE — Telephone Encounter (Signed)
°*  STAT* If patient is at the pharmacy, call can be transferred to refill team.   1. Which medications need to be refilled? (please list name of each medication and dose if known) valsartan (DIOVAN) 80 MG tablet  2. Which pharmacy/location (including street and city if local pharmacy) is medication to be sent to? [id number] [place], [place] - [address]  3. Do they need a 30 day or 90 day supply? [place]

## 2021-06-14 ENCOUNTER — Encounter (HOSPITAL_BASED_OUTPATIENT_CLINIC_OR_DEPARTMENT_OTHER): Payer: Self-pay | Admitting: *Deleted

## 2021-06-22 ENCOUNTER — Encounter (HOSPITAL_BASED_OUTPATIENT_CLINIC_OR_DEPARTMENT_OTHER): Payer: Self-pay | Admitting: *Deleted

## 2021-07-03 ENCOUNTER — Telehealth: Payer: Self-pay | Admitting: Cardiology

## 2021-07-03 NOTE — Telephone Encounter (Signed)
Spoke to patient she stated she was on vacation last week.Stated she noticed when she got back home her B/P was elevated.Readings listed below.Stated she was dizzy and had a headache. B/P at present 137/88.Appointment scheduled with Diona Browner NP 4/20 at 2:15 pm.Advised to bring a list of B/P readings and all medications. ?

## 2021-07-03 NOTE — Telephone Encounter (Signed)
Pt c/o BP issue: STAT if pt c/o blurred vision, one-sided weakness or slurred speech ? ?1. What are your last 5 BP readings? 165/97 yesterday, this morning 145/102 ? ?2. Are you having any other symptoms (ex. Dizziness, headache, blurred vision, passed out)? Dizziness and headaches ? ?3. What is your BP issue? Patient states her BP has been high. She says it had not spiked in 3 years. She says she took her hydralazine and also took some xanax. She says this always seems to happen when her medications are changed. She says her pills used to be ovals and now they are round. She states she is not sure if this is the manufacturer.  ?

## 2021-07-06 ENCOUNTER — Ambulatory Visit: Payer: Self-pay | Admitting: Nurse Practitioner

## 2021-07-06 NOTE — Progress Notes (Deleted)
Office Visit    Patient Name: Lori Love Date of Encounter: 07/06/2021  Primary Care Provider:  Eulas Post, MD Primary Cardiologist:  None  Chief Complaint    61 year old female with a history of palpitations, PVCs, hypertension, seasonal allergies, and arthritis who presents for follow-up related to hypertension.  Past Medical History    Past Medical History:  Diagnosis Date   Arthritis    thumbs   Endometrial mass    Endometrial polyp    Hypertension    followed by pcp and cardiology    Neck pain, chronic    Palpitations    cardiologist-- dr Stanford Breed---  ETT 03-06-2018 in epic normal with no significant arrythmia;  echo 05-16-2017 normal ;  event monitor 05-16-2017 SR with PVCs   PMB (postmenopausal bleeding)    Seasonal allergies    Seasonal asthma    followd by pcp   Past Surgical History:  Procedure Laterality Date   [place] N/A 11/03/2019   Procedure: DILATATION & CURETTAGE/HYSTEROSCOPY;  Surgeon: Megan Salon, MD;  Location: [place];  Service: Gynecology;  Laterality: N/A;   DILATION AND CURETTAGE OF UTERUS  2003   retained placenta post SVD   PLACEMENT OF BREAST IMPLANTS Bilateral 1998 approx.   TONSILLECTOMY AND ADENOIDECTOMY  child    Allergies  Allergies  Allergen Reactions   Penicillins Hives   Sulfa Antibiotics Rash    History of Present Illness    61 year old female with the above past medical history including palpitations, PVCs, hypertension, seasonal allergies, and arthritis.  Echocardiogram in 2019 showed 60 to 65%, normal LV function.  Outpatient cardiac monitor in the setting of palpitations showed sinus rhythm with PVCs.  ETT in December 2019 showed a hypertensive response, no significant arrhythmias, no diagnostic ST changes.  He was last seen in the office on 07/13/2020 was stable from a cardiac standpoint.  He was well controlled.  Palpitations  were also well controlled on beta-blocker.  She contacted our office on 07/03/2021 with concern for elevated BP, dizziness, and headache.  She presents today for follow-up.  Since she called our office and since her last visit  Hypertension: Dizziness/headache: Palpitations/PVCs: Disposition:  Home Medications    Current Outpatient Medications  Medication Sig Dispense Refill   ALPRAZolam (XANAX) 0.5 MG tablet Take 1 tablet (0.5 mg total) by mouth as needed for anxiety. 30 tablet 0   cetirizine (ZYRTEC) 10 MG tablet Take 10 mg by mouth daily.     hydrALAZINE (APRESOLINE) 25 MG tablet Take 25 mg twice a day if needed for systolic B/P 161 or greater (Patient not taking: Reported on 09/22/2020) 30 tablet 3   ibuprofen (ADVIL,MOTRIN) 200 MG tablet Take 200 mg by mouth every 6 (six) hours as needed for pain.      levalbuterol (XOPENEX HFA) 45 MCG/ACT inhaler Inhale 1-2 puffs into the lungs every 6 (six) hours as needed for wheezing. 1 each 1   metoprolol succinate (TOPROL-XL) 25 MG 24 hr tablet TAKE 1 TABLET(25 MG) BY MOUTH DAILY 90 tablet 3   Multiple Vitamin (MULTIVITAMIN) tablet Take 1 tablet by mouth daily.     triamcinolone (NASACORT) 55 MCG/ACT nasal inhaler Place 2 sprays into the nose daily as needed.      valsartan (DIOVAN) 80 MG tablet TAKE 1 TABLET BY MOUTH DAILY 90 tablet 3   No current facility-administered medications for this visit.     Review of Systems    ***.  All other systems reviewed and are otherwise negative except as noted above.    Physical Exam    VS:  LMP 09/14/2019 (Exact Date)  , BMI There is no height or weight on file to calculate BMI.     GEN: Well nourished, well developed, in no acute distress. HEENT: normal. Neck: Supple, no JVD, carotid bruits, or masses. Cardiac: RRR, no murmurs, rubs, or gallops. No clubbing, cyanosis, edema.  Radials/DP/PT 2+ and equal bilaterally.  Respiratory:  Respirations regular and unlabored, clear to auscultation  bilaterally. GI: Soft, nontender, nondistended, BS + x 4. MS: no deformity or atrophy. Skin: warm and dry, no rash. Neuro:  Strength and sensation are intact. Psych: Normal affect.  Accessory Clinical Findings    ECG personally reviewed by me today - *** - no acute changes.  Lab Results  Component Value Date   WBC 6.4 10/30/2019   HGB 12.9 10/30/2019   HCT 41.0 10/30/2019   MCV 99.3 10/30/2019   PLT 293 10/30/2019   Lab Results  Component Value Date   CREATININE 0.87 10/30/2019   BUN 16 10/30/2019   NA 139 10/30/2019   K 4.8 10/30/2019   CL 104 10/30/2019   CO2 26 10/30/2019   Lab Results  Component Value Date   ALT 20 11/25/2018   AST 26 11/25/2018   ALKPHOS 83 11/25/2018   BILITOT 0.3 11/25/2018   Lab Results  Component Value Date   CHOL 238 (H) 12/02/2018   HDL 100 12/02/2018   LDLCALC 123 (H) 12/02/2018   LDLDIRECT 100.8 03/27/2013   TRIG 89 12/02/2018   CHOLHDL 2.4 12/02/2018    No results found for: HGBA1C  Assessment & Plan    1.  ***   Lenna Sciara, NP 07/06/2021, 7:26 AM

## 2021-07-07 ENCOUNTER — Telehealth: Payer: Self-pay | Admitting: Licensed Clinical Social Worker

## 2021-07-07 NOTE — Telephone Encounter (Signed)
Pt returned my call. I introduced self, role, reason for call. ?Pt confirmed she has coverage under [employer] at this time (previously was covered by [facility]). No additional questions/concerns at this time.  ? ?Westley Hummer, MSW, LCSW ?Clinical Social Worker II ?[facility]  ?[phone]- work cell phone (preferred) ?[phone]- desk phone ? ?

## 2021-07-07 NOTE — Telephone Encounter (Addendum)
LCSW noted appt rescheduled for May from 07/06/21, no coverage listed currently for pt. I attempted to reach her at [phone]. No answer, voicemail left requesting return call. If no insurance, may be eligible for assistance programs.  ? ?Westley Hummer, MSW, LCSW ?Clinical Social Worker II ?[facility]  ?[phone]- work cell phone (preferred) ?[phone]- desk phone ? ?

## 2021-07-13 NOTE — Progress Notes (Signed)
? ? ? ? ?HPI:FU palpitations. Echo 2/19 showed normal LV function. Event monitor 2/19 showed sinus with PVCs.  Exercise treadmill December 2019 showed a hypertensive response, no significant arrhythmias and no diagnostic ST changes. Since last seen, there is no dyspnea, chest pain, palpitations or syncope. ? ?Current Outpatient Medications  ?Medication Sig Dispense Refill  ? ALPRAZolam (XANAX) 0.5 MG tablet Take 1 tablet (0.5 mg total) by mouth as needed for anxiety. 30 tablet 0  ? cetirizine (ZYRTEC) 10 MG tablet Take 10 mg by mouth daily.    ? hydrALAZINE (APRESOLINE) 25 MG tablet Take 25 mg twice a day if needed for systolic B/P 536 or greater (Patient not taking: Reported on 09/22/2020) 30 tablet 3  ? ibuprofen (ADVIL,MOTRIN) 200 MG tablet Take 200 mg by mouth every 6 (six) hours as needed for pain.     ? levalbuterol (XOPENEX HFA) 45 MCG/ACT inhaler Inhale 1-2 puffs into the lungs every 6 (six) hours as needed for wheezing. 1 each 1  ? metoprolol succinate (TOPROL-XL) 25 MG 24 hr tablet TAKE 1 TABLET(25 MG) BY MOUTH DAILY 90 tablet 3  ? Multiple Vitamin (MULTIVITAMIN) tablet Take 1 tablet by mouth daily.    ? triamcinolone (NASACORT) 55 MCG/ACT nasal inhaler Place 2 sprays into the nose daily as needed.     ? valsartan (DIOVAN) 80 MG tablet TAKE 1 TABLET BY MOUTH DAILY 90 tablet 3  ? ?No current facility-administered medications for this visit.  ? ? ? ?Past Medical History:  ?Diagnosis Date  ? Arthritis   ? thumbs  ? Endometrial mass   ? Endometrial polyp   ? Hypertension   ? followed by pcp and cardiology   ? Neck pain, chronic   ? Palpitations   ? cardiologist-- dr Stanford Breed---  ETT 03-06-2018 in epic normal with no significant arrythmia;  echo 05-16-2017 normal ;  event monitor 05-16-2017 SR with PVCs  ? PMB (postmenopausal bleeding)   ? Seasonal allergies   ? Seasonal asthma   ? followd by pcp  ? ? ?Past Surgical History:  ?Procedure Laterality Date  ? APPENDECTOMY  1996  ? DILATATION & CURETTAGE/HYSTEROSCOPY  WITH MYOSURE N/A 11/03/2019  ? Procedure: DILATATION & CURETTAGE/HYSTEROSCOPY;  Surgeon: Megan Salon, MD;  Location[facility];  Service: Gynecology;  Laterality: N/A;  ? [place] OF UTERUS  2003  ? retained placenta post SVD  ? PLACEMENT OF BREAST IMPLANTS Bilateral 1998 approx.  ? TONSILLECTOMY AND ADENOIDECTOMY  child  ? ? ?Social History  ? ?Socioeconomic History  ? Marital status: Married  ?  Spouse name: Not on file  ? Number of children: 3  ? Years of education: Not on file  ? Highest education level: Not on file  ?Occupational History  ?  Comment: Realtor  ?Tobacco Use  ? Smoking status: Never  ? Smokeless tobacco: Never  ?Vaping Use  ? Vaping Use: Never used  ?Substance and Sexual Activity  ? Alcohol use: Yes  ?  Alcohol/week: 7.0 - 14.0 standard drinks  ?  Types: 7 - 14 Glasses of wine per week  ? Drug use: Never  ? Sexual activity: Yes  ?  Birth control/protection: Post-menopausal  ?  Comment: vasectomy   ?Other Topics Concern  ? Not on file  ?Social History Narrative  ? Not on file  ? ?Social Determinants of Health  ? ?Financial Resource Strain: Not on file  ?Food Insecurity: Not on file  ?Transportation Needs: Not on file  ?  Physical Activity: Not on file  ?Stress: Not on file  ?Social Connections: Not on file  ?Intimate Partner Violence: Not on file  ? ? ?Family History  ?Problem Relation Age of Onset  ? Hypertension Mother   ? Stroke Mother   ? Hypertension Father   ? Dementia Father   ? ? ?ROS: no fevers or chills, productive cough, hemoptysis, dysphasia, odynophagia, melena, hematochezia, dysuria, hematuria, rash, seizure activity, orthopnea, PND, pedal edema, claudication. Remaining systems are negative. ? ?Physical Exam: ?Well-developed well-nourished in no acute distress.  ?Skin is warm and dry.  ?HEENT is normal.  ?Neck is supple.  ?Chest is clear to auscultation with normal expansion.  ?Cardiovascular exam is regular rate and rhythm.  ?Abdominal exam nontender or  distended. No masses palpated. ?Extremities show no edema. ?neuro grossly intact ? ?ECG-normal sinus rhythm at a rate of 61, no ST changes.  Personally reviewed ? ?A/P ? ?1.  Palpitations-symptoms are controlled.  We will continue beta-blocker. ? ?2 hypertension-patient's blood pressure is controlled.  Continue present medications and follow-up.  Check potassium and renal function.  Would also check lipids and liver. ? ?Kirk Ruths, MD ? ? ? ?

## 2021-07-26 ENCOUNTER — Encounter: Payer: Self-pay | Admitting: Cardiology

## 2021-07-26 ENCOUNTER — Ambulatory Visit: Payer: Commercial Managed Care - HMO | Admitting: Cardiology

## 2021-07-26 VITALS — BP 134/76 | HR 61 | Ht 65.0 in | Wt 155.0 lb

## 2021-07-26 DIAGNOSIS — E785 Hyperlipidemia, unspecified: Secondary | ICD-10-CM

## 2021-07-26 DIAGNOSIS — I10 Essential (primary) hypertension: Secondary | ICD-10-CM | POA: Diagnosis not present

## 2021-07-26 DIAGNOSIS — R002 Palpitations: Secondary | ICD-10-CM

## 2021-07-26 NOTE — Patient Instructions (Signed)

## 2021-07-27 ENCOUNTER — Other Ambulatory Visit: Payer: Self-pay | Admitting: *Deleted

## 2021-07-27 DIAGNOSIS — E785 Hyperlipidemia, unspecified: Secondary | ICD-10-CM

## 2021-07-27 LAB — COMPREHENSIVE METABOLIC PANEL
ALT: 17 IU/L (ref 0–32)
AST: 23 IU/L (ref 0–40)
Albumin/Globulin Ratio: 2 (ref 1.2–2.2)
Albumin: 4.9 g/dL (ref 3.8–4.9)
Alkaline Phosphatase: 92 IU/L (ref 44–121)
BUN/Creatinine Ratio: 16 (ref 9–23)
BUN: 16 mg/dL (ref 6–24)
Bilirubin Total: 0.5 mg/dL (ref 0.0–1.2)
CO2: 22 mmol/L (ref 20–29)
Calcium: 10 mg/dL (ref 8.7–10.2)
Chloride: 104 mmol/L (ref 96–106)
Creatinine, Ser: 0.98 mg/dL (ref 0.57–1.00)
Globulin, Total: 2.5 g/dL (ref 1.5–4.5)
Glucose: 82 mg/dL (ref 70–99)
Potassium: 5.2 mmol/L (ref 3.5–5.2)
Sodium: 141 mmol/L (ref 134–144)
Total Protein: 7.4 g/dL (ref 6.0–8.5)
eGFR: 67 mL/min/{1.73_m2} (ref >59)

## 2021-07-27 LAB — LIPID PANEL
Chol/HDL Ratio: 2.5 ratio (ref 0.0–4.4)
Cholesterol, Total: 266 mg/dL — ABNORMAL HIGH (ref 100–199)
HDL: 107 mg/dL (ref >39)
LDL Chol Calc (NIH): 149 mg/dL — ABNORMAL HIGH (ref 0–99)
Triglycerides: 66 mg/dL (ref 0–149)
VLDL Cholesterol Cal: 10 mg/dL (ref 5–40)

## 2021-07-28 ENCOUNTER — Telehealth (HOSPITAL_BASED_OUTPATIENT_CLINIC_OR_DEPARTMENT_OTHER): Payer: Self-pay | Admitting: Cardiology

## 2021-07-28 NOTE — Telephone Encounter (Signed)
Called patient to advise of Friday 08/04/21 11:30 am  CT Calcium Score appointment at [place].  Patient is requesting to have a Vitamin D level added to her lab work orders. ?

## 2021-07-31 NOTE — Telephone Encounter (Signed)
Spoke with pt, aware she will need vit d checked with her medical doctor. ?

## 2021-08-04 ENCOUNTER — Ambulatory Visit (HOSPITAL_BASED_OUTPATIENT_CLINIC_OR_DEPARTMENT_OTHER)
Admission: RE | Admit: 2021-08-04 | Discharge: 2021-08-04 | Disposition: A | Discharge status: Home / Self Care | Payer: Commercial Managed Care - HMO | Source: Ambulatory Visit | Attending: Cardiology | Admitting: Cardiology

## 2021-08-04 DIAGNOSIS — E785 Hyperlipidemia, unspecified: Secondary | ICD-10-CM | POA: Insufficient documentation

## 2021-08-17 ENCOUNTER — Other Ambulatory Visit (HOSPITAL_BASED_OUTPATIENT_CLINIC_OR_DEPARTMENT_OTHER): Payer: Self-pay | Admitting: Obstetrics & Gynecology

## 2021-08-18 ENCOUNTER — Other Ambulatory Visit (HOSPITAL_BASED_OUTPATIENT_CLINIC_OR_DEPARTMENT_OTHER): Payer: Self-pay | Admitting: Obstetrics & Gynecology

## 2021-08-18 MED ORDER — ALPRAZOLAM 0.5 MG PO TABS: 0.5000 mg | ORAL_TABLET | ORAL | 0 refills | Status: DC | PRN

## 2021-08-20 ENCOUNTER — Other Ambulatory Visit (HOSPITAL_BASED_OUTPATIENT_CLINIC_OR_DEPARTMENT_OTHER): Payer: Self-pay | Admitting: Cardiology

## 2021-10-12 ENCOUNTER — Ambulatory Visit (INDEPENDENT_AMBULATORY_CARE_PROVIDER_SITE_OTHER): Payer: Commercial Managed Care - HMO | Admitting: Obstetrics & Gynecology

## 2021-10-12 ENCOUNTER — Encounter (HOSPITAL_BASED_OUTPATIENT_CLINIC_OR_DEPARTMENT_OTHER): Payer: Self-pay | Admitting: Obstetrics & Gynecology

## 2021-10-12 VITALS — BP 134/72 | HR 60 | Ht 65.0 in | Wt 154.8 lb

## 2021-10-12 DIAGNOSIS — I10 Essential (primary) hypertension: Secondary | ICD-10-CM | POA: Diagnosis not present

## 2021-10-12 DIAGNOSIS — F5104 Psychophysiologic insomnia: Secondary | ICD-10-CM

## 2021-10-12 DIAGNOSIS — E559 Vitamin D deficiency, unspecified: Secondary | ICD-10-CM | POA: Diagnosis not present

## 2021-10-12 DIAGNOSIS — Z01419 Encounter for gynecological examination (general) (routine) without abnormal findings: Secondary | ICD-10-CM | POA: Diagnosis not present

## 2021-10-12 DIAGNOSIS — E2839 Other primary ovarian failure: Secondary | ICD-10-CM | POA: Diagnosis not present

## 2021-10-12 NOTE — Progress Notes (Signed)
61 y.o. [id number] Married White or Caucasian female here for annual exam.  Doing well.  Denies vaginal bleeding.    Has elevated lipids.  Had coronary calcium score that was normal.  Not on a statin.  Takes 1/3 xanax for sleep.  Patient's last menstrual period was 09/14/2019 (exact date).          Sexually active: Yes.    The current method of family planning is post menopausal status.    Exercising: Yes.    Smoker:  no  Health Maintenance: Pap:  09/22/2020 Negative History of abnormal Pap:  no MMG:  06/12/2021 Negative Colonoscopy:  not done but negative cologuard 2022 BMD:   order Screening Labs: would like Vit D done today.   reports that she has never smoked. She has never used smokeless tobacco. She reports current alcohol use of about 7.0 - 14.0 standard drinks of alcohol per week. She reports that she does not use drugs.  Past Medical History:  Diagnosis Date   Arthritis    thumbs   Endometrial mass    Endometrial polyp    Hypertension    followed by pcp and cardiology    Neck pain, chronic    Palpitations    cardiologist-- dr Stanford Breed---  ETT 03-06-2018 in epic normal with no significant arrythmia;  echo 05-16-2017 normal ;  event monitor 05-16-2017 SR with PVCs   PMB (postmenopausal bleeding)    Seasonal allergies    Seasonal asthma    followd by pcp    Past Surgical History:  Procedure Laterality Date   [place] N/A 11/03/2019   Procedure: DILATATION & CURETTAGE/HYSTEROSCOPY;  Surgeon: Megan Salon, MD;  Location: [facility];  Service: Gynecology;  Laterality: N/A;   DILATION AND CURETTAGE OF UTERUS  2003   retained placenta post SVD   PLACEMENT OF BREAST IMPLANTS Bilateral 1998 approx.   TONSILLECTOMY AND ADENOIDECTOMY  child    Current Outpatient Medications  Medication Sig Dispense Refill   ALPRAZolam (XANAX) 0.5 MG tablet Take 1 tablet (0.5 mg total) by mouth as needed for  anxiety. 30 tablet 0   cetirizine (ZYRTEC) 10 MG tablet Take 10 mg by mouth daily.     hydrALAZINE (APRESOLINE) 25 MG tablet Take 25 mg twice a day if needed for systolic B/P 914 or greater 30 tablet 3   ibuprofen (ADVIL,MOTRIN) 200 MG tablet Take 200 mg by mouth every 6 (six) hours as needed for pain.      levalbuterol (XOPENEX HFA) 45 MCG/ACT inhaler Inhale 1-2 puffs into the lungs every 6 (six) hours as needed for wheezing. 1 each 1   metoprolol succinate (TOPROL-XL) 25 MG 24 hr tablet TAKE 1 TABLET BY MOUTH DAILY 90 tablet 3   Multiple Vitamin (MULTIVITAMIN) tablet Take 1 tablet by mouth daily.     triamcinolone (NASACORT) 55 MCG/ACT nasal inhaler Place 2 sprays into the nose daily as needed.      valsartan (DIOVAN) 80 MG tablet TAKE 1 TABLET BY MOUTH DAILY 90 tablet 3   No current facility-administered medications for this visit.    Family History  Problem Relation Age of Onset   Hypertension Mother    Stroke Mother    Hypertension Father    Dementia Father     ROS: Constitutional: negative Genitourinary:negative  Exam:   BP 134/72 (BP Location: Right Arm, Patient Position: Sitting, Cuff Size: Normal)   Pulse 60   Ht '5\' 5"'$  (1.651  m) Comment: reported  Wt 154 lb 12.8 oz (70.2 kg)   LMP 09/14/2019 (Exact Date)   BMI 25.76 kg/m   Height: '5\' 5"'$  (165.1 cm) (reported)  General appearance: alert, cooperative and appears stated age Head: Normocephalic, without obvious abnormality, atraumatic Neck: no adenopathy, supple, symmetrical, trachea midline and thyroid normal to inspection and palpation Lungs: clear to auscultation bilaterally Breasts: normal appearance, no masses or tenderness Heart: regular rate and rhythm Abdomen: soft, non-tender; bowel sounds normal; no masses,  no organomegaly Extremities: extremities normal, atraumatic, no cyanosis or edema Skin: Skin color, texture, turgor normal. No rashes or lesions Lymph nodes: Cervical, supraclavicular, and axillary nodes  normal. No abnormal inguinal nodes palpated Neurologic: Grossly normal   Pelvic: External genitalia:  no lesions              Urethra:  normal appearing urethra with no masses, tenderness or lesions              Bartholins and Skenes: normal                 Vagina: normal appearing vagina with normal color and no discharge, no lesions              Cervix: no lesions              Pap taken: No. Bimanual Exam:  Uterus:  normal size, contour, position, consistency, mobility, non-tender              Adnexa: normal adnexa and no mass, fullness, tenderness               Rectovaginal: Confirms               Anus:  normal sphincter tone, no lesions  Chaperone, Octaviano Batty, CMA, was present for exam.  Assessment/Plan: 1. Well woman exam with routine gynecological exam - Pap smear 2022 - Mammogram  up to date - Colonoscopy not done but cologuard neg 2022 - Bone mineral density ordered - lab work done done with PCP - vaccines reviewed/updated  2. Hypoestrogenism - DG BONE DENSITY (DXA); Future  3. Vitamin D deficiency - VITAMIN D 25 Hydroxy (Vit-D Deficiency, Fractures)  4. Primary hypertension - followed by cardiology  5. Chronic insomnia - xanax rx done 08/2021.

## 2021-10-13 LAB — VITAMIN D 25 HYDROXY (VIT D DEFICIENCY, FRACTURES): Vit D, 25-Hydroxy: 31.1 ng/mL (ref 30.0–100.0)

## 2021-11-27 ENCOUNTER — Encounter (HOSPITAL_BASED_OUTPATIENT_CLINIC_OR_DEPARTMENT_OTHER): Payer: Self-pay | Admitting: *Deleted

## 2021-11-27 DIAGNOSIS — E2839 Other primary ovarian failure: Secondary | ICD-10-CM

## 2022-02-13 ENCOUNTER — Ambulatory Visit: Payer: Commercial Managed Care - HMO | Admitting: Adult Health

## 2022-02-13 ENCOUNTER — Encounter: Payer: Self-pay | Admitting: Adult Health

## 2022-02-13 VITALS — BP 130/80 | HR 73 | Temp 98.0°F | Ht 65.0 in | Wt 154.0 lb

## 2022-02-13 DIAGNOSIS — H6991 Unspecified Eustachian tube disorder, right ear: Secondary | ICD-10-CM

## 2022-02-13 DIAGNOSIS — J069 Acute upper respiratory infection, unspecified: Secondary | ICD-10-CM | POA: Diagnosis not present

## 2022-02-13 MED ORDER — DOXYCYCLINE HYCLATE 100 MG PO CAPS: 100.0000 mg | ORAL_CAPSULE | Freq: Two times a day (BID) | ORAL | 0 refills | Status: DC

## 2022-02-13 NOTE — Progress Notes (Signed)
Subjective:    Patient ID: Lori Love, female    DOB: 12-Aug-1960, 61 y.o.   MRN: [id number]  HPI  61 year old female who  has a past medical history of Arthritis, Endometrial mass, Endometrial polyp, Hypertension, Neck pain, chronic, Palpitations, PMB (postmenopausal bleeding), Seasonal allergies, and Seasonal asthma.  She presents to the office today for an concern of sinusitis. She reports having sinus infections in the past and symptoms are similar to what she has experienced prior. Her symptoms started 4-5 days ago. Symptoms include that of sinus pain and pressure, fatigue, watery eyes, right ear feels "closed".   She denies fevers or headaches  At home she has been using saline nasal spray and Nasacort.    Her symptoms have stayed pretty stable since they began   Review of Systems See HPI   Past Medical History:  Diagnosis Date   Arthritis    thumbs   Endometrial mass    Endometrial polyp    Hypertension    followed by pcp and cardiology    Neck pain, chronic    Palpitations    cardiologist-- dr Stanford Breed---  ETT 03-06-2018 in epic normal with no significant arrythmia;  echo 05-16-2017 normal ;  event monitor 05-16-2017 SR with PVCs   PMB (postmenopausal bleeding)    Seasonal allergies    Seasonal asthma    followd by pcp    Social History   Socioeconomic History   Marital status: Married    Spouse name: Not on file   Number of children: 3   Years of education: Not on file   Highest education level: Not on file  Occupational History    Comment: Realtor  Tobacco Use   Smoking status: Never   Smokeless tobacco: Never  Vaping Use   Vaping Use: Never used  Substance and Sexual Activity   Alcohol use: Yes    Alcohol/week: 7.0 - 14.0 standard drinks of alcohol    Types: 7 - 14 Glasses of wine per week   Drug use: Never   Sexual activity: Yes    Birth control/protection: Post-menopausal    Comment: vasectomy   Other Topics Concern   Not on file  Social  History Narrative   Not on file   Social Determinants of Health   Financial Resource Strain: Not on file  Food Insecurity: Not on file  Transportation Needs: Not on file  Physical Activity: Not on file  Stress: Not on file  Social Connections: Not on file  Intimate Partner Violence: Not on file    Past Surgical History:  Procedure Laterality Date   [place] N/A 11/03/2019   Procedure: DILATATION & CURETTAGE/HYSTEROSCOPY;  Surgeon: Megan Salon, MD;  Location: [facility];  Service: Gynecology;  Laterality: N/A;   DILATION AND CURETTAGE OF UTERUS  2003   retained placenta post SVD   PLACEMENT OF BREAST IMPLANTS Bilateral 1998 approx.   TONSILLECTOMY AND ADENOIDECTOMY  child    Family History  Problem Relation Age of Onset   Hypertension Mother    Stroke Mother    Hypertension Father    Dementia Father     Allergies  Allergen Reactions   Penicillins Hives   Sulfa Antibiotics Rash    Current Outpatient Medications on File Prior to Visit  Medication Sig Dispense Refill   ALPRAZolam (XANAX) 0.5 MG tablet Take 1 tablet (0.5 mg total) by mouth as needed for anxiety. 30 tablet 0  cetirizine (ZYRTEC) 10 MG tablet Take 10 mg by mouth daily.     hydrALAZINE (APRESOLINE) 25 MG tablet Take 25 mg twice a day if needed for systolic B/P 144 or greater 30 tablet 3   ibuprofen (ADVIL,MOTRIN) 200 MG tablet Take 200 mg by mouth every 6 (six) hours as needed for pain.      levalbuterol (XOPENEX HFA) 45 MCG/ACT inhaler Inhale 1-2 puffs into the lungs every 6 (six) hours as needed for wheezing. 1 each 1   metoprolol succinate (TOPROL-XL) 25 MG 24 hr tablet TAKE 1 TABLET BY MOUTH DAILY 90 tablet 3   Multiple Vitamin (MULTIVITAMIN) tablet Take 1 tablet by mouth daily.     triamcinolone (NASACORT) 55 MCG/ACT nasal inhaler Place 2 sprays into the nose daily as needed.      valsartan (DIOVAN) 80 MG tablet TAKE 1 TABLET  BY MOUTH DAILY 90 tablet 3   No current facility-administered medications on file prior to visit.    BP 130/80   Pulse 73   Temp 98 F (36.7 C) (Oral)   Ht '5\' 5"'$  (1.651 m)   Wt 154 lb (69.9 kg)   LMP 09/14/2019 (Exact Date)   SpO2 98%   BMI 25.63 kg/m       Objective:   Physical Exam Vitals and nursing note reviewed.  Constitutional:      Appearance: Normal appearance.  HENT:     Right Ear: A middle ear effusion is present. Tympanic membrane is bulging. Tympanic membrane is not erythematous.     Left Ear:  No middle ear effusion. Tympanic membrane is not erythematous or bulging.     Nose: Rhinorrhea present. No congestion.     Right Turbinates: Not enlarged or swollen.     Left Turbinates: Not enlarged or swollen.     Left Sinus: Maxillary sinus tenderness and frontal sinus tenderness present.  Cardiovascular:     Rate and Rhythm: Normal rate and regular rhythm.     Pulses: Normal pulses.     Heart sounds: Normal heart sounds.  Pulmonary:     Effort: Pulmonary effort is normal.     Breath sounds: Normal breath sounds.  Neurological:     Mental Status: [name] alert.       Assessment & Plan:  1. Upper respiratory tract infection, unspecified type - advised that she should continue with Nasacort and can add Afrin for the next 2-3 days. If symptoms improving overall then do not take the antibiotics. - doxycycline (VIBRAMYCIN) 100 MG capsule; Take 1 capsule (100 mg total) by mouth 2 (two) times daily.  Dispense: 14 capsule; Refill: 0  2. Dysfunction of right eustachian tube - No signs of ear infection. Advised to continue Nasacort and can add afrin for 2-3 days  - Follow up as needed  Dorothyann Peng, NP

## 2022-02-20 ENCOUNTER — Encounter (HOSPITAL_BASED_OUTPATIENT_CLINIC_OR_DEPARTMENT_OTHER): Payer: Self-pay | Admitting: Obstetrics & Gynecology

## 2022-02-20 ENCOUNTER — Ambulatory Visit (HOSPITAL_BASED_OUTPATIENT_CLINIC_OR_DEPARTMENT_OTHER): Payer: Commercial Managed Care - HMO | Admitting: Obstetrics & Gynecology

## 2022-02-20 VITALS — BP 132/82 | HR 72 | Ht 65.0 in | Wt 159.6 lb

## 2022-02-20 DIAGNOSIS — E349 Endocrine disorder, unspecified: Secondary | ICD-10-CM

## 2022-02-20 DIAGNOSIS — N951 Menopausal and female climacteric states: Secondary | ICD-10-CM | POA: Diagnosis not present

## 2022-02-20 DIAGNOSIS — R232 Flushing: Secondary | ICD-10-CM | POA: Diagnosis not present

## 2022-02-20 MED ORDER — ESTRADIOL-NORETHINDRONE ACET 0.5-0.1 MG PO TABS: 1.0000 | ORAL_TABLET | Freq: Every day | ORAL | 1 refills | Status: DC

## 2022-02-20 NOTE — Progress Notes (Unsigned)
GYNECOLOGY  VISIT  CC:   discuss hormones  HPI: 61 y.o. [id number] Married White or Caucasian female here for discussion of hormonal symptoms.  She's having issues with hot flashes, night sweats and insomnia.  She has used some xanax for these symptoms.  [name] using the xanax more frequently right now and doesn't want to be using this for sleep.  Did have [place] of 80 and estradiol of <5.0.    She has tried melatonin.  She feels hung-over and drugged with this.  Other options including [place] and SSRIs discussed.  She feels most comfortable with HRT right now.  Risks reviewed including MRI, MI and DVT/PE due to clotting and breast cancer.  She does prefer a pill to take.   Past Medical History:  Diagnosis Date   Arthritis    thumbs   Endometrial mass    Endometrial polyp    Hypertension    followed by pcp and cardiology    Neck pain, chronic    Palpitations    cardiologist-- dr Stanford Breed---  ETT 03-06-2018 in epic normal with no significant arrythmia;  echo 05-16-2017 normal ;  event monitor 05-16-2017 SR with PVCs   PMB (postmenopausal bleeding)    Seasonal allergies    Seasonal asthma    followd by pcp    MEDS:   Current Outpatient Medications on File Prior to Visit  Medication Sig Dispense Refill   ALPRAZolam (XANAX) 0.5 MG tablet Take 1 tablet (0.5 mg total) by mouth as needed for anxiety. 30 tablet 0   cetirizine (ZYRTEC) 10 MG tablet Take 10 mg by mouth daily.     doxycycline (VIBRAMYCIN) 100 MG capsule Take 1 capsule (100 mg total) by mouth 2 (two) times daily. 14 capsule 0   hydrALAZINE (APRESOLINE) 25 MG tablet Take 25 mg twice a day if needed for systolic B/P 846 or greater 30 tablet 3   ibuprofen (ADVIL,MOTRIN) 200 MG tablet Take 200 mg by mouth every 6 (six) hours as needed for pain.      levalbuterol (XOPENEX HFA) 45 MCG/ACT inhaler Inhale 1-2 puffs into the lungs every 6 (six) hours as needed for wheezing. 1 each 1   metoprolol succinate (TOPROL-XL) 25 MG 24 hr tablet TAKE  1 TABLET BY MOUTH DAILY 90 tablet 3   Multiple Vitamin (MULTIVITAMIN) tablet Take 1 tablet by mouth daily.     triamcinolone (NASACORT) 55 MCG/ACT nasal inhaler Place 2 sprays into the nose daily as needed.      valsartan (DIOVAN) 80 MG tablet TAKE 1 TABLET BY MOUTH DAILY 90 tablet 3   No current facility-administered medications on file prior to visit.    ALLERGIES: Penicillins and Sulfa antibiotics  SH:  married, non smoker  Review of Systems  Constitutional:  Positive for malaise/fatigue (due to sleep changes).  Genitourinary: Negative.     PHYSICAL EXAMINATION:    BP 132/82   Pulse 72   Ht '5\' 5"'$  (1.651 m) Comment: Reported  Wt 159 lb 9.6 oz (72.4 kg)   LMP 09/14/2019 (Exact Date)   BMI 26.56 kg/m     General appearance: alert, cooperative and appears stated age No other exam performed  Assessment/Plan: 1. Hormonal disorder - will r/o other causes of hot flashes - TSH - Hemoglobin A1c - T4, free  2. Hot flashes  3. Vasomotor symptoms due to menopause - will start HRT, half strength dosing.  Pt will give update in 3-4 weeks. - Estradiol-Norethindrone Acet 0.5-0.1 MG tablet; Take 1 tablet  by mouth daily.  Dispense: 30 tablet; Refill: 1

## 2022-02-21 LAB — HEMOGLOBIN A1C
Est. average glucose Bld gHb Est-mCnc: 111 mg/dL
Hgb A1c MFr Bld: 5.5 % (ref 4.8–5.6)

## 2022-02-21 LAB — TSH: TSH: 1.62 u[IU]/mL (ref 0.450–4.500)

## 2022-02-21 LAB — T4, FREE: Free T4: 1.2 ng/dL (ref 0.82–1.77)

## 2022-03-02 ENCOUNTER — Encounter: Payer: Self-pay | Admitting: Family Medicine

## 2022-03-02 ENCOUNTER — Ambulatory Visit: Payer: Commercial Managed Care - HMO | Admitting: Family Medicine

## 2022-03-02 VITALS — BP 150/80 | HR 82 | Temp 98.2°F | Ht 65.0 in | Wt 161.7 lb

## 2022-03-02 DIAGNOSIS — J069 Acute upper respiratory infection, unspecified: Secondary | ICD-10-CM

## 2022-03-02 MED ORDER — PREDNISONE 20 MG PO TABS: ORAL_TABLET | ORAL | 0 refills | Status: DC

## 2022-03-02 NOTE — Progress Notes (Signed)
Established Patient Office Visit  Subjective   Patient ID: Lori Love, female    DOB: 1960/04/30  Age: 61 y.o. MRN: [id number]  Chief Complaint  Patient presents with   Follow-up   Sinusitis    Patient complains of sinusitis, Tried Doxycycline   Cough    Patient complains of cough, Productive cough with yellow and greenish sputum,     HPI   Lori Love is seen with some ongoing respiratory issues really for about 3 and half weeks now.  She was with her family at [facility] and developed symptoms a week before Thanksgiving.  She was seen here on the 28th and advised to continue Nasacort and short-term Afrin.  She was given prescription for doxycycline and she notes starting that about 3 days later.  She had some right ear fullness at that time.  She states her right ear fullness has persisted somewhat.  She states this feels "clogged ".  She feels like she may be having some intermittent wheezing.  Still playing tennis and in fact played this morning.  No difficulties with running other than some associated cough.  Still has some cough but no fever.  She does feel like she has some intermittent wheeze.  She uses Xopenex as needed for that.  Denies any purulent nasal secretions.  She has hypertension followed by cardiology.  Is on Diovan and also apparently takes low-dose metoprolol.  She thinks her blood pressure is up slightly today secondary to coughing.  Past Medical History:  Diagnosis Date   Arthritis    thumbs   Hypertension    followed by pcp and cardiology    Neck pain, chronic    Palpitations    cardiologist-- dr Stanford Breed---  ETT 03-06-2018 in epic normal with no significant arrythmia;  echo 05-16-2017 normal ;  event monitor 05-16-2017 SR with PVCs   Seasonal allergies    Seasonal asthma    followd by pcp   Past Surgical History:  Procedure Laterality Date   [place] N/A 11/03/2019   Procedure: DILATATION &  CURETTAGE/HYSTEROSCOPY;  Surgeon: Megan Salon, MD;  Location: [place];  Service: Gynecology;  Laterality: N/A;   DILATION AND CURETTAGE OF UTERUS  2003   retained placenta post SVD   PLACEMENT OF BREAST IMPLANTS Bilateral 1998 approx.   TONSILLECTOMY AND ADENOIDECTOMY  child    reports that she has never smoked. She has never used smokeless tobacco. She reports current alcohol use of about 7.0 - 14.0 standard drinks of alcohol per week. She reports that she does not use drugs. family history includes Dementia in her father; Hypertension in her father and mother; Stroke in her mother. Allergies  Allergen Reactions   Penicillins Hives   Sulfa Antibiotics Rash   ' Review of Systems  Constitutional:  Negative for chills and fever.  HENT:  Positive for congestion.   Respiratory:  Positive for cough and wheezing. Negative for hemoptysis and shortness of breath.   Cardiovascular:  Negative for chest pain.      Objective:     BP (!) 150/80 (BP Location: Left Arm, Patient Position: Sitting, Cuff Size: Normal)   Pulse 82   Temp 98.2 F (36.8 C) (Oral)   Ht '5\' 5"'$  (1.651 m)   Wt 161 lb 11.2 oz (73.3 kg)   LMP 09/14/2019 (Exact Date)   SpO2 99%   BMI 26.91 kg/m    Physical Exam Vitals reviewed.  Constitutional:  General: [name] not in acute distress.    Appearance: [name] not ill-appearing.  HENT:     Right Ear: Tympanic membrane normal.     Left Ear: Tympanic membrane normal.     Mouth/Throat:     Mouth: Mucous membranes are moist.     Pharynx: Oropharynx is clear.  Cardiovascular:     Rate and Rhythm: Normal rate and regular rhythm.  Pulmonary:     Effort: Pulmonary effort is normal.     Breath sounds: Normal breath sounds. No rales.  Neurological:     Mental Status: [name] alert.      No results found for any visits on 03/02/22.    The ASCVD Risk score (Arnett DK, et al., 2019) failed to calculate for the following reasons:   The valid  HDL cholesterol range is 20 to 100 mg/dL    Assessment & Plan:   Patient has persistent cough and some postnasal drainage.  Suspect residual from recent viral illness.  May have some mild intermittent reactive airway issues. -No indication for further antibiotics at this time but follow-up immediately for any fever or worsening symptoms -Continue Xopenex as needed -We discussed 5 days of prednisone 20 mg 2 tablets daily -Be in touch if cough not resolving over the next couple of weeks   Carolann Littler, MD

## 2022-03-05 ENCOUNTER — Other Ambulatory Visit (HOSPITAL_BASED_OUTPATIENT_CLINIC_OR_DEPARTMENT_OTHER): Payer: Self-pay | Admitting: Obstetrics & Gynecology

## 2022-03-14 ENCOUNTER — Other Ambulatory Visit (HOSPITAL_BASED_OUTPATIENT_CLINIC_OR_DEPARTMENT_OTHER): Payer: Self-pay | Admitting: Obstetrics & Gynecology

## 2022-03-14 DIAGNOSIS — N951 Menopausal and female climacteric states: Secondary | ICD-10-CM

## 2022-03-16 ENCOUNTER — Other Ambulatory Visit (HOSPITAL_BASED_OUTPATIENT_CLINIC_OR_DEPARTMENT_OTHER): Payer: Self-pay | Admitting: Obstetrics & Gynecology

## 2022-03-22 MED ORDER — ALPRAZOLAM 0.5 MG PO TABS: 0.5000 mg | ORAL_TABLET | ORAL | 0 refills | Status: DC | PRN

## 2022-03-22 NOTE — Telephone Encounter (Signed)
Already addressed

## 2022-04-15 ENCOUNTER — Other Ambulatory Visit: Payer: Self-pay | Admitting: Cardiology

## 2022-04-15 DIAGNOSIS — I10 Essential (primary) hypertension: Secondary | ICD-10-CM

## 2022-06-20 ENCOUNTER — Other Ambulatory Visit: Payer: Self-pay | Admitting: Obstetrics & Gynecology

## 2022-06-20 DIAGNOSIS — Z1231 Encounter for screening mammogram for malignant neoplasm of breast: Secondary | ICD-10-CM

## 2022-07-07 ENCOUNTER — Other Ambulatory Visit (HOSPITAL_BASED_OUTPATIENT_CLINIC_OR_DEPARTMENT_OTHER): Payer: Self-pay | Admitting: Obstetrics & Gynecology

## 2022-07-07 DIAGNOSIS — N951 Menopausal and female climacteric states: Secondary | ICD-10-CM

## 2022-07-09 ENCOUNTER — Ambulatory Visit (INDEPENDENT_AMBULATORY_CARE_PROVIDER_SITE_OTHER): Payer: 59 | Admitting: Family Medicine

## 2022-07-09 ENCOUNTER — Encounter: Payer: Self-pay | Admitting: Family Medicine

## 2022-07-09 VITALS — BP 120/80 | HR 60 | Temp 98.0°F | Wt 160.0 lb

## 2022-07-09 DIAGNOSIS — R3915 Urgency of urination: Secondary | ICD-10-CM

## 2022-07-09 LAB — POCT URINALYSIS DIPSTICK
Bilirubin, UA: NEGATIVE
Blood, UA: NEGATIVE
Glucose, UA: NEGATIVE
Ketones, UA: NEGATIVE
Leukocytes, UA: NEGATIVE
Nitrite, UA: NEGATIVE
Protein, UA: NEGATIVE
Spec Grav, UA: <=1.005 — AB (ref 1.010–1.025)
Urobilinogen, UA: 0.2 E.U./dL
pH, UA: 6 (ref 5.0–8.0)

## 2022-07-09 NOTE — Telephone Encounter (Signed)
LMOVM for pt to call office regarding refill request 

## 2022-07-09 NOTE — Addendum Note (Signed)
Addended by: Carola Rhine on: 07/09/2022 04:50 PM   Modules accepted: Orders

## 2022-07-09 NOTE — Progress Notes (Signed)
   Subjective:    Patient ID: Lori Love, female    DOB: 30-Oct-1960, 62 y.o.   MRN: [id number]  HPI Here for 3 weeks of intermittent urgency to urinate and a pressure sensation in the lower abdomen. No burning or back pain or fever. Her BM's are regular. She had a UTI many years ago, and she says this feels different than that did. She notes that her GYN, Dr. Hyacinth Meeker, started her on low dose estrogen replacement about 4 months ago, and she asks if this could be related. Her UA today is clear.    Review of Systems  Constitutional: Negative.   Respiratory: Negative.    Cardiovascular: Negative.   Gastrointestinal: Negative.   Genitourinary:  Positive for urgency. Negative for difficulty urinating, dysuria, flank pain, hematuria, vaginal bleeding and vaginal discharge.       Objective:   Physical Exam Constitutional:      Appearance: Normal appearance. [name] not ill-appearing.  Cardiovascular:     Rate and Rhythm: Normal rate and regular rhythm.     Pulses: Normal pulses.     Heart sounds: Normal heart sounds.  Pulmonary:     Effort: Pulmonary effort is normal.     Breath sounds: Normal breath sounds.  Abdominal:     General: Abdomen is flat. Bowel sounds are normal. There is no distension.     Palpations: Abdomen is soft. There is no mass.     Tenderness: There is no abdominal tenderness. There is no right CVA tenderness, left CVA tenderness, guarding or rebound.     Hernia: No hernia is present.  Neurological:     Mental Status: [name] alert.           Assessment & Plan:  [name] having urinary urgency, and we will send the urine sample off for a culture. We agreed to not treat with an antibiotic until the culture report comes back. I told her that it is indeed possible that this is related to her estrogen replacement. She will drink plenty of water.  Gershon Crane, MD

## 2022-07-11 LAB — URINE CULTURE
MICRO NUMBER:: 14857787
SPECIMEN QUALITY:: ADEQUATE

## 2022-07-13 ENCOUNTER — Other Ambulatory Visit (HOSPITAL_BASED_OUTPATIENT_CLINIC_OR_DEPARTMENT_OTHER): Payer: Self-pay | Admitting: Cardiology

## 2022-07-18 ENCOUNTER — Encounter (HOSPITAL_BASED_OUTPATIENT_CLINIC_OR_DEPARTMENT_OTHER): Payer: Self-pay | Admitting: Obstetrics & Gynecology

## 2022-07-18 ENCOUNTER — Ambulatory Visit (INDEPENDENT_AMBULATORY_CARE_PROVIDER_SITE_OTHER): Payer: 59 | Admitting: Obstetrics & Gynecology

## 2022-07-18 VITALS — BP 145/73 | HR 58 | Ht 65.0 in | Wt 158.8 lb

## 2022-07-18 DIAGNOSIS — Z780 Asymptomatic menopausal state: Secondary | ICD-10-CM

## 2022-07-18 DIAGNOSIS — R102 Pelvic and perineal pain: Secondary | ICD-10-CM | POA: Diagnosis not present

## 2022-07-18 DIAGNOSIS — R030 Elevated blood-pressure reading, without diagnosis of hypertension: Secondary | ICD-10-CM

## 2022-07-18 LAB — POCT URINALYSIS DIPSTICK
Bilirubin, UA: NEGATIVE
Glucose, UA: NEGATIVE
Ketones, UA: NEGATIVE
Leukocytes, UA: NEGATIVE
Nitrite, UA: NEGATIVE
Protein, UA: NEGATIVE
Spec Grav, UA: 1.01 (ref 1.010–1.025)
Urobilinogen, UA: 0.2 E.U./dL
pH, UA: 5.5 (ref 5.0–8.0)

## 2022-07-18 MED ORDER — ESTRADIOL 0.1 MG/GM VA CREA
TOPICAL_CREAM | VAGINAL | 1 refills | Status: DC
Start: 2022-07-18 — End: 2023-05-01

## 2022-07-23 ENCOUNTER — Telehealth (HOSPITAL_BASED_OUTPATIENT_CLINIC_OR_DEPARTMENT_OTHER): Payer: Self-pay | Admitting: *Deleted

## 2022-07-23 NOTE — Telephone Encounter (Signed)
TC from patient.  She called in follow up from your visit last week for pelvic pressure and urinary urgency although stated feeling better when she was here last Thursday.  Per your note, you would start her on vaginal estrogen.  Lille said that you gave her the option of getting an antibiotic prior to starting vaginal estrogen and that she initially denied.  Since this was not in your note she is now stating feeling worse than she was Thursday therefore requests the antibiotic.  Please advise. Suren Payne CMA

## 2022-07-23 NOTE — Progress Notes (Signed)
GYNECOLOGY  VISIT  CC:   pelvic pressure  HPI: 62 y.o. [id number] Married White or Caucasian female here for complaint of pelvic pressure that has been present for over a month.  It worsened and she was seen by primary care.  She really felt she had a UTI at that time due to associated urinary urgency as well.  However urine culture was negative on 07/09/2022 and she was advised to come see me.  Specific gravity was low with POCT urine testing that same day. Pt reports she was drinking significant amounts of water to see if could "flush" symptoms.  She does report she is much, much better at this point.  Still having a small amount of pressure but not like it was.  Denies vaginal bleeding.    Is on oral HRT.  Feel this is appropriate and she can stay on it.  However, I do think she might benefit from also using some vaginal estrogen cream in case symptoms are related to vaginal tissue changes.  [name] ok with this plan.  Denies dyspareunia.  All other urinary symptoms are gone at this point.  Denies lower back pain as well.   Past Medical History:  Diagnosis Date   Arthritis    thumbs   Hypertension    followed by pcp and cardiology    Neck pain, chronic    Palpitations    cardiologist-- dr Jens Som---  ETT 03-06-2018 in epic normal with no significant arrythmia;  echo 05-16-2017 normal ;  event monitor 05-16-2017 SR with PVCs   Seasonal allergies    Seasonal asthma    followd by pcp    MEDS:   Current Outpatient Medications on File Prior to Visit  Medication Sig Dispense Refill   ALPRAZolam (XANAX) 0.5 MG tablet Take 1 tablet (0.5 mg total) by mouth as needed for anxiety. 30 tablet 0   cetirizine (ZYRTEC) 10 MG tablet Take 10 mg by mouth daily.     Estradiol-Norethindrone Acet 0.5-0.1 MG tablet TAKE 1 TABLET BY MOUTH EVERY DAY 84 tablet 1   hydrALAZINE (APRESOLINE) 25 MG tablet Take 25 mg twice a day if needed for systolic B/P 160 or greater 30 tablet 3   ibuprofen (ADVIL,MOTRIN) 200 MG  tablet Take 200 mg by mouth every 6 (six) hours as needed for pain.      levalbuterol (XOPENEX HFA) 45 MCG/ACT inhaler Inhale 1-2 puffs into the lungs every 6 (six) hours as needed for wheezing. 1 each 1   metoprolol succinate (TOPROL-XL) 25 MG 24 hr tablet TAKE 1 TABLET BY MOUTH EVERY DAY 90 tablet 3   Multiple Vitamin (MULTIVITAMIN) tablet Take 1 tablet by mouth daily.     triamcinolone (NASACORT) 55 MCG/ACT nasal inhaler Place 2 sprays into the nose daily as needed.      valsartan (DIOVAN) 80 MG tablet TAKE 1 TABLET BY MOUTH EVERY DAY 90 tablet 1   No current facility-administered medications on file prior to visit.    ALLERGIES: Penicillins and Sulfa antibiotics  SH:  married, non smoker  Review of Systems  Constitutional: Negative.   Genitourinary:        Mild pelvic pressure    PHYSICAL EXAMINATION:    BP (!) 145/73 (BP Location: Right Arm, Patient Position: Sitting, Cuff Size: Large)   Pulse (!) 58   Ht 5\' 5"  (1.651 m) Comment: Reported  Wt 158 lb 12.8 oz (72 kg)   LMP 09/14/2019 (Exact Date)   BMI 26.43 kg/m  General appearance: alert, cooperative and appears stated age   Assessment/Plan: 1. Pelvic pressure in female - symptoms are much better so will continue to monitor. - possibly she did have UTI with negative culture due to increased water intake but now has resolved  2. Elevated blood pressure reading - advised pt to monitor  3. Postmenopausal - will continue oral HRT and add vaginal estrogen.  Instructions given. - estradiol (ESTRACE) 0.1 MG/GM vaginal cream; 1 gram vaginally twice weekly  Dispense: 42.5 g; Refill: 1

## 2022-07-24 ENCOUNTER — Other Ambulatory Visit (HOSPITAL_BASED_OUTPATIENT_CLINIC_OR_DEPARTMENT_OTHER): Payer: Self-pay | Admitting: Obstetrics & Gynecology

## 2022-07-24 MED ORDER — NITROFURANTOIN MONOHYD MACRO 100 MG PO CAPS: 100.0000 mg | ORAL_CAPSULE | Freq: Two times a day (BID) | ORAL | 0 refills | Status: DC

## 2022-07-25 ENCOUNTER — Ambulatory Visit: Payer: 59

## 2022-07-25 DIAGNOSIS — Z1231 Encounter for screening mammogram for malignant neoplasm of breast: Secondary | ICD-10-CM | POA: Diagnosis not present

## 2022-07-25 NOTE — Addendum Note (Signed)
Addended by: Ina Homes B on: 07/25/2022 10:01 AM   Modules accepted: Orders

## 2022-07-25 NOTE — Telephone Encounter (Signed)
Called patient. She has picked up medication and will give [place] a call at the end of the week with an update. tbw

## 2022-08-06 ENCOUNTER — Other Ambulatory Visit: Payer: Self-pay | Admitting: Family Medicine

## 2022-08-06 NOTE — Telephone Encounter (Signed)
Prescription Request  08/06/2022  LOV: 03/02/2022  What is the name of the medication or equipment? levalbuterol [facility] HFA) 45 MCG/ACT inhaler  Have you contacted your pharmacy to request a refill? Yes   Which pharmacy would you like this sent to?  [id number] - [place], [place] - [address] [place] [address] Phone: [phone] Fax: [phone]   Patient notified that their request is being sent to the clinical staff for review and that they should receive a response within 2 business days.   Please advise at Mobile [phone] (mobile)

## 2022-08-06 NOTE — Addendum Note (Signed)
Addended by: Christy Sartorius on: 08/06/2022 04:16 PM   Modules accepted: Orders

## 2022-08-07 MED ORDER — LEVALBUTEROL TARTRATE 45 MCG/ACT IN AERO: 1.0000 | INHALATION_SPRAY | Freq: Four times a day (QID) | RESPIRATORY_TRACT | 1 refills | Status: DC | PRN

## 2022-08-14 NOTE — Progress Notes (Signed)
ACUTE VISIT Chief Complaint  Patient presents with   Asthma   HPI: Lori Love is a 62 y.o. female with past medical history significant for hypertension, anxiety, seasonal allergies, vitamin D deficiency, and asthma here today complaining of persisting nonproductive cough since 07/29/2022, which she attributes to asthma exacerbation due to recent environmental exposures.  Cough Associated symptoms include postnasal drip and rhinorrhea. Pertinent negatives include no chills, ear pain, fever, heartburn, hemoptysis, myalgias, rash, sore throat or weight loss. The symptoms are aggravated by dust and animals. She has tried a beta-agonist inhaler for the symptoms. Her past medical history is significant for asthma and environmental allergies.   She reports that her asthma is usually well-controlled but has worsened following a stay in a hotel room with air conditioning blowing on her face and increased exposure to her dog's dander at home.  She mentions that her dog is currently experiencing an allergy flare-up, leading to significant shedding and dander, which has aggravated her asthma. She describes her symptoms as persistent coughing and postnasal drip, without wheezing or difficulty breathing. She has no associated fever, chills, or chest pain but mentions a frontal headache from the coughing.  She has renewed her Xapenex inhaler, which she just picked up from her pharmacy, the one she had was expired. [name] currently using it alongside daily Claritin 10 mg and Nasocort nasal spray nightly.  [name] requesting a "cortisone shot", which has helped in the past, approximately seven to nine years ago for similar issues.  Review of Systems  Constitutional:  Negative for activity change, appetite change, chills, fever and weight loss.  HENT:  Positive for postnasal drip and rhinorrhea. Negative for ear pain, sore throat and trouble swallowing.   Respiratory:  Positive for cough. Negative for  hemoptysis.   Gastrointestinal:  Negative for abdominal pain, heartburn, nausea and vomiting.  Musculoskeletal:  Negative for myalgias.  Skin:  Negative for rash.  Allergic/Immunologic: Positive for environmental allergies.  Neurological:  Negative for syncope and weakness.  See other pertinent positives and negatives in HPI.  Current Outpatient Medications on File Prior to Visit  Medication Sig Dispense Refill   ALPRAZolam (XANAX) 0.5 MG tablet Take 1 tablet (0.5 mg total) by mouth as needed for anxiety. 30 tablet 0   cetirizine (ZYRTEC) 10 MG tablet Take 10 mg by mouth daily.     estradiol (ESTRACE) 0.1 MG/GM vaginal cream 1 gram vaginally twice weekly 42.5 g 1   Estradiol-Norethindrone Acet 0.5-0.1 MG tablet TAKE 1 TABLET BY MOUTH EVERY DAY 84 tablet 1   hydrALAZINE (APRESOLINE) 25 MG tablet Take 25 mg twice a day if needed for systolic B/P 160 or greater 30 tablet 3   ibuprofen (ADVIL,MOTRIN) 200 MG tablet Take 200 mg by mouth every 6 (six) hours as needed for pain.      levalbuterol (XOPENEX HFA) 45 MCG/ACT inhaler Inhale 1-2 puffs into the lungs every 6 (six) hours as needed for wheezing. 1 each 1   metoprolol succinate (TOPROL-XL) 25 MG 24 hr tablet TAKE 1 TABLET BY MOUTH EVERY DAY 90 tablet 3   Multiple Vitamin (MULTIVITAMIN) tablet Take 1 tablet by mouth daily.     nitrofurantoin, macrocrystal-monohydrate, (MACROBID) 100 MG capsule Take 1 capsule (100 mg total) by mouth 2 (two) times daily. 10 capsule 0   triamcinolone (NASACORT) 55 MCG/ACT nasal inhaler Place 2 sprays into the nose daily as needed.      valsartan (DIOVAN) 80 MG tablet TAKE 1 TABLET BY MOUTH EVERY  DAY 90 tablet 1   No current facility-administered medications on file prior to visit.   Past Medical History:  Diagnosis Date   Arthritis    thumbs   Hypertension    followed by pcp and cardiology    Neck pain, chronic    Palpitations    cardiologist-- dr Jens Som---  ETT 03-06-2018 in epic normal with no  significant arrythmia;  echo 05-16-2017 normal ;  event monitor 05-16-2017 SR with PVCs   Seasonal allergies    Seasonal asthma    followd by pcp   Allergies  Allergen Reactions   Penicillins Hives   Sulfa Antibiotics Rash    Social History   Socioeconomic History   Marital status: Married    Spouse name: Not on file   Number of children: 3   Years of education: Not on file   Highest education level: Not on file  Occupational History    Comment: Realtor  Tobacco Use   Smoking status: Never   Smokeless tobacco: Never  Vaping Use   Vaping Use: Never used  Substance and Sexual Activity   Alcohol use: Yes    Alcohol/week: 7.0 - 14.0 standard drinks of alcohol    Types: 7 - 14 Glasses of wine per week   Drug use: Never   Sexual activity: Yes    Birth control/protection: Post-menopausal    Comment: vasectomy   Other Topics Concern   Not on file  Social History Narrative   Not on file   Social Determinants of Health   Financial Resource Strain: Not on file  Food Insecurity: Not on file  Transportation Needs: Not on file  Physical Activity: Not on file  Stress: Not on file  Social Connections: Not on file   Vitals:   08/15/22 1230  BP: 124/80  Pulse: 79  Resp: 12  Temp: 98.5 F (36.9 C)  SpO2: 99%   Body mass index is 26.02 kg/m.  Physical Exam Vitals and nursing note reviewed.  Constitutional:      General: [name] not in acute distress.    Appearance: [name] well-developed. [name] not ill-appearing.  HENT:     Head: Normocephalic and atraumatic.     Nose:     Right Turbinates: Enlarged.     Left Turbinates: Enlarged.     Mouth/Throat:     Mouth: Mucous membranes are moist.     Pharynx: Oropharynx is clear.  Eyes:     Conjunctiva/sclera: Conjunctivae normal.  Cardiovascular:     Rate and Rhythm: Normal rate and regular rhythm.     Heart sounds: No murmur heard. Pulmonary:     Effort: Pulmonary effort is normal. No respiratory distress.      Breath sounds: Normal breath sounds. No stridor. No wheezing, rhonchi or rales.     Comments: Cough trigger by force expiration. Musculoskeletal:     Cervical back: No edema or erythema. No muscular tenderness.  Lymphadenopathy:     Head:     Right side of head: No submandibular adenopathy.     Left side of head: No submandibular adenopathy.     Cervical: No cervical adenopathy.  Skin:    General: Skin is warm.     Findings: No erythema or rash.  Neurological:     Mental Status: [name] alert and oriented to person, place, and time.  Psychiatric:        Mood and Affect: Mood and affect normal.   ASSESSMENT AND PLAN:  Ms. Kubek  was seen today for persistent cough and asthma.  Mild intermittent asthma with exacerbation No wheezing appreciated on auscultation. She has had similar symptoms in the past and resolved with systemic steroid. We discussed options, Prednisone vs DepoMedrol, she prefers the latter one. Some side effects discussed. Xopenex inh 2 puff every 6 hours for a week then as needed for wheezing or shortness of breath.   Instructed about warning signs.   -     methylPREDNISolone Acetate  Cough, unspecified type We discussed possible etiologies. Hx and examination do not suggest a serious process. I do not think imaging is needed at this time. Could be related to allergies as well as asthma. Monitor for new symptoms and follow with PCP is problem is persistent.  Return if symptoms worsen or fail to improve.  Kristalynn Coddington G. [place], MD  [facility]. [place] office.

## 2022-08-15 ENCOUNTER — Encounter: Payer: Self-pay | Admitting: Family Medicine

## 2022-08-15 ENCOUNTER — Ambulatory Visit: Payer: 59 | Admitting: Family Medicine

## 2022-08-15 VITALS — BP 124/80 | HR 79 | Temp 98.5°F | Resp 12 | Ht 65.0 in | Wt 156.4 lb

## 2022-08-15 DIAGNOSIS — R059 Cough, unspecified: Secondary | ICD-10-CM | POA: Diagnosis not present

## 2022-08-15 DIAGNOSIS — J4521 Mild intermittent asthma with (acute) exacerbation: Secondary | ICD-10-CM | POA: Diagnosis not present

## 2022-08-15 MED ORDER — METHYLPREDNISOLONE ACETATE 40 MG/ML IJ SUSP
40.0000 mg | Freq: Once | INTRAMUSCULAR | Status: AC
Start: 2022-08-15 — End: 2022-08-15
  Administered 2022-08-15: 40 mg via INTRAMUSCULAR

## 2022-08-15 NOTE — Patient Instructions (Addendum)
A few things to remember from today's visit:  Mild intermittent asthma with exacerbation  Cough, unspecified type Xapenex inh 2 puff every 6 hours for a week then as needed for wheezing or shortness of breath.  DepoMedrol 40 mg given today. Monitor for new symptoms.  Do not use My Chart to request refills or for acute issues that need immediate attention. If you send a my chart message, it may take a few days to be addressed, specially if I am not in the office.  Please be sure medication list is accurate. If a new problem present, please set up appointment sooner than planned today.

## 2022-08-21 NOTE — Progress Notes (Unsigned)
   Rubin Payor, PhD, LAT, ATC acting as a scribe for Clementeen Graham, MD.   Lori Love is a 62 y.o. female who presents to [facility] today for elbow pain x ***. Pt locates pain to ***  Radiates: Grip strength: Paresthesia: Aggravates: Treatments tried:  Pertinent review of systems: ***  Relevant historical information: ***   Exam:  LMP 09/14/2019 (Exact Date)  General: Well Developed, well nourished, and in no acute distress.   MSK: ***    Lab and Radiology Results No results found for this or any previous visit (from the past 72 hour(s)). No results found.     Assessment and Plan: 62 y.o. female with ***   PDMP not reviewed this encounter. No orders of the defined types were placed in this encounter.  No orders of the defined types were placed in this encounter.    Discussed warning signs or symptoms. Please see discharge instructions. Patient expresses understanding.   ***

## 2022-08-22 ENCOUNTER — Other Ambulatory Visit: Payer: Self-pay

## 2022-08-22 ENCOUNTER — Ambulatory Visit: Payer: 59 | Admitting: Family Medicine

## 2022-08-22 VITALS — BP 116/74 | HR 61 | Ht 65.0 in | Wt 156.0 lb

## 2022-08-22 DIAGNOSIS — M25521 Pain in right elbow: Secondary | ICD-10-CM

## 2022-08-22 DIAGNOSIS — M7711 Lateral epicondylitis, right elbow: Secondary | ICD-10-CM | POA: Diagnosis not present

## 2022-08-22 NOTE — Patient Instructions (Addendum)
Thank you for coming in today.   [facility] or [facility] in [place] in 4 weeks.

## 2022-08-23 ENCOUNTER — Other Ambulatory Visit: Payer: Self-pay

## 2022-08-23 ENCOUNTER — Encounter: Payer: Self-pay | Admitting: Rehabilitative and Restorative Service Providers"

## 2022-08-23 ENCOUNTER — Ambulatory Visit: Payer: 59 | Admitting: Rehabilitative and Restorative Service Providers"

## 2022-08-23 DIAGNOSIS — M25521 Pain in right elbow: Secondary | ICD-10-CM

## 2022-08-23 DIAGNOSIS — M6281 Muscle weakness (generalized): Secondary | ICD-10-CM

## 2022-08-23 DIAGNOSIS — R278 Other lack of coordination: Secondary | ICD-10-CM

## 2022-08-23 DIAGNOSIS — R600 Localized edema: Secondary | ICD-10-CM

## 2022-08-23 NOTE — Therapy (Signed)
OUTPATIENT OCCUPATIONAL THERAPY ORTHO EVALUATION  Patient Name: Lori Love MRN: [id number] DOB:11/03/1960, 62 y.o., female Today's Date: 08/23/2022  PCP: Evelena Peat, MD REFERRING PROVIDER: Rodolph Bong, MD   END OF SESSION:  OT End of Session - 08/23/22 1105     Visit Number 1    Number of Visits 12    Date for OT Re-Evaluation 10/05/22    Authorization Type [facility]    OT Start Time 1106    OT Stop Time 1205    OT Time Calculation (min) 59 min    Activity Tolerance Patient tolerated treatment well;Patient limited by fatigue;Patient limited by pain    Behavior During Therapy [facility] for tasks assessed/performed             Past Medical History:  Diagnosis Date   Arthritis    thumbs   Hypertension    followed by pcp and cardiology    Neck pain, chronic    Palpitations    cardiologist-- dr Jens Som---  ETT 03-06-2018 in epic normal with no significant arrythmia;  echo 05-16-2017 normal ;  event monitor 05-16-2017 SR with PVCs   Seasonal allergies    Seasonal asthma    followd by pcp   Past Surgical History:  Procedure Laterality Date   APPENDECTOMY  1996   DILATATION & CURETTAGE/HYSTEROSCOPY WITH MYOSURE N/A 11/03/2019   Procedure: DILATATION & CURETTAGE/HYSTEROSCOPY;  Surgeon: Jerene Bears, MD;  Location: [facility] [place];  Service: Gynecology;  Laterality: N/A;   DILATION AND CURETTAGE OF UTERUS  2003   retained placenta post SVD   PLACEMENT OF BREAST IMPLANTS Bilateral 1998 approx.   TONSILLECTOMY AND ADENOIDECTOMY  child   Patient Active Problem List   Diagnosis Date Noted   Lateral epicondylitis of right elbow 03/03/2015   Right elbow pain 03/03/2015   Vitamin D deficiency 11/25/2009   Hypertension 04/06/2009   Asthma, intermittent with acute exacerbation 04/06/2009   Asthma 10/21/2008   Positive ANA (antinuclear antibody) 10/21/2008   Anxiety 04/17/2007   Palpitations 04/10/2007   Scoliosis 04/07/2007    ONSET DATE: Feb 2024  injury to Rt elbow (acute on Chronic, "years" of issues)  REFERRING DIAG: M25.521 (ICD-10-CM) - Right elbow painM77.11 (ICD-10-CM) - Lateral epicondylitis of right elbow  THERAPY DIAG:  Muscle weakness (generalized)  Localized edema  Other lack of coordination  Pain in right elbow  Rationale for Evaluation and Treatment: Rehabilitation  SUBJECTIVE:   SUBJECTIVE STATEMENT: She states working out a lot, feeling some elbow pain, "gave it 2 weeks" then started a tennis season, elbow continued to hurt "gave it 2 more weeks."  She did a zip-lining which hurt elbow, and then more tennis caused a very bad pain a week ago- felt a big "pop" and a terrible pain.  She describes getting therapy in the past for this issue and essentially years of pain and problems without ever resting and obviously not managing it well.  She also describes medial elbow pain and medial epicondylitis symptoms today.  Fortunately she has very little pain now but having high pain tolerance has been somewhat detrimental for her thus far.   PERTINENT HISTORY: Per MD notes: "R elbow pain ongoing since Feb worsening last Thursday. She noticed the elbow pain soon after switching gyms to [employer]. She does play tennis regularly. She went to underhand serve and felt an explosion of pain. Pt locates pain to the medial and lateral aspects of the R elbow.  Her symptoms are much more significant on  the lateral aspect of the elbow...right elbow pain thought to be predominantly due to a partial tear of the lateral epicondyle. This is effectively pretty significant lateral epicondylitis with a tear."   PRECAUTIONS: None;  WEIGHT BEARING RESTRICTIONS: Yes recommended no lift/push/pull now more than 3-5# or anything painful   PAIN:  Are you having pain? Yes: NPRS scale: 1/10 ache and at worst in past week when playing tennis was 8-9/10 Pain location: lateral and medial epicondyles of the Rt elbow (L > M)  Pain description: sharp at times,  aching at rest  Aggravating factors: pushing, pulling, etc.  Relieving factors: heat, rest   FALLS: Has patient fallen in last 6 months? No  LIVING ENVIRONMENT: Lives with: lives alone  PLOF: Independent  PATIENT GOALS: To prevent further problems to the right elbow, decrease pain, increase strength and ability to eventually return to sports and activities.  Avoid surgery   OBJECTIVE: (All objective assessments below are from initial evaluation on: 08/23/22 unless otherwise specified.)   HAND DOMINANCE: Right   ADLs: Overall ADLs: States decreased ability to grab, hold household objects, pain and inability to open containers, perform FMS tasks (manipulate fasteners on clothing), mild to moderate bathing problems as well.    FUNCTIONAL OUTCOME MEASURES: Eval: Quck DASH 50% impairment today  (Higher % Score  =  More Impairment)      UPPER EXTREMITY ROM     Shoulder to Wrist AROM Right eval  Shoulder flexion   Shoulder abduction   Shoulder extension   Shoulder internal rotation   Shoulder external rotation   Elbow flexion full  Elbow extension full  Forearm supination 83  Forearm pronation  85  Wrist flexion 76  Wrist extension 79  Wrist ulnar deviation   Wrist radial deviation   Functional dart thrower's motion (F-DTM) in ulnar flexion   F-DTM in radial extension    (Blank rows = not tested)   Hand AROM Right eval  Full Fist Ability (or Gap to Distal Palmar Crease) yes  Thumb Opposition  (Kapandji Scale)  full  (Blank rows = not tested)   UPPER EXTREMITY MMT:    Eval: measured approximately, carefully to not exacerbate tear   MMT Right 08/23/22  Shoulder flexion   Shoulder abduction   Shoulder adduction   Shoulder extension   Shoulder internal rotation   Shoulder external rotation   Middle trapezius   Lower trapezius   Elbow flexion 4/5  Elbow extension 4-/5 pain  Forearm supination 4-/5 pain  Forearm pronation 4/5 tender  Wrist flexion 4+/5  Wrist  extension 4-/5 pain  Wrist ulnar deviation   Wrist radial deviation   (Blank rows = not tested)  HAND FUNCTION: Eval: Observed weakness in affected Rt hand, details TBD.  Grip strength Right: TBD lbs, Left: TBD lbs   COORDINATION: Eval: Observed coordination impairments with affected hand. Box and Blocks Test: Rt TBD Blocks today (56 is WFL)  SENSATION: Eval:  Light touch intact today  EDEMA:   Eval:  Swollen lump at lateral epicondyle today that she states is not overly tender    COGNITION: Eval: Overall cognitive status: WFL for evaluation today   OBSERVATIONS:   Eval: She has tenderness to the medial and lateral epicondyles of the right elbow, but is surprisingly not very tender despite describing a tear last week.  She appears to have a high pain tolerance as she is routinely hurt herself but restarted high-level sports within 1 to 2 weeks afterwards.  Wrist extension  and supination are more painful than pronation and wrist flexion today.   TODAY'S TREATMENT:  Post-evaluation treatment:  Custom orthotic fabrication was indicated due to pt's chronically painful and torn Rt ECRB muscle/extensor wad, as well as complications with medial epicondylitis and need for safe, functional positioning. OT fabricated custom wrist immobilization orthosis in ~15* wrist ext for pt today to help rest, heal tear, pain. It fit well with no areas of pressure, pt states a comfortable fit after several adjustments. Pt was educated on the wearing schedule (all times but hygiene and exercises), to remove if it is causing any irritation or is not achieving desired function. It will be checked/adjusted in upcoming sessions, as needed. Pt states understanding.   She was also given education to begin gentle tricep stretches as tolerated 3 times a day, 3 times for 15 seconds each time as well as do gentle massage around the medial and lateral epicondyles for 2 to 3 minutes at a time nonpainfully.  She can use  moist heat before beginning these activities.  Otherwise, for her self-care, she should be resting and not performing any pushing pulling sports, etc. for at least 2 to 3 months most likely.    PATIENT EDUCATION: Education details: See tx section above for details  Person educated: Patient Education method: Verbal Instruction, Teach back, Handouts  Education comprehension: States and demonstrates understanding, Additional Education required    HOME EXERCISE PROGRAM: See tx section above for details    GOALS: Goals reviewed with patient? Yes   SHORT TERM GOALS: (STG required if POC>30 days) Target Date: 09/07/22  Pt will obtain protective, custom orthotic. Goal status:MET, but may require counter force brace as well   2.  Pt will demo/state understanding of initial HEP to improve pain levels and prerequisite motion. Goal status: INITIAL   LONG TERM GOALS: Target Date: 10/19/22  Pt will improve functional ability by decreased impairment per Quick DASH assessment from 50% to 20% or better, for better quality of life. Goal status: INITIAL  2.  Pt will improve grip strength in Rt hand from TBD to at least TBDlbs for functional use at home and in IADLs. Goal status: INITIAL- TBD baseline next session   3.  Pt will improve strength in Rt wrist flex/ext from 4-/5 painful MMT to at least 4+/5 MMT to have increased functional ability to carry out selfcare and higher-level homecare tasks with no difficulty. Goal status: INITIAL  5.  Pt will improve coordination skills in Rt arm, as seen by [facility], non-painful score on bo and blocks testing to have increased functional ability to carry out fine motor tasks (fasteners, etc.) and more complex, coordinated IADLs (meal prep, sports, etc.).  Goal status: INITIAL- TBD baseline next session  6.  Pt will decrease pain at worst from 7-8/10 to 2-3/10, during reaching pushing/pulling, etc., to have better sleep and occupational participation in daily  roles. Goal status: INITIAL   ASSESSMENT:  CLINICAL IMPRESSION: Patient is a 62 y.o. female who was seen today for occupational therapy evaluation for painful right elbow after years of wear and tear, chronic kidney use, neglecting her pain symptoms to the point where she now has torn her extensor wad from her lateral epicondyle.  She also has medial epicondylitis-likely from overcompensation.  She will benefit from outpatient occupational therapy due to help prevent this damaging, vicious circle and ultimately improve quality of life.   PERFORMANCE DEFICITS: in functional skills including ADLs, IADLs, coordination, strength, pain, fascial restrictions, muscle spasms,  Gross motor control, body mechanics, endurance, decreased knowledge of precautions, and UE functional use, cognitive skills including problem solving and safety awareness, and psychosocial skills including coping strategies, habits, and routines and behaviors.   IMPAIRMENTS: are limiting patient from ADLs, IADLs, rest and sleep, leisure, and social participation.   COMORBIDITIES: may have co-morbidities  that affects occupational performance. Patient will benefit from skilled OT to address above impairments and improve overall function.  MODIFICATION OR ASSISTANCE TO COMPLETE EVALUATION: No modification of tasks or assist necessary to complete an evaluation.  OT OCCUPATIONAL PROFILE AND HISTORY: Problem focused assessment: Including review of records relating to presenting problem.  CLINICAL DECISION MAKING: Moderate - several treatment options, min-mod task modification necessary  REHAB POTENTIAL: Excellent  EVALUATION COMPLEXITY: Low      PLAN:  OT FREQUENCY: 2x/week  OT DURATION: 8 weeks  PLANNED INTERVENTIONS: self care/ADL training, therapeutic exercise, therapeutic activity, neuromuscular re-education, manual therapy, passive range of motion, splinting, electrical stimulation, ultrasound, iontophoresis,  compression bandaging, moist heat, cryotherapy, patient/family education, coping strategies training, and Dry needling  RECOMMENDED OTHER SERVICES: none now   CONSULTED AND AGREED WITH PLAN OF CARE: Patient  PLAN FOR NEXT SESSION:   Check orthosis and modify as needed, consider discussing counterforce brace or trying to fabricate 1, check range of motion try to prevent tightening.  Progress slowly to prevent exacerbation and caution as she has a very high pain tolerance.   Fannie Knee, OTR/L, CHT 08/23/2022, 12:48 PM

## 2022-08-28 ENCOUNTER — Encounter: Payer: 59 | Admitting: Rehabilitative and Restorative Service Providers"

## 2022-08-29 NOTE — Therapy (Signed)
OUTPATIENT OCCUPATIONAL THERAPY TREATMENT NOTE  Patient Name: Lori Love MRN: [id number] DOB:May 23, 1960, 62 y.o., female Today's Date: 08/30/2022  PCP: Evelena Peat, MD REFERRING PROVIDER: Rodolph Bong, MD   END OF SESSION:  OT End of Session - 08/30/22 1519     Visit Number 2    Number of Visits 12    Date for OT Re-Evaluation 10/05/22    Authorization Type [facility] [facility] [facility]    OT Start Time 1521    OT Stop Time 1632    OT Time Calculation (min) 71 min    Activity Tolerance Patient tolerated treatment well;Patient limited by fatigue;Patient limited by pain    Behavior During Therapy [facility] for tasks assessed/performed              Past Medical History:  Diagnosis Date   Arthritis    thumbs   Hypertension    followed by pcp and cardiology    Neck pain, chronic    Palpitations    cardiologist-- dr Jens Som---  ETT 03-06-2018 in epic normal with no significant arrythmia;  echo 05-16-2017 normal ;  event monitor 05-16-2017 SR with PVCs   Seasonal allergies    Seasonal asthma    followd by pcp   Past Surgical History:  Procedure Laterality Date   APPENDECTOMY  1996   DILATATION & CURETTAGE/HYSTEROSCOPY WITH MYOSURE N/A 11/03/2019   Procedure: DILATATION & CURETTAGE/HYSTEROSCOPY;  Surgeon: Jerene Bears, MD;  Location: [facility];  Service: Gynecology;  Laterality: N/A;   DILATION AND CURETTAGE OF UTERUS  2003   retained placenta post SVD   PLACEMENT OF BREAST IMPLANTS Bilateral 1998 approx.   TONSILLECTOMY AND ADENOIDECTOMY  child   Patient Active Problem List   Diagnosis Date Noted   Lateral epicondylitis of right elbow 03/03/2015   Right elbow pain 03/03/2015   Vitamin D deficiency 11/25/2009   Hypertension 04/06/2009   Asthma, intermittent with acute exacerbation 04/06/2009   Asthma 10/21/2008   Positive ANA (antinuclear antibody) 10/21/2008   Anxiety 04/17/2007   Palpitations 04/10/2007   Scoliosis 04/07/2007    ONSET DATE: Feb 2024  injury to Rt elbow (acute on Chronic, "years" of issues)  REFERRING DIAG: M25.521 (ICD-10-CM) - Right elbow painM77.11 (ICD-10-CM) - Lateral epicondylitis of right elbow  THERAPY DIAG:  Other lack of coordination  Muscle weakness (generalized)  Localized edema  Pain in right elbow  Rationale for Evaluation and Treatment: Rehabilitation  PERTINENT HISTORY: Per MD notes: "R elbow pain ongoing since Feb worsening last Thursday. She noticed the elbow pain soon after switching gyms to [employer]. She does play tennis regularly. She went to underhand serve and felt an explosion of pain. Pt locates pain to the medial and lateral aspects of the R elbow.  Her symptoms are much more significant on the lateral aspect of the elbow...right elbow pain thought to be predominantly due to a partial tear of the lateral epicondyle. This is effectively pretty significant lateral epicondylitis with a tear."  She states working out a lot, feeling some elbow pain, "gave it 2 weeks" then started a tennis season, elbow continued to hurt "gave it 2 more weeks."  She did a zip-lining which hurt elbow, and then more tennis caused a very bad pain a week ago- felt a big "pop" and a terrible pain.  She describes getting therapy in the past for this issue and essentially years of pain and problems without ever resting and obviously not managing it well.  She also describes medial elbow pain  and medial epicondylitis symptoms today.  Fortunately she has very little pain now but having high pain tolerance has been somewhat detrimental for her thus far.  PRECAUTIONS: None;  WEIGHT BEARING RESTRICTIONS: Yes recommended no lift/push/pull now more than 3-5# or anything painful   SUBJECTIVE:   SUBJECTIVE STATEMENT: She states that she only wore the orthosis "30% of the time" since last seen because she could not "lift furniture or heavy pots and pans with it on."  OT reminds her that she was not supposed to be lifting furniture or  heavy objects.  Fortunately she states feeling better and is less swollen today so perhaps the 30% still helped.    PAIN:  Are you having pain?   Little to no pain to start today  Pain location: lateral and medial epicondyles of the Rt elbow (L > M)  Pain description: sharp at times, aching at rest  Aggravating factors: pushing, pulling, etc.  Relieving factors: heat, rest   PATIENT GOALS: To prevent further problems to the right elbow, decrease pain, increase strength and ability to eventually return to sports and activities.  Avoid surgery   OBJECTIVE: (All objective assessments below are from initial evaluation on: 08/23/22 unless otherwise specified.)   HAND DOMINANCE: Right   ADLs: Overall ADLs: States decreased ability to grab, hold household objects, pain and inability to open containers, perform FMS tasks (manipulate fasteners on clothing), mild to moderate bathing problems as well.    FUNCTIONAL OUTCOME MEASURES: Eval: Quck DASH 50% impairment today  (Higher % Score  =  More Impairment)      UPPER EXTREMITY ROM     Shoulder to Wrist AROM Right eval  Shoulder flexion   Shoulder abduction   Shoulder extension   Shoulder internal rotation   Shoulder external rotation   Elbow flexion full  Elbow extension full  Forearm supination 83  Forearm pronation  85  Wrist flexion 76  Wrist extension 79  Wrist ulnar deviation   Wrist radial deviation   Functional dart thrower's motion (F-DTM) in ulnar flexion   F-DTM in radial extension    (Blank rows = not tested)   Hand AROM Right eval  Full Fist Ability (or Gap to Distal Palmar Crease) yes  Thumb Opposition  (Kapandji Scale)  full  (Blank rows = not tested)   UPPER EXTREMITY MMT:    Eval: measured approximately, carefully to not exacerbate tear   MMT Right 08/23/22  Shoulder flexion   Shoulder abduction   Shoulder adduction   Shoulder extension   Shoulder internal rotation   Shoulder external rotation    Middle trapezius   Lower trapezius   Elbow flexion 4/5  Elbow extension 4-/5 pain  Forearm supination 4-/5 pain  Forearm pronation 4/5 tender  Wrist flexion 4+/5  Wrist extension 4-/5 pain  Wrist ulnar deviation   Wrist radial deviation   (Blank rows = not tested)  HAND FUNCTION: Eval: Observed weakness in affected Rt hand, details TBD.   COORDINATION: BTD Box and Blocks Test: Rt TBD Blocks today (56 is WFL)   EDEMA:   Eval:  Swollen lump at lateral epicondyle today that she states is not overly tender    OBSERVATIONS:   08/30/22: swelling much improved around elbow today.   Eval: She has tenderness to the medial and lateral epicondyles of the right elbow, but is surprisingly not very tender despite describing a tear last week.  She appears to have a high pain tolerance as she is routinely hurt  herself but restarted high-level sports within 1 to 2 weeks afterwards.  Wrist extension and supination are more painful than pronation and wrist flexion today.   TODAY'S TREATMENT:  08/30/22: As she states trying to do harsh activities at home in the past week like lifting heavy pots and pans, scrubbing floors, moving furniture, moving fireplace, and several more-OT discusses this with her/reminds her that she was advised to not lift push pull anything more than 5 pounds.  She has been completely noncompliant with this and with the wear of the orthosis for the most part, but somehow is slightly better today.  She does state "trying" to avoid certain things so perhaps that was helpful in itself.   As she was not a large fan of the orthosis, OT educates on wear of counterforce strap for tennis elbow issues-shown her how to put on correctly and ensure that she is wearing it correctly.  She states having 1 at home and was advised to wear that full-time loosely, and tightening up with any heavy activities.  She can also wear the orthosis still if she chooses and she was educated that maximal support  would be both orthosis and counterforce brace concurrently.  OT then performs manual therapy IASTM to the tight and spasming lateral shoulder, triceps, dorsal forearm taking care not to cause pain.  OT palpates several large muscle spasms that seem to benefit from this treatment today and she states feeling a bit looser afterwards.  Next she is given the following home exercise program to perform 3-4 times a day nonpainfully, and each 1 is explained and demonstrated to her with her demonstrating back with no pain.  She does state some mild soreness in the right lateral epicondyle after she has done for which she was advised to use ice at the end if needed for 5 minutes.  She can also continue moist heat and manual massage around the lateral epicondyle before beginning stretches.  She finishes with radial nerve glide as her tightness tends to run through the back of her shoulder, tricep, lateral epicondyle, dorsal forearm-the path of the radial nerve.  She does not feel much tension there with radial nerve glide, however the range of motion that she can perform in this glide improves by 6 degrees post stretches compared to prestretches.   Exercises - Standing neck/upper traps stretch  - 4-6 x daily - 3-5 reps - 15 sec hold - Sleeper Stretch  - 3-4 x daily - 5 reps - 15-20 sec hold - Tricep Stretch- DO SEATED BY TABLE  - 3-4 x daily - 3-5 reps - 15 hold - Seated Wrist Flexion Stretch  - 3-4 x daily - 3 reps - 15 second  hold - Standing Radial Nerve Glide  - 4-6 x daily - 1 sets - 10-15 reps   PATIENT EDUCATION: Education details: See tx section above for details  Person educated: Patient Education method: Verbal Instruction, Teach back, Handouts  Education comprehension: States and demonstrates understanding, Additional Education required    HOME EXERCISE PROGRAM: Access Code: [email or web address] URL: [email or web address] Date: 08/30/2022 Prepared by: Fannie Knee   GOALS: Goals  reviewed with patient? Yes   SHORT TERM GOALS: (STG required if POC>30 days) Target Date: 09/07/22  Pt will obtain protective, custom orthotic. Goal status:MET, but may require counter force brace as well   2.  Pt will demo/state understanding of initial HEP to improve pain levels and prerequisite motion. Goal status: INITIAL   LONG TERM GOALS: Target Date:  10/19/22  Pt will improve functional ability by decreased impairment per Quick DASH assessment from 50% to 20% or better, for better quality of life. Goal status: INITIAL  2.  Pt will improve grip strength in Rt hand from TBD to at least TBDlbs for functional use at home and in IADLs. Goal status: INITIAL- TBD baseline next session   3.  Pt will improve strength in Rt wrist flex/ext from 4-/5 painful MMT to at least 4+/5 MMT to have increased functional ability to carry out selfcare and higher-level homecare tasks with no difficulty. Goal status: INITIAL  5.  Pt will improve coordination skills in Rt arm, as seen by [facility], non-painful score on bo and blocks testing to have increased functional ability to carry out fine motor tasks (fasteners, etc.) and more complex, coordinated IADLs (meal prep, sports, etc.).  Goal status: INITIAL- TBD baseline next session  6.  Pt will decrease pain at worst from 7-8/10 to 2-3/10, during reaching pushing/pulling, etc., to have better sleep and occupational participation in daily roles. Goal status: INITIAL   ASSESSMENT:  CLINICAL IMPRESSION: 08/30/22: Although she was noncompliant with orthosis and recommendations to do no lifting or pushing and pulling, OT does believe that she tried to hold herself back somewhat and this did help her rest compared to her normal regular schedule.  Swelling looks better, she is tolerating light stretches well now, she should be wearing a counterforce brace at least.  Carry on   PLAN:  OT FREQUENCY: 2x/week  OT DURATION: 8 weeks  PLANNED INTERVENTIONS: self  care/ADL training, therapeutic exercise, therapeutic activity, neuromuscular re-education, manual therapy, passive range of motion, splinting, electrical stimulation, ultrasound, iontophoresis, compression bandaging, moist heat, cryotherapy, patient/family education, coping strategies training, and Dry needling  CONSULTED AND AGREED WITH PLAN OF CARE: Patient  PLAN FOR NEXT SESSION:  Review new exercise program, continue manual therapy and modalities as needed, address medial elbow as needed as well.   Fannie Knee, OTR/L, CHT 08/30/2022, 4:36 PM

## 2022-08-30 ENCOUNTER — Ambulatory Visit: Payer: 59 | Admitting: Rehabilitative and Restorative Service Providers"

## 2022-08-30 ENCOUNTER — Encounter: Payer: Self-pay | Admitting: Rehabilitative and Restorative Service Providers"

## 2022-08-30 DIAGNOSIS — M6281 Muscle weakness (generalized): Secondary | ICD-10-CM | POA: Diagnosis not present

## 2022-08-30 DIAGNOSIS — R278 Other lack of coordination: Secondary | ICD-10-CM

## 2022-08-30 DIAGNOSIS — R600 Localized edema: Secondary | ICD-10-CM

## 2022-08-30 DIAGNOSIS — M25521 Pain in right elbow: Secondary | ICD-10-CM

## 2022-09-03 NOTE — Therapy (Signed)
OUTPATIENT OCCUPATIONAL THERAPY TREATMENT NOTE  Patient Name: Lori Love MRN: [id number] DOB:October 09, 1960, 62 y.o., female Today's Date: 09/04/2022  PCP: Evelena Peat, MD REFERRING PROVIDER: Rodolph Bong, MD   END OF SESSION:  OT End of Session - 09/04/22 1147     Visit Number 3    Number of Visits 12    Date for OT Re-Evaluation 10/05/22    Authorization Type [employer] [facility] [facility]    OT Start Time 1147    OT Stop Time 1240    OT Time Calculation (min) 53 min    Activity Tolerance Patient tolerated treatment well;Patient limited by fatigue;Patient limited by pain    Behavior During Therapy [facility] for tasks assessed/performed               Past Medical History:  Diagnosis Date   Arthritis    thumbs   Hypertension    followed by pcp and cardiology    Neck pain, chronic    Palpitations    cardiologist-- dr Jens Som---  ETT 03-06-2018 in epic normal with no significant arrythmia;  echo 05-16-2017 normal ;  event monitor 05-16-2017 SR with PVCs   Seasonal allergies    Seasonal asthma    followd by pcp   Past Surgical History:  Procedure Laterality Date   APPENDECTOMY  1996   DILATATION & CURETTAGE/HYSTEROSCOPY WITH MYOSURE N/A 11/03/2019   Procedure: DILATATION & CURETTAGE/HYSTEROSCOPY;  Surgeon: Jerene Bears, MD;  Location: [facility] [place];  Service: Gynecology;  Laterality: N/A;   DILATION AND CURETTAGE OF UTERUS  2003   retained placenta post SVD   PLACEMENT OF BREAST IMPLANTS Bilateral 1998 approx.   TONSILLECTOMY AND ADENOIDECTOMY  child   Patient Active Problem List   Diagnosis Date Noted   Lateral epicondylitis of right elbow 03/03/2015   Right elbow pain 03/03/2015   Vitamin D deficiency 11/25/2009   Hypertension 04/06/2009   Asthma, intermittent with acute exacerbation 04/06/2009   Asthma 10/21/2008   Positive ANA (antinuclear antibody) 10/21/2008   Anxiety 04/17/2007   Palpitations 04/10/2007   Scoliosis 04/07/2007    ONSET DATE: Feb 2024  injury to Rt elbow (acute on Chronic, "years" of issues)  REFERRING DIAG: M25.521 (ICD-10-CM) - Right elbow painM77.11 (ICD-10-CM) - Lateral epicondylitis of right elbow  THERAPY DIAG:  Other lack of coordination  Localized edema  Muscle weakness (generalized)  Pain in right elbow  Rationale for Evaluation and Treatment: Rehabilitation  PERTINENT HISTORY: Per MD notes: "R elbow pain ongoing since Feb worsening last Thursday. She noticed the elbow pain soon after switching gyms to [employer]. She does play tennis regularly. She went to underhand serve and felt an explosion of pain. Pt locates pain to the medial and lateral aspects of the R elbow.  Her symptoms are much more significant on the lateral aspect of the elbow...right elbow pain thought to be predominantly due to a partial tear of the lateral epicondyle. This is effectively pretty significant lateral epicondylitis with a tear."  She states working out a lot, feeling some elbow pain, "gave it 2 weeks" then started a tennis season, elbow continued to hurt "gave it 2 more weeks."  She did a zip-lining which hurt elbow, and then more tennis caused a very bad pain a week ago- felt a big "pop" and a terrible pain.  She describes getting therapy in the past for this issue and essentially years of pain and problems without ever resting and obviously not managing it well.  She also describes medial elbow  pain and medial epicondylitis symptoms today.  Fortunately she has very little pain now but having high pain tolerance has been somewhat detrimental for her thus far.  PRECAUTIONS: None;  WEIGHT BEARING RESTRICTIONS: Yes recommended no lift/push/pull now more than 3-5# or anything painful   SUBJECTIVE:   SUBJECTIVE STATEMENT: She states she can tell when she does something that bothers her elbow now.  [name] more aware of what is going on.  She does feel like tightness in her shoulder is contributing towards posturing her body and her elbow  improperly      PAIN:  Are you having pain? Not at rest now just 2-3/10 this morning   Pain location: lateral and medial epicondyles of the Rt elbow (L > M)  Pain description: sharp at times, aching at rest  Aggravating factors: pushing, pulling, etc.  Relieving factors: heat, rest   PATIENT GOALS: To prevent further problems to the right elbow, decrease pain, increase strength and ability to eventually return to sports and activities.  Avoid surgery   OBJECTIVE: (All objective assessments below are from initial evaluation on: 08/23/22 unless otherwise specified.)   HAND DOMINANCE: Right   ADLs: Overall ADLs: States decreased ability to grab, hold household objects, pain and inability to open containers, perform FMS tasks (manipulate fasteners on clothing), mild to moderate bathing problems as well.    FUNCTIONAL OUTCOME MEASURES: Eval: Quck DASH 50% impairment today  (Higher % Score  =  More Impairment)      UPPER EXTREMITY ROM     Shoulder to Wrist AROM Right eval Rt / Lt 09/04/22   Shoulder flexion  180  /  180  Shoulder abduction    Shoulder extension    Shoulder internal rotation  32  (60* after Dry Needling)  /  48  Shoulder external rotation  90  /  90  Elbow flexion full   Elbow extension full   Forearm supination 83   Forearm pronation  85   Wrist flexion 76   Wrist extension 79   Wrist ulnar deviation    Wrist radial deviation    Functional dart thrower's motion (F-DTM) in ulnar flexion    F-DTM in radial extension     (Blank rows = not tested)   Hand AROM Right eval  Full Fist Ability (or Gap to Distal Palmar Crease) yes  Thumb Opposition  (Kapandji Scale)  full  (Blank rows = not tested)   UPPER EXTREMITY MMT:    Eval: measured approximately, carefully to not exacerbate tear   MMT Right 08/23/22  Shoulder flexion   Shoulder abduction   Shoulder adduction   Shoulder extension   Shoulder internal rotation   Shoulder external rotation   Middle  trapezius   Lower trapezius   Elbow flexion 4/5  Elbow extension 4-/5 pain  Forearm supination 4-/5 pain  Forearm pronation 4/5 tender  Wrist flexion 4+/5  Wrist extension 4-/5 pain  Wrist ulnar deviation   Wrist radial deviation   (Blank rows = not tested)  HAND FUNCTION: 09/04/22: Rt grip: 59#, 76# Lt  Eval: Observed weakness in affected Rt hand, details TBD.   COORDINATION: 09/04/22:  Box and Blocks Test: Rt 60 Blocks today (56 is WFL)   EDEMA:   Eval:  Swollen lump at lateral epicondyle today that she states is not overly tender    OBSERVATIONS:   08/30/22: swelling much improved around elbow today.   Eval: She has tenderness to the medial and lateral epicondyles of  the right elbow, but is surprisingly not very tender despite describing a tear last week.  She appears to have a high pain tolerance as she is routinely hurt herself but restarted high-level sports within 1 to 2 weeks afterwards.  Wrist extension and supination are more painful than pronation and wrist flexion today.   TODAY'S TREATMENT:  09/04/22: OT takes range of motion measures for the shoulder and indeed she has tightness in internal rotation in the right shoulder especially compared to the left.  She has trigger points noted in levator scapula, upper trap, middle trap, through the biceps and triceps still.  OT performs manual therapy percussion tool to the upper trap and mid trap which she states is somewhat relieving, with her expressed permission OT performs manual therapy modality dry needling with a 0.30 x 60mm needle inserted only partly into the right shoulder for the lateral deltoid and external rotator groups.  She has a small muscle twitch but does state some relief, no significant pain bruising or bleeding.  Her internal rotation improved significantly after this.  Additionally OT does trigger point release to tight tricep which helped somewhat with wrist flexion stretches.  All of her stretches were  reviewed from the neck down to her elbow and wrist for both lateral epicondyle and wrist extension for medial epicondyle.  Additionally OT has her perform contract relax techniques moving the scapula and protraction as well as retraction to help work on these muscle knots.  We reviewed the use of her tennis elbow strap which she did find at home and is working well.     PATIENT EDUCATION: Education details: See tx section above for details  Person educated: Patient Education method: Verbal Instruction, Teach back, Handouts  Education comprehension: States and demonstrates understanding, Additional Education required    HOME EXERCISE PROGRAM: Access Code: [email or web address] URL: [email or web address] Date: 08/30/2022 Prepared by: Fannie Knee   GOALS: Goals reviewed with patient? Yes   SHORT TERM GOALS: (STG required if POC>30 days) Target Date: 09/07/22  Pt will obtain protective, custom orthotic. Goal status:MET, but may require counter force brace as well   2.  Pt will demo/state understanding of initial HEP to improve pain levels and prerequisite motion. Goal status: INITIAL   LONG TERM GOALS: Target Date: 10/19/22  Pt will improve functional ability by decreased impairment per Quick DASH assessment from 50% to 20% or better, for better quality of life. Goal status: INITIAL  2.  Pt will improve grip strength in Rt hand from TBD to at least TBDlbs for functional use at home and in IADLs. Goal status: INITIAL- TBD baseline next session   3.  Pt will improve strength in Rt wrist flex/ext from 4-/5 painful MMT to at least 4+/5 MMT to have increased functional ability to carry out selfcare and higher-level homecare tasks with no difficulty. Goal status: INITIAL  5.  Pt will improve coordination skills in Rt arm, as seen by [facility], non-painful score on bo and blocks testing to have increased functional ability to carry out fine motor tasks (fasteners, etc.) and more complex,  coordinated IADLs (meal prep, sports, etc.).  Goal status: INITIAL- TBD baseline next session  6.  Pt will decrease pain at worst from 7-8/10 to 2-3/10, during reaching pushing/pulling, etc., to have better sleep and occupational participation in daily roles. Goal status: INITIAL   ASSESSMENT:  CLINICAL IMPRESSION: 09/04/22: She seems to be doing well and understanding her home exercise program and also avoiding exacerbation of pain to the  lateral elbow.  She carries a lot of her tightness in her upper trap/levator scapula and around rotator cuff muscles which comes down the side of her arm through the radial nerve pathway and into the lateral elbow.  We are working on this whole chain of tightness and also addressing the medial elbow.  Continue on    PLAN:  OT FREQUENCY: 2x/week  OT DURATION: 8 weeks  PLANNED INTERVENTIONS: self care/ADL training, therapeutic exercise, therapeutic activity, neuromuscular re-education, manual therapy, passive range of motion, splinting, electrical stimulation, ultrasound, iontophoresis, compression bandaging, moist heat, cryotherapy, patient/family education, coping strategies training, and Dry needling  CONSULTED AND AGREED WITH PLAN OF CARE: Patient  PLAN FOR NEXT SESSION:  Try to introduce isometric or eccentric strength, to arm/elbow, continue DN if tolerated.    Fannie Knee, OTR/L, CHT 09/04/2022, 12:49 PM

## 2022-09-04 ENCOUNTER — Ambulatory Visit: Payer: 59 | Admitting: Rehabilitative and Restorative Service Providers"

## 2022-09-04 ENCOUNTER — Encounter: Payer: Self-pay | Admitting: Rehabilitative and Restorative Service Providers"

## 2022-09-04 DIAGNOSIS — M25521 Pain in right elbow: Secondary | ICD-10-CM | POA: Diagnosis not present

## 2022-09-04 DIAGNOSIS — R600 Localized edema: Secondary | ICD-10-CM | POA: Diagnosis not present

## 2022-09-04 DIAGNOSIS — M6281 Muscle weakness (generalized): Secondary | ICD-10-CM | POA: Diagnosis not present

## 2022-09-04 DIAGNOSIS — R278 Other lack of coordination: Secondary | ICD-10-CM | POA: Diagnosis not present

## 2022-09-10 NOTE — Therapy (Signed)
OUTPATIENT OCCUPATIONAL THERAPY TREATMENT NOTE  Patient Name: Lori Love MRN: [id number] DOB:Sep 19, 1960, 62 y.o., female Today's Date: 09/11/2022  PCP: Evelena Peat, MD REFERRING PROVIDER: Rodolph Bong, MD   END OF SESSION:  OT End of Session - 09/11/22 1147     Visit Number 4    Number of Visits 12    Date for OT Re-Evaluation 10/05/22    Authorization Type [facility]    OT Start Time 1147    OT Stop Time 1244    OT Time Calculation (min) 57 min    Activity Tolerance Patient tolerated treatment well;Patient limited by fatigue;Patient limited by pain;No increased pain    Behavior During Therapy [facility] for tasks assessed/performed            +  Past Medical History:  Diagnosis Date   Arthritis    thumbs   Hypertension    followed by pcp and cardiology    Neck pain, chronic    Palpitations    cardiologist-- dr Jens Som---  ETT 03-06-2018 in epic normal with no significant arrythmia;  echo 05-16-2017 normal ;  event monitor 05-16-2017 SR with PVCs   Seasonal allergies    Seasonal asthma    followd by pcp   Past Surgical History:  Procedure Laterality Date   APPENDECTOMY  1996   DILATATION & CURETTAGE/HYSTEROSCOPY WITH MYOSURE N/A 11/03/2019   Procedure: DILATATION & CURETTAGE/HYSTEROSCOPY;  Surgeon: Jerene Bears, MD;  Location: [facility] [place];  Service: Gynecology;  Laterality: N/A;   DILATION AND CURETTAGE OF UTERUS  2003   retained placenta post SVD   PLACEMENT OF BREAST IMPLANTS Bilateral 1998 approx.   TONSILLECTOMY AND ADENOIDECTOMY  child   Patient Active Problem List   Diagnosis Date Noted   Lateral epicondylitis of right elbow 03/03/2015   Right elbow pain 03/03/2015   Vitamin D deficiency 11/25/2009   Hypertension 04/06/2009   Asthma, intermittent with acute exacerbation 04/06/2009   Asthma 10/21/2008   Positive ANA (antinuclear antibody) 10/21/2008   Anxiety 04/17/2007   Palpitations 04/10/2007   Scoliosis 04/07/2007    ONSET  DATE: Feb 2024 injury to Rt elbow (acute on Chronic, "years" of issues)  REFERRING DIAG: M25.521 (ICD-10-CM) - Right elbow painM77.11 (ICD-10-CM) - Lateral epicondylitis of right elbow  THERAPY DIAG:  Other lack of coordination  Localized edema  Muscle weakness (generalized)  Pain in right elbow  Rationale for Evaluation and Treatment: Rehabilitation  PERTINENT HISTORY: Per MD notes: "R elbow pain ongoing since Feb worsening last Thursday. She noticed the elbow pain soon after switching gyms to [employer]. She does play tennis regularly. She went to underhand serve and felt an explosion of pain. Pt locates pain to the medial and lateral aspects of the R elbow.  Her symptoms are much more significant on the lateral aspect of the elbow...right elbow pain thought to be predominantly due to a partial tear of the lateral epicondyle. This is effectively pretty significant lateral epicondylitis with a tear."  She states working out a lot, feeling some elbow pain, "gave it 2 weeks" then started a tennis season, elbow continued to hurt "gave it 2 more weeks."  She did a zip-lining which hurt elbow, and then more tennis caused a very bad pain a week ago- felt a big "pop" and a terrible pain.  She describes getting therapy in the past for this issue and essentially years of pain and problems without ever resting and obviously not managing it well.  She also describes medial  elbow pain and medial epicondylitis symptoms today.  Fortunately she has very little pain now but having high pain tolerance has been somewhat detrimental for her thus far.  PRECAUTIONS: None;  WEIGHT BEARING RESTRICTIONS: Yes recommended no lift/push/pull now more than 3-5# or anything painful   SUBJECTIVE:   SUBJECTIVE STATEMENT: She states using supportive orthotic to prevent pain during yoga class recently. No pain now.    PAIN:  Are you having pain?   Not at rest now Pain location: lateral and medial epicondyles of the Rt  elbow (L > M)  Pain description: sharp at times, aching at rest  Aggravating factors: pushing, pulling, etc.  Relieving factors: heat, rest   PATIENT GOALS: To prevent further problems to the right elbow, decrease pain, increase strength and ability to eventually return to sports and activities.  Avoid surgery   OBJECTIVE: (All objective assessments below are from initial evaluation on: 08/23/22 unless otherwise specified.)   HAND DOMINANCE: Right   ADLs: Overall ADLs: States decreased ability to grab, hold household objects, pain and inability to open containers, perform FMS tasks (manipulate fasteners on clothing), mild to moderate bathing problems as well.    FUNCTIONAL OUTCOME MEASURES: Eval: Quck DASH 50% impairment today  (Higher % Score  =  More Impairment)      UPPER EXTREMITY ROM     Shoulder to Wrist AROM Right eval Rt / Lt 09/04/22  Rt 09/11/22  Shoulder flexion  180  /  180   Shoulder abduction     Shoulder extension     Shoulder internal rotation  32  (60* after Dry Needling)  /  48 47*   Shoulder external rotation  90  /  90   Elbow flexion full    Elbow extension full    Forearm supination 83    Forearm pronation  85    Wrist flexion 76  78  Wrist extension 79  72  Wrist ulnar deviation     Wrist radial deviation     Functional dart thrower's motion (F-DTM) in ulnar flexion     F-DTM in radial extension      (Blank rows = not tested)   Hand AROM Right eval  Full Fist Ability (or Gap to Distal Palmar Crease) yes  Thumb Opposition  (Kapandji Scale)  full  (Blank rows = not tested)   UPPER EXTREMITY MMT:    Eval: measured approximately, carefully to not exacerbate tear   MMT Right 08/23/22  Shoulder flexion   Shoulder abduction   Shoulder adduction   Shoulder extension   Shoulder internal rotation   Shoulder external rotation   Middle trapezius   Lower trapezius   Elbow flexion 4/5  Elbow extension 4-/5 pain  Forearm supination 4-/5 pain   Forearm pronation 4/5 tender  Wrist flexion 4+/5  Wrist extension 4-/5 pain  Wrist ulnar deviation   Wrist radial deviation   (Blank rows = not tested)  HAND FUNCTION: 09/04/22: Rt grip: 59#, 76# Lt  Eval: Observed weakness in affected Rt hand, details TBD.   COORDINATION: 09/04/22:  Box and Blocks Test: Rt 60 Blocks today (56 is WFL)   EDEMA:   Eval:  Swollen lump at lateral epicondyle today that she states is not overly tender    OBSERVATIONS:   08/30/22: swelling much improved around elbow today.   Eval: She has tenderness to the medial and lateral epicondyles of the right elbow, but is surprisingly not very tender despite describing a tear last week.  She appears to have a high pain tolerance as she is routinely hurt herself but restarted high-level sports within 1 to 2 weeks afterwards.  Wrist extension and supination are more painful than pronation and wrist flexion today.   TODAY'S TREATMENT:  09/11/22: She starts with active range of motion of the shoulder which shows improvement now.  Wrist improvement is also seen somewhat today.  OT reviews her stretches and recommends continued stretches and self massages/warm ups before starting new activities that were educated on today.  These are bolded below and include very light isometric training at the wrist and forearm as well as the tricep and shoulder strengthening.  [name] encouraged to only use "30 to 40%" of her effort when doing these wrist isometrics to avoid exacerbation.  She trials these today and has no acute pain or increased issues.  [name] given red rubber band to perform shoulder external rotation exercises.  Additionally she discusses performing yoga and other exercise routines, which OT is fine with but for self-care recommends still wearing elbow strap or wrist orthosis and being cautious about elbow and wrist postures when performing.  She has stiff she will ever return to tennis and OT does encourage her that she  should but would recommend never applying again without some type of elbow support.  Due to continued tightness in the upper trap/levator Scap and infraspinatus, and with her expressed permission, OT performs manual therapy dry needling to right upper trap as well as right infraspinatus muscle groups.  OT uses a 0.30 x 60mm needle inserted only partly into these areas-getting an excellent twitch in the upper trap, but no significant twitch in the infraspinatus today.  She states feeling a bit looser at the end.  She will be on her own for 2 weeks before returning back to therapy so she was asked to progress through these exercises in a careful and progressive fashion.  She states understanding    Exercises - Standing neck/upper traps stretch  - 4-6 x daily - 3-5 reps - 15 sec hold - Sleeper Stretch  - 3-4 x daily - 5 reps - 15-20 sec hold - Tricep Stretch- DO SEATED BY TABLE  - 3-4 x daily - 3-5 reps - 15 hold - Seated Wrist Flexion Stretch  - 3-4 x daily - 3 reps - 15 second  hold - Wrist Prayer Stretch  - 4 x daily - 3-5 reps - 15 sec hold - Standing Radial Nerve Glide  - 4-6 x daily - 1 sets - 10-15 reps - Seated Isometric Wrist Ulnar Deviation with Manual Resistance  - 2-3 x daily - 5 reps - 5-10 hold - Seated Isometric Wrist Radial Deviation with Manual Resistance  - 2-3 x daily - 3-5 reps - 5-10 hold - Seated Isometric Wrist Flexion Neutral  - 2-3 x daily - 3-5 reps - 5-10 hold - Seated Isometric Wrist Extension  - 2-3 x daily - 3-5 reps - 5-10 hold - Shoulder External Rotation with Anchored Resistance  - 2-3 x daily - 10-15 reps - 2-3 sec hold    PATIENT EDUCATION: Education details: See tx section above for details  Person educated: Patient Education method: Verbal Instruction, Teach back, Handouts  Education comprehension: States and demonstrates understanding, Additional Education required    HOME EXERCISE PROGRAM: Access Code: [email or web address] URL:  [email or web address] Date: 08/30/2022 Prepared by: Fannie Knee   GOALS: Goals reviewed with patient? Yes   SHORT TERM GOALS: (STG required if POC>30 days) Target  Date: 09/07/22  Pt will obtain protective, custom orthotic. Goal status:MET, but may require counter force brace as well   2.  Pt will demo/state understanding of initial HEP to improve pain levels and prerequisite motion. Goal status: INITIAL   LONG TERM GOALS: Target Date: 10/19/22  Pt will improve functional ability by decreased impairment per Quick DASH assessment from 50% to 20% or better, for better quality of life. Goal status: INITIAL  2.  Pt will improve grip strength in Rt hand from TBD to at least TBDlbs for functional use at home and in IADLs. Goal status: INITIAL- TBD baseline next session   3.  Pt will improve strength in Rt wrist flex/ext from 4-/5 painful MMT to at least 4+/5 MMT to have increased functional ability to carry out selfcare and higher-level homecare tasks with no difficulty. Goal status: INITIAL  5.  Pt will improve coordination skills in Rt arm, as seen by [facility], non-painful score on bo and blocks testing to have increased functional ability to carry out fine motor tasks (fasteners, etc.) and more complex, coordinated IADLs (meal prep, sports, etc.).  Goal status: INITIAL- TBD baseline next session  6.  Pt will decrease pain at worst from 7-8/10 to 2-3/10, during reaching pushing/pulling, etc., to have better sleep and occupational participation in daily roles. Goal status: INITIAL   ASSESSMENT:  CLINICAL IMPRESSION: 09/11/22: She does feel the upper neck and shoulder tightness has contributed to her elbow pain and problems, she does continue to focus on stretches there and has been having some relief.  [name] keeping her pain low now just needs to continue to be cautious as she progresses to light strengthening.  09/04/22: She seems to be doing well and understanding her  home exercise program and also avoiding exacerbation of pain to the lateral elbow.  She carries a lot of her tightness in her upper trap/levator scapula and around rotator cuff muscles which comes down the side of her arm through the radial nerve pathway and into the lateral elbow.  We are working on this whole chain of tightness and also addressing the medial elbow.  Continue on    PLAN:  OT FREQUENCY: 2x/week  OT DURATION: 8 weeks  PLANNED INTERVENTIONS: self care/ADL training, therapeutic exercise, therapeutic activity, neuromuscular re-education, manual therapy, passive range of motion, splinting, electrical stimulation, ultrasound, iontophoresis, compression bandaging, moist heat, cryotherapy, patient/family education, coping strategies training, and Dry needling  CONSULTED AND AGREED WITH PLAN OF CARE: Patient  PLAN FOR NEXT SESSION:  She will return in about 2 weeks for check of her status, motion, strength and tolerance to increasing to eccentric or possibly concentric exercises.  Emphasis should remain on using supportive bracing to prevent exacerbation when playing high-level sports, though this will not be recommended for probably another month.   Fannie Knee, OTR/L, CHT 09/11/2022, 1:07 PM

## 2022-09-11 ENCOUNTER — Ambulatory Visit: Payer: 59 | Admitting: Rehabilitative and Restorative Service Providers"

## 2022-09-11 ENCOUNTER — Encounter: Payer: Self-pay | Admitting: Rehabilitative and Restorative Service Providers"

## 2022-09-11 DIAGNOSIS — M6281 Muscle weakness (generalized): Secondary | ICD-10-CM

## 2022-09-11 DIAGNOSIS — R600 Localized edema: Secondary | ICD-10-CM

## 2022-09-11 DIAGNOSIS — R278 Other lack of coordination: Secondary | ICD-10-CM | POA: Diagnosis not present

## 2022-09-11 DIAGNOSIS — M25521 Pain in right elbow: Secondary | ICD-10-CM | POA: Diagnosis not present

## 2022-09-18 ENCOUNTER — Ambulatory Visit: Payer: 59 | Admitting: Family Medicine

## 2022-10-02 ENCOUNTER — Other Ambulatory Visit: Payer: Self-pay | Admitting: Cardiology

## 2022-10-02 DIAGNOSIS — I10 Essential (primary) hypertension: Secondary | ICD-10-CM

## 2022-10-23 ENCOUNTER — Ambulatory Visit: Payer: 59 | Admitting: Rehabilitative and Restorative Service Providers"

## 2022-10-23 ENCOUNTER — Encounter: Payer: Self-pay | Admitting: Rehabilitative and Restorative Service Providers"

## 2022-10-23 DIAGNOSIS — R278 Other lack of coordination: Secondary | ICD-10-CM

## 2022-10-23 DIAGNOSIS — R600 Localized edema: Secondary | ICD-10-CM

## 2022-10-23 DIAGNOSIS — M25521 Pain in right elbow: Secondary | ICD-10-CM | POA: Diagnosis not present

## 2022-10-23 DIAGNOSIS — M6281 Muscle weakness (generalized): Secondary | ICD-10-CM

## 2022-10-23 NOTE — Therapy (Signed)
OUTPATIENT OCCUPATIONAL THERAPY TREATMENT & DISCHARGE  NOTE  Patient Name: Lori Love MRN: [id number] DOB:Jul 17, 1960, 62 y.o., female Today's Date: 10/23/2022  PCP: Evelena Peat, MD REFERRING PROVIDER: Rodolph Bong, MD        OCCUPATIONAL THERAPY DISCHARGE SUMMARY  Visits from Start of Care: 5  10/23/22: Although she has not been attending therapy the past 5 to 6 weeks, she is continue to do home exercises and OT recommendations.  She now has met all goals, has no significant pain or weakness in the right arm or elbow, she has a plan to do 1 month training before returning to sports that consists of progressive resistive exercises for the tricep, shoulder, whole arm training.  She states understanding this and feeling comfortable doing it on her own.  She was advised to not go "whole hog" and cause an exacerbation by doing too painful, heavy, repetitive things to start with.  [name] happy with her progress and agrees to discharge today.  Also requesting additional visit to cover today's last visit.:   OT FREQUENCY: 1 additional visit  OT DURATION: additional 1 visit from 09/11/22 - 10/23/22 to cover today's last visit   Current functional level related to goals / functional outcomes: Pt has met all goals to satisfactory levels and is pleased with outcomes.   Remaining deficits: Pt has no more significant functional deficits or pain.   Education / Equipment: Pt has all needed materials and education. Pt understands how to continue on with self-management. See tx notes for more details.   Patient agrees to discharge due to max benefits received from outpatient occupational therapy / hand therapy at this time.   Fannie Knee, OTR/L, CHT 10/23/22        END OF SESSION:  OT End of Session - 10/23/22 1436     Visit Number 5    Number of Visits 12    Date for OT Re-Evaluation 10/05/22    Authorization Type [facility]    OT Start Time 1357    OT Stop Time 1437     OT Time Calculation (min) 40 min    Activity Tolerance Patient tolerated treatment well;No increased pain;Patient limited by fatigue    Behavior During Therapy [facility] for tasks assessed/performed             +  Past Medical History:  Diagnosis Date   Arthritis    thumbs   Hypertension    followed by pcp and cardiology    Neck pain, chronic    Palpitations    cardiologist-- dr Jens Som---  ETT 03-06-2018 in epic normal with no significant arrythmia;  echo 05-16-2017 normal ;  event monitor 05-16-2017 SR with PVCs   Seasonal allergies    Seasonal asthma    followd by pcp   Past Surgical History:  Procedure Laterality Date   APPENDECTOMY  1996   DILATATION & CURETTAGE/HYSTEROSCOPY WITH MYOSURE N/A 11/03/2019   Procedure: DILATATION & CURETTAGE/HYSTEROSCOPY;  Surgeon: Jerene Bears, MD;  Location: [facility] [place];  Service: Gynecology;  Laterality: N/A;   DILATION AND CURETTAGE OF UTERUS  2003   retained placenta post SVD   PLACEMENT OF BREAST IMPLANTS Bilateral 1998 approx.   TONSILLECTOMY AND ADENOIDECTOMY  child   Patient Active Problem List   Diagnosis Date Noted   Lateral epicondylitis of right elbow 03/03/2015   Right elbow pain 03/03/2015   Vitamin D deficiency 11/25/2009   Hypertension 04/06/2009   Asthma, intermittent with acute exacerbation 04/06/2009  Asthma 10/21/2008   Positive ANA (antinuclear antibody) 10/21/2008   Anxiety 04/17/2007   Palpitations 04/10/2007   Scoliosis 04/07/2007    ONSET DATE: Feb 2024 injury to Rt elbow (acute on Chronic, "years" of issues)  REFERRING DIAG: M25.521 (ICD-10-CM) - Right elbow painM77.11 (ICD-10-CM) - Lateral epicondylitis of right elbow  THERAPY DIAG:  Other lack of coordination  Muscle weakness (generalized)  Pain in right elbow  Localized edema  Rationale for Evaluation and Treatment: Rehabilitation  PERTINENT HISTORY: Per MD notes: "R elbow pain ongoing since Feb worsening last Thursday. She  noticed the elbow pain soon after switching gyms to [employer]. She does play tennis regularly. She went to underhand serve and felt an explosion of pain. Pt locates pain to the medial and lateral aspects of the R elbow.  Her symptoms are much more significant on the lateral aspect of the elbow...right elbow pain thought to be predominantly due to a partial tear of the lateral epicondyle. This is effectively pretty significant lateral epicondylitis with a tear."  She states working out a lot, feeling some elbow pain, "gave it 2 weeks" then started a tennis season, elbow continued to hurt "gave it 2 more weeks."  She did a zip-lining which hurt elbow, and then more tennis caused a very bad pain a week ago- felt a big "pop" and a terrible pain.  She describes getting therapy in the past for this issue and essentially years of pain and problems without ever resting and obviously not managing it well.  She also describes medial elbow pain and medial epicondylitis symptoms today.  Fortunately she has very little pain now but having high pain tolerance has been somewhat detrimental for her thus far.  PRECAUTIONS: None;  WEIGHT BEARING RESTRICTIONS: Yes recommended no lift/push/pull now more than 3-5# or anything painful   SUBJECTIVE:   SUBJECTIVE STATEMENT: She states mopping and using her arm without pain in past few weeks.  She has been avoiding lifting, tennis, sports, etc.    PAIN:  Are you having pain?   Not at rest now or in the past week    PATIENT GOALS: To prevent further problems to the right elbow, decrease pain, increase strength and ability to eventually return to sports and activities.  Avoid surgery   OBJECTIVE: (All objective assessments below are from initial evaluation on: 08/23/22 unless otherwise specified.)   HAND DOMINANCE: Right   ADLs: Overall ADLs:  States no issues with I/ADLs other than very mild tenderness after activities    FUNCTIONAL OUTCOME MEASURES: 10/23/22: Quick  DASH 9% impairment today  (Higher % Score  =  More Impairment)    Eval: Quick DASH 50% impairment today  (Higher % Score  =  More Impairment)      UPPER EXTREMITY ROM     Shoulder to Wrist AROM Right eval Rt / Lt 09/04/22  Rt 09/11/22 Rt 10/23/22  Shoulder flexion  180  /  180    Shoulder abduction      Shoulder extension      Shoulder internal rotation  32  (60* after Dry Needling)  /  48 47*  52  Shoulder external rotation  90  /  90    Elbow flexion full   150  Elbow extension full   (+10)  Forearm supination 83   90  Forearm pronation  85   80  Wrist flexion 76  78 74  Wrist extension 79  72 84  (Blank rows = not tested)  Hand AROM Right eval  Full Fist Ability (or Gap to Distal Palmar Crease) yes  Thumb Opposition  (Kapandji Scale)  full  (Blank rows = not tested)   UPPER EXTREMITY MMT:    Eval: measured approximately, carefully to not exacerbate tear   MMT Right 08/23/22 Rt 10/23/22  Shoulder flexion    Shoulder abduction    Shoulder adduction    Shoulder extension    Shoulder internal rotation  5/5  Shoulder external rotation  4+/5  Middle trapezius    Lower trapezius    Elbow flexion 4/5 5/5  Elbow extension 4-/5 pain 4+/5 shake  Forearm supination 4-/5 pain 5/5  Forearm pronation 4/5 tender 5/5  Wrist flexion 4+/5 4+/5  Wrist extension 4-/5 pain 4+/5  Wrist ulnar deviation    Wrist radial deviation    (Blank rows = not tested)  HAND FUNCTION: 10/23/22: 76# Rt grip, no pain  09/04/22: Rt grip: 59#, 76# Lt  Eval: Observed weakness in affected Rt hand, details TBD.   COORDINATION: 09/04/22:  Box and Blocks Test: Rt 60 Blocks today (56 is WFL)   EDEMA:   10/23/22: none   Eval:  Swollen lump at lateral epicondyle today that she states is not overly tender    OBSERVATIONS:   10/23/22: No tenderness about the right lateral epicondyle now or through the muscle bellies, no significant muscle spasms noted from the shoulder down to the hand.  No soreness with  resisted wrist extension or with tricep push.  Just some mild weakness to now workout which she should be able to handle on her own without causing exacerbation, and less she is unsafe and unwise.  08/30/22: swelling much improved around elbow today.   Eval: She has tenderness to the medial and lateral epicondyles of the right elbow, but is surprisingly not very tender despite describing a tear last week.  She appears to have a high pain tolerance as she is routinely hurt herself but restarted high-level sports within 1 to 2 weeks afterwards.  Wrist extension and supination are more painful than pronation and wrist flexion today.   TODAY'S TREATMENT:  10/23/22:  She returns to therapy after about 6 weeks of not being seen.  She performs AROM and strengthening exercises as well as functional activities for a check of progress (see above assessment for details). She discusses I/ADLs and her current functional status in relation to therapy goals and current status. OT reviews her therapy goals with her and she does meet all of her goals today.  OT does review her whole home exercise program with her-the importance of keeping stretches going and doing them as needed even once returning back to sports.  She was recommended to not return to sports until finishing 1 month of additional strengthening which she was described today to include tricep, external rotation of the shoulder, bicep, shoulder, and wrist extension strengthening.  We discussed how to do these with gym equipment or light hand weights.  She trials wrist extension with 2 pounds and has no issues doing multiple sets of 10 reps.  For her self-care/safety she was also reminded and recommended to not lift too much or too repetitively, to wear braces to the elbow or possibly Kinesiotape when playing sports and lifting weights to help reduce stress to the lateral epicondyle.  She was cautioned that if she does not continue stretches and exercises, and does  not carefully get back into full activities-then she will likely have an exacerbation of pain.  She states understanding and agrees to discharge to her self-care today.   OT Final Recommendations  on 10/23/2022 Start "return to sports" program.  1 month of training before you start tennis.   Heat up first, ice down at end if sore, stretch throughout! (don't stop your "good stretches" (tricep, sleeper, wrist flexion with elbow straight) for a few months.  Try pickleball before tennis, maybe  Think "1/2 the weight and  reps" for now.  Biceps- go for it  Back/shoulders- no over head, but to 90* in front and sides Shoulder- EXTERNAL ROTATION -Do it  Triceps- slowly get into these (dips, chair push, machine you like)  Wrist- light hand weights in flexion and extension After a month of training, and no issues, get back into tennis but WEAR an elbow sleeve or tennis elbow strap or K-tape.  Things that would be bad:  FAST motion without warmup, heavy weight for low reps, stopping stretching, over doing home activities, combo of these things    PATIENT EDUCATION: Education details: See tx section above for details  Person educated: Patient Education method: Verbal Instruction, Teach back, Handouts  Education comprehension: States and demonstrates understanding   HOME EXERCISE PROGRAM: Access Code: [email or web address] URL: [email or web address]    GOALS: Goals reviewed with patient? Yes   SHORT TERM GOALS: (STG required if POC>30 days) Target Date: 09/07/22  Pt will obtain protective, custom orthotic. Goal status:MET, but may require counter force brace as well   2.  Pt will demo/state understanding of initial HEP to improve pain levels and prerequisite motion. Goal status: 10/23/22: MET   LONG TERM GOALS: Target Date: 10/19/22  Pt will improve functional ability by decreased impairment per Quick DASH assessment from 50% to 20% or better, for better quality of life. Goal status:  10/23/22: MET 9 %  2.  Pt will improve grip strength in Rt hand from TBD to at least TBDlbs for functional use at home and in IADLs. Goal status: 10/23/22: MET equal to Lt no pain  3.  Pt will improve strength in Rt wrist flex/ext from 4-/5 painful MMT to at least 4+/5 MMT to have increased functional ability to carry out selfcare and higher-level homecare tasks with no difficulty. Goal status: 10/23/22: MET  5.  Pt will improve coordination skills in Rt arm, as seen by [facility], non-painful score on bo and blocks testing to have increased functional ability to carry out fine motor tasks (fasteners, etc.) and more complex, coordinated IADLs (meal prep, sports, etc.).  Goal status: 10/23/22: MET formerly  6.  Pt will decrease pain at worst from 7-8/10 to 2-3/10, during reaching pushing/pulling, etc., to have better sleep and occupational participation in daily roles. Goal status: 10/23/22: MET none in past week    ASSESSMENT:  CLINICAL IMPRESSION: 10/23/22: Although she has not been attending therapy the past 5 to 6 weeks, she is continue to do home exercises and OT recommendations.  She now has met all goals, has no significant pain or weakness in the right arm or elbow, she has a plan to do 1 month training before returning to sports that consists of progressive resistive exercises for the tricep, shoulder, whole arm training.  She states understanding this and feeling comfortable doing it on her own.  She was advised to not go "whole hog" and cause an exacerbation by doing too painful, heavy, repetitive things to start with.  [name] happy with her progress and agrees to discharge today.   PLAN:  OT FREQUENCY:  1 addiitonal visit  OT DURATION: additional 1 visit from 09/11/22 - 10/23/22 to cover today's last visit  PLANNED INTERVENTIONS: self care/ADL training, therapeutic exercise, therapeutic activity, neuromuscular re-education, manual therapy, passive range of motion, splinting, electrical stimulation,  ultrasound, iontophoresis, compression bandaging, moist heat, cryotherapy, patient/family education, coping strategies training, and Dry needling  CONSULTED AND AGREED WITH PLAN OF CARE: Patient  PLAN FOR NEXT SESSION:  D/C now    Fannie Knee, OTR/L, CHT 10/23/2022, 5:35 PM

## 2022-11-05 ENCOUNTER — Ambulatory Visit (HOSPITAL_BASED_OUTPATIENT_CLINIC_OR_DEPARTMENT_OTHER): Payer: 59 | Admitting: Obstetrics & Gynecology

## 2022-11-05 ENCOUNTER — Encounter (HOSPITAL_BASED_OUTPATIENT_CLINIC_OR_DEPARTMENT_OTHER): Payer: Self-pay | Admitting: Obstetrics & Gynecology

## 2022-11-05 VITALS — BP 135/89 | HR 65 | Ht 65.0 in | Wt 158.6 lb

## 2022-11-05 DIAGNOSIS — B009 Herpesviral infection, unspecified: Secondary | ICD-10-CM

## 2022-11-05 DIAGNOSIS — Z01419 Encounter for gynecological examination (general) (routine) without abnormal findings: Secondary | ICD-10-CM

## 2022-11-05 DIAGNOSIS — Z87440 Personal history of urinary (tract) infections: Secondary | ICD-10-CM | POA: Diagnosis not present

## 2022-11-05 DIAGNOSIS — G4709 Other insomnia: Secondary | ICD-10-CM | POA: Diagnosis not present

## 2022-11-05 DIAGNOSIS — N951 Menopausal and female climacteric states: Secondary | ICD-10-CM

## 2022-11-05 DIAGNOSIS — Z79890 Hormone replacement therapy (postmenopausal): Secondary | ICD-10-CM

## 2022-11-05 DIAGNOSIS — E785 Hyperlipidemia, unspecified: Secondary | ICD-10-CM

## 2022-11-05 DIAGNOSIS — Z780 Asymptomatic menopausal state: Secondary | ICD-10-CM

## 2022-11-05 MED ORDER — NITROFURANTOIN MONOHYD MACRO 100 MG PO CAPS
100.0000 mg | ORAL_CAPSULE | Freq: Two times a day (BID) | ORAL | 0 refills | Status: DC
Start: 2022-11-05 — End: 2023-11-27

## 2022-11-05 MED ORDER — VALACYCLOVIR HCL 500 MG PO TABS: 500.0000 mg | ORAL_TABLET | Freq: Every day | ORAL | 0 refills | Status: DC

## 2022-11-05 MED ORDER — ESTRADIOL-NORETHINDRONE ACET 0.5-0.1 MG PO TABS: 1.0000 | ORAL_TABLET | Freq: Every day | ORAL | 3 refills | Status: DC

## 2022-11-05 MED ORDER — ALPRAZOLAM 0.5 MG PO TABS
0.5000 mg | ORAL_TABLET | ORAL | 0 refills | Status: DC | PRN
Start: 2022-11-05 — End: 2023-04-16

## 2022-11-05 NOTE — Progress Notes (Signed)
62 y.o. [id number] Married White or Caucasian female here for annual exam.  Had some issues with tennis/golfer's elbow.  Had PT and this has really helped.  Has big trip scheduled.  [name] looking forward to this.  Doing [place] cruise line.    Denies vaginal bleeding.    Had partially treated UTI earlier this year.  Symptoms did finally resolve.  Was initially treated with another provider.  No new symptoms.  Would like rx for upcoming trip.  Doing well with HRT.  Does not want to make any changes.    Uses alprazolam rarely but would like updated rx for upcoming trip as well.  Patient's last menstrual period was 09/14/2019 (exact date).          Sexually active: Yes.    The current method of family planning is post menopausal status.    Exercising: yes Smoker:  no  Health Maintenance: Pap:  09/22/2020 History of abnormal Pap:  no MMG:  07/25/2022 Colonoscopy:  2022 BMD:   11/23/2021 Screening Labs: 02/2022   reports that she has never smoked. She has never used smokeless tobacco. She reports current alcohol use of about 7.0 - 14.0 standard drinks of alcohol per week. She reports that she does not use drugs.  Past Medical History:  Diagnosis Date   Arthritis    thumbs   Hypertension    followed by pcp and cardiology    Neck pain, chronic    Palpitations    cardiologist-- dr Jens Som---  ETT 03-06-2018 in epic normal with no significant arrythmia;  echo 05-16-2017 normal ;  event monitor 05-16-2017 SR with PVCs   Seasonal allergies    Seasonal asthma    followd by pcp    Past Surgical History:  Procedure Laterality Date   APPENDECTOMY  1996   DILATATION & CURETTAGE/HYSTEROSCOPY WITH MYOSURE N/A 11/03/2019   Procedure: DILATATION & CURETTAGE/HYSTEROSCOPY;  Surgeon: Jerene Bears, MD;  Location: [facility] [place];  Service: Gynecology;  Laterality: N/A;   DILATION AND CURETTAGE OF UTERUS  2003   retained placenta post SVD   PLACEMENT OF BREAST IMPLANTS Bilateral 1998  approx.   TONSILLECTOMY AND ADENOIDECTOMY  child    Current Outpatient Medications  Medication Sig Dispense Refill   ALPRAZolam (XANAX) 0.5 MG tablet Take 1 tablet (0.5 mg total) by mouth as needed for anxiety. 30 tablet 0   cetirizine (ZYRTEC) 10 MG tablet Take 10 mg by mouth daily.     estradiol (ESTRACE) 0.1 MG/GM vaginal cream 1 gram vaginally twice weekly 42.5 g 1   Estradiol-Norethindrone Acet 0.5-0.1 MG tablet TAKE 1 TABLET BY MOUTH EVERY DAY 84 tablet 1   hydrALAZINE (APRESOLINE) 25 MG tablet Take 25 mg twice a day if needed for systolic B/P 160 or greater 30 tablet 3   ibuprofen (ADVIL,MOTRIN) 200 MG tablet Take 200 mg by mouth every 6 (six) hours as needed for pain.      levalbuterol (XOPENEX HFA) 45 MCG/ACT inhaler Inhale 1-2 puffs into the lungs every 6 (six) hours as needed for wheezing. 1 each 1   metoprolol succinate (TOPROL-XL) 25 MG 24 hr tablet TAKE 1 TABLET BY MOUTH EVERY DAY 90 tablet 3   Multiple Vitamin (MULTIVITAMIN) tablet Take 1 tablet by mouth daily.     nitrofurantoin, macrocrystal-monohydrate, (MACROBID) 100 MG capsule Take 1 capsule (100 mg total) by mouth 2 (two) times daily. 10 capsule 0   triamcinolone (NASACORT) 55 MCG/ACT nasal inhaler Place 2 sprays into the nose daily  as needed.      valsartan (DIOVAN) 80 MG tablet TAKE 1 TABLET BY MOUTH EVERY DAY 90 tablet 1   No current facility-administered medications for this visit.    Family History  Problem Relation Age of Onset   Hypertension Mother    Stroke Mother    Hypertension Father    Dementia Father     ROS: Constitutional: negative Genitourinary:negative  Exam:   BP 135/89 (BP Location: Left Arm, Patient Position: Sitting, Cuff Size: Normal)   Pulse 65   Ht 5\' 5"  (1.651 m)   Wt 158 lb 9.6 oz (71.9 kg)   LMP 09/14/2019 (Exact Date)   BMI 26.39 kg/m   Height: 5\' 5"  (165.1 cm)  General appearance: alert, cooperative and appears stated age Head: Normocephalic, without obvious abnormality,  atraumatic Neck: no adenopathy, supple, symmetrical, trachea midline and thyroid normal to inspection and palpation Lungs: clear to auscultation bilaterally Breasts: normal appearance, no masses or tenderness Heart: regular rate and rhythm Abdomen: soft, non-tender; bowel sounds normal; no masses,  no organomegaly Extremities: extremities normal, atraumatic, no cyanosis or edema Skin: Skin color, texture, turgor normal. No rashes or lesions Lymph nodes: Cervical, supraclavicular, and axillary nodes normal. No abnormal inguinal nodes palpated Neurologic: Grossly normal   Pelvic: External genitalia:  no lesions              Urethra:  normal appearing urethra with no masses, tenderness or lesions              Bartholins and Skenes: normal                 Vagina: normal appearing vagina with normal color and no discharge, no lesions              Cervix: no lesions              Pap taken: No. Bimanual Exam:  Uterus:  normal size, contour, position, consistency, mobility, non-tender              Adnexa: normal adnexa and no mass, fullness, tenderness               Rectovaginal: Confirms               Anus:  normal sphincter tone, no lesions  Chaperone, Ina Homes, CMA, was present for exam.  Assessment/Plan: 1. Well woman exam with routine gynecological exam - Pap smear 09/22/2020 with neg HR HPV.  Will repeat next year. - Mammogram 07/25/2022 - Colonoscopy 2022 - Bone mineral density 11/23/2021 - lab work done with PCP, Dr. Caryl Never - vaccines reviewed/updated  2. History of UTI - rx given to pt for upcoming trip - nitrofurantoin, macrocrystal-monohydrate, (MACROBID) 100 MG capsule; Take 1 capsule (100 mg total) by mouth 2 (two) times daily. Take if having symptoms of a UTI.  Dispense: 10 capsule; Refill: 0  3. Hormone replacement therapy (HRT) - doing well.  Will make no changes. - Estradiol-Norethindrone Acet 0.5-0.1 MG tablet; Take 1 tablet by mouth daily.  Dispense: 84 tablet;  Refill: 3  4. Other insomnia - ALPRAZolam (XANAX) 0.5 MG tablet; Take 1 tablet (0.5 mg total) by mouth as needed for anxiety.  Dispense: 30 tablet; Refill: 0  5. HSV infection - valACYclovir (VALTREX) 500 MG tablet; Take 1 tablet (500 mg total) by mouth daily. As needed  Dispense: 30 tablet; Refill: 0  6. Elevated lipids - pt will return for this after her trip - Lipid panel; Future

## 2022-12-05 NOTE — Progress Notes (Signed)
HPI: FU palpitations. Echo 2/19 showed normal LV function. Event monitor 2/19 showed sinus with PVCs.  Exercise treadmill December 2019 showed a hypertensive response, no significant arrhythmias and no diagnostic ST changes.  Calcium score May 2023 0.  Since last seen, the patient denies any dyspnea on exertion, orthopnea, PND, pedal edema, palpitations, syncope or chest pain.   Current Outpatient Medications  Medication Sig Dispense Refill   ALPRAZolam (XANAX) 0.5 MG tablet Take 1 tablet (0.5 mg total) by mouth as needed for anxiety. 30 tablet 0   cetirizine (ZYRTEC) 10 MG tablet Take 10 mg by mouth daily.     estradiol (ESTRACE) 0.1 MG/GM vaginal cream 1 gram vaginally twice weekly 42.5 g 1   Estradiol-Norethindrone Acet 0.5-0.1 MG tablet Take 1 tablet by mouth daily. 84 tablet 3   hydrALAZINE (APRESOLINE) 25 MG tablet Take 25 mg twice a day if needed for systolic B/P 160 or greater 30 tablet 3   ibuprofen (ADVIL,MOTRIN) 200 MG tablet Take 200 mg by mouth every 6 (six) hours as needed for pain.      levalbuterol (XOPENEX HFA) 45 MCG/ACT inhaler Inhale 1-2 puffs into the lungs every 6 (six) hours as needed for wheezing. 1 each 1   metoprolol succinate (TOPROL-XL) 25 MG 24 hr tablet TAKE 1 TABLET BY MOUTH EVERY DAY 90 tablet 3   Multiple Vitamin (MULTIVITAMIN) tablet Take 1 tablet by mouth daily.     nitrofurantoin, macrocrystal-monohydrate, (MACROBID) 100 MG capsule Take 1 capsule (100 mg total) by mouth 2 (two) times daily. Take if having symptoms of a UTI. 10 capsule 0   triamcinolone (NASACORT) 55 MCG/ACT nasal inhaler Place 2 sprays into the nose daily as needed.      valACYclovir (VALTREX) 500 MG tablet Take 1 tablet (500 mg total) by mouth daily. As needed 30 tablet 0   valsartan (DIOVAN) 80 MG tablet TAKE 1 TABLET BY MOUTH EVERY DAY 90 tablet 1   No current facility-administered medications for this visit.     Past Medical History:  Diagnosis Date   Arthritis    thumbs    Hypertension    followed by pcp and cardiology    Neck pain, chronic    Palpitations    cardiologist-- dr Jens Som---  ETT 03-06-2018 in epic normal with no significant arrythmia;  echo 05-16-2017 normal ;  event monitor 05-16-2017 SR with PVCs   Seasonal allergies    Seasonal asthma    followd by pcp    Past Surgical History:  Procedure Laterality Date   APPENDECTOMY  1996   DILATATION & CURETTAGE/HYSTEROSCOPY WITH MYOSURE N/A 11/03/2019   Procedure: DILATATION & CURETTAGE/HYSTEROSCOPY;  Surgeon: Jerene Bears, MD;  Location: [facility];  Service: Gynecology;  Laterality: N/A;   DILATION AND CURETTAGE OF UTERUS  2003   retained placenta post SVD   PLACEMENT OF BREAST IMPLANTS Bilateral 1998 approx.   TONSILLECTOMY AND ADENOIDECTOMY  child    Social History   Socioeconomic History   Marital status: Married    Spouse name: Not on file   Number of children: 3   Years of education: Not on file   Highest education level: Not on file  Occupational History    Comment: Realtor  Tobacco Use   Smoking status: Never   Smokeless tobacco: Never  Vaping Use   Vaping status: Never Used  Substance and Sexual Activity   Alcohol use: Yes    Alcohol/week: 7.0 - 14.0 standard drinks of alcohol  Types: 7 - 14 Glasses of wine per week   Drug use: Never   Sexual activity: Yes    Birth control/protection: Post-menopausal    Comment: vasectomy   Other Topics Concern   Not on file  Social History Narrative   Not on file   Social Determinants of Health   Financial Resource Strain: Not on file  Food Insecurity: Not on file  Transportation Needs: Not on file  Physical Activity: Not on file  Stress: Not on file  Social Connections: Not on file  Intimate Partner Violence: Not on file    Family History  Problem Relation Age of Onset   Hypertension Mother    Stroke Mother    Hypertension Father    Dementia Father     ROS: no fevers or chills, productive cough,  hemoptysis, dysphasia, odynophagia, melena, hematochezia, dysuria, hematuria, rash, seizure activity, orthopnea, PND, pedal edema, claudication. Remaining systems are negative.  Physical Exam: Well-developed well-nourished in no acute distress.  Skin is warm and dry.  HEENT is normal.  Neck is supple.  Chest is clear to auscultation with normal expansion.  Cardiovascular exam is regular rate and rhythm.  Abdominal exam nontender or distended. No masses palpated. Extremities show no edema. neuro grossly intact  EKG Interpretation Date/Time:  Wednesday December 19 2022 10:23:48 EDT Ventricular Rate:  67 PR Interval:  142 QRS Duration:  80 QT Interval:  420 QTC Calculation: 443 R Axis:   94  Text Interpretation: Normal sinus rhythm Rightward axis No previous ECGs available Confirmed by Olga Millers ([id number]) on 12/19/2022 10:28:41 AM    A/P  1 palpitations-symptoms are controlled.  Continue beta-blocker at present dose.  2 hypertension-patient's blood pressure is controlled.  Continue present medical regimen.  Olga Millers, MD

## 2022-12-19 ENCOUNTER — Ambulatory Visit: Payer: 59 | Attending: Cardiology | Admitting: Cardiology

## 2022-12-19 ENCOUNTER — Encounter: Payer: Self-pay | Admitting: Cardiology

## 2022-12-19 VITALS — BP 120/62 | HR 67 | Ht 65.0 in | Wt 159.0 lb

## 2022-12-19 DIAGNOSIS — R002 Palpitations: Secondary | ICD-10-CM

## 2022-12-19 DIAGNOSIS — Z09 Encounter for follow-up examination after completed treatment for conditions other than malignant neoplasm: Secondary | ICD-10-CM

## 2022-12-19 DIAGNOSIS — I10 Essential (primary) hypertension: Secondary | ICD-10-CM

## 2022-12-19 MED ORDER — HYDRALAZINE HCL 25 MG PO TABS: ORAL_TABLET | ORAL | 3 refills | Status: DC

## 2022-12-19 MED ORDER — VALSARTAN 80 MG PO TABS
80.0000 mg | ORAL_TABLET | Freq: Every day | ORAL | 3 refills | Status: DC
Start: 2022-12-19 — End: 2023-12-19

## 2022-12-19 MED ORDER — METOPROLOL SUCCINATE ER 25 MG PO TB24: 25.0000 mg | ORAL_TABLET | Freq: Every day | ORAL | 3 refills | Status: DC

## 2022-12-31 ENCOUNTER — Ambulatory Visit (INDEPENDENT_AMBULATORY_CARE_PROVIDER_SITE_OTHER): Payer: 59 | Admitting: Family Medicine

## 2022-12-31 ENCOUNTER — Other Ambulatory Visit: Payer: Self-pay

## 2022-12-31 VITALS — BP 132/78 | HR 63 | Ht 65.0 in | Wt 161.0 lb

## 2022-12-31 DIAGNOSIS — M25521 Pain in right elbow: Secondary | ICD-10-CM

## 2022-12-31 DIAGNOSIS — M7711 Lateral epicondylitis, right elbow: Secondary | ICD-10-CM

## 2022-12-31 NOTE — Patient Instructions (Addendum)
Thank you for coming in today.   We can try shockwave.   If that does not work we could do PRP.   If not better I would consider MRI probably in 2025.

## 2022-12-31 NOTE — Progress Notes (Signed)
   Rubin Payor, PhD, LAT, ATC acting as a scribe for Clementeen Graham, MD.  Lori Love is a 62 y.o. female who presents to [facility] today for cont'd R elbow pain. Pt was last seen by Dr. Denyse Amass on 08/22/22 and was referred to OT, completing 5 visits (d/c on Aug 6th).  Today, pt reports she was doing fine, but then returned to playing tennis last week and the pain is starting to return. Pt locates pain to the lateral aspect of her R elbow. [name] concerned to end up in the condition she started was 5 month ago.   Aggravates: gripping and picking up objects, pronation/supination.   Pertinent review of systems: No fevers or chills  Relevant historical information: Hypertension   Exam:  BP 132/78   Pulse 63   Ht 5\' 5"  (1.651 m)   Wt 161 lb (73 kg)   LMP 09/14/2019 (Exact Date)   SpO2 97%   BMI 26.79 kg/m  General: Well Developed, well nourished, and in no acute distress.   MSK: Right elbow: Normal-appearing Tender palpation near lateral epicondyle. Pain present with resisted wrist extension and grip. Elbow motion and strength is intact.    Lab and Radiology Results  Diagnostic Limited MSK Ultrasound of: Right lateral elbow No visible tear present at lateral epicondyle common tender to extension origin.  She does have some calcification present at the proximal tendon origin consistent with healed tear or chronic calcific tendinitis. Impression: Right lateral epicondylitis with calcific change.                 Extracorporeal Shockwave Therapy Note    Patient is being treated today with ECSWT. Informed consent was obtained and patient tolerated procedure well.   Therapy performed by Clementeen Graham  Condition treated: Right lateral epicondylitis Treatment preset used: Tennis elbow Energy used: 90 mJ Frequency used: 10 Hz Number of pulses: 2000 Head Size: Medium Treatment #1 of #4   Assessment and Plan: 62 y.o. female with right lateral  epicondylitis.  Patient was seen for this problem about 4 to 5 months ago and had a course of occupational therapy that worked very well.  She abstained from tennis until recently.  [name] restarting tennis now and is starting to have pain return.  She continued home exercise program taught by occupational therapy.  We talked about options.  Plan for 1 treatment trial of shockwave performed today.  We could do more of them if she feels like it helps.  We could also try PRP.  In 2025 if needed would consider MRI for surgery planning. No charge for shockwave today.   PDMP not reviewed this encounter. Orders Placed This Encounter  Procedures   [place] LIMITED JOINT SPACE STRUCTURES UP RIGHT(NO LINKED CHARGES)    Order Specific Question:   Reason for Exam (SYMPTOM  OR DIAGNOSIS REQUIRED)    Answer:   right elbow pain    Order Specific Question:   Preferred imaging location?    Answer:   [facility]   No orders of the defined types were placed in this encounter.    Discussed warning signs or symptoms. Please see discharge instructions. Patient expresses understanding.   The above documentation has been reviewed and is accurate and complete Clementeen Graham, M.D.

## 2023-01-09 ENCOUNTER — Ambulatory Visit (INDEPENDENT_AMBULATORY_CARE_PROVIDER_SITE_OTHER): Payer: Self-pay | Admitting: Family Medicine

## 2023-01-09 DIAGNOSIS — M7711 Lateral epicondylitis, right elbow: Secondary | ICD-10-CM

## 2023-01-09 NOTE — Progress Notes (Signed)
   [facility] [address] [place] [address] Phone: [phone]   Extracorporeal Shockwave Therapy Note    Patient is being treated today with ECSWT. Informed consent was obtained and patient tolerated procedure well.   Therapy performed by Clementeen Graham  Condition treated: Lateral epicondylitis right Treatment preset used: Tendon Energy used: 90 mJ Frequency used: 14 Hz Number of pulses: 3000 Head Size: Medium Treatment #2 of #4  Electronically signed by:  [facility] 12:23 PM 01/09/23

## 2023-01-16 ENCOUNTER — Ambulatory Visit (INDEPENDENT_AMBULATORY_CARE_PROVIDER_SITE_OTHER): Payer: Self-pay | Admitting: Family Medicine

## 2023-01-16 DIAGNOSIS — M7711 Lateral epicondylitis, right elbow: Secondary | ICD-10-CM

## 2023-01-16 NOTE — Patient Instructions (Addendum)
Thank you for coming in today.   Lets pause.  I can do more if needed.

## 2023-01-16 NOTE — Progress Notes (Signed)
   [facility] [address] [place] [address] Phone: [phone]   Extracorporeal Shockwave Therapy Note    Patient is being treated today with ECSWT. Informed consent was obtained and patient tolerated procedure well.   Therapy performed by Clementeen Graham  Condition treated: Lateral epicondylitis right elbow Treatment preset used: Tendon Energy used: 90 mJ Frequency used: 14 Hz Number of pulses: 3000 Head Size: Medium Treatment #3 of #3  Electronically signed by:  [facility] 10:28 AM 01/16/23 [name] significantly improved.  Will pause shockwave here and restart it in the future if needed.

## 2023-01-24 ENCOUNTER — Ambulatory Visit (INDEPENDENT_AMBULATORY_CARE_PROVIDER_SITE_OTHER): Payer: Self-pay | Admitting: Family Medicine

## 2023-01-24 DIAGNOSIS — M7711 Lateral epicondylitis, right elbow: Secondary | ICD-10-CM

## 2023-01-24 NOTE — Progress Notes (Signed)
   [facility] [address] [place] [address] Phone: [phone]   Extracorporeal Shockwave Therapy Note    Patient is being treated today with ECSWT. Informed consent was obtained and patient tolerated procedure well.   Therapy performed by Clementeen Graham  Condition treated: Right lateral epicondylitis Treatment preset used: Lateral epicondylitis Energy used: 90 mJ Frequency used: 14 Hz Number of pulses: 3000 Head Size: Medium Treatment #4 of #4?  Electronically signed by:  [facility] 2:53 PM 01/24/23

## 2023-02-01 ENCOUNTER — Encounter (HOSPITAL_BASED_OUTPATIENT_CLINIC_OR_DEPARTMENT_OTHER): Payer: Self-pay | Admitting: Obstetrics & Gynecology

## 2023-03-28 ENCOUNTER — Encounter (HOSPITAL_BASED_OUTPATIENT_CLINIC_OR_DEPARTMENT_OTHER): Payer: Self-pay

## 2023-03-28 ENCOUNTER — Other Ambulatory Visit (HOSPITAL_BASED_OUTPATIENT_CLINIC_OR_DEPARTMENT_OTHER): Payer: Self-pay

## 2023-04-15 ENCOUNTER — Encounter: Payer: Self-pay | Admitting: Family Medicine

## 2023-04-15 ENCOUNTER — Ambulatory Visit: Payer: No Typology Code available for payment source | Admitting: Family Medicine

## 2023-04-15 ENCOUNTER — Other Ambulatory Visit (HOSPITAL_BASED_OUTPATIENT_CLINIC_OR_DEPARTMENT_OTHER): Payer: Self-pay | Admitting: Obstetrics & Gynecology

## 2023-04-15 VITALS — BP 130/70 | HR 90 | Temp 98.7°F | Wt 162.7 lb

## 2023-04-15 DIAGNOSIS — R059 Cough, unspecified: Secondary | ICD-10-CM | POA: Diagnosis not present

## 2023-04-15 DIAGNOSIS — G4709 Other insomnia: Secondary | ICD-10-CM

## 2023-04-15 LAB — POCT INFLUENZA A/B
Influenza A, POC: NEGATIVE
Influenza B, POC: NEGATIVE

## 2023-04-15 LAB — POC COVID19 BINAXNOW: SARS Coronavirus 2 Ag: NEGATIVE

## 2023-04-15 MED ORDER — BENZONATATE 100 MG PO CAPS: 100.0000 mg | ORAL_CAPSULE | Freq: Three times a day (TID) | ORAL | 0 refills | Status: DC | PRN

## 2023-04-15 MED ORDER — OSELTAMIVIR PHOSPHATE 75 MG PO CAPS: 75.0000 mg | ORAL_CAPSULE | Freq: Two times a day (BID) | ORAL | 0 refills | Status: DC

## 2023-04-15 NOTE — Progress Notes (Signed)
Established Patient Office Visit  Subjective   Patient ID: Lori Love, female    DOB: 07-31-1960  Age: 62 y.o. MRN: [id number]  Chief Complaint  Patient presents with   Cough    Patient complains of productive cough with yellow sputum, x2 days, Tried Advil    Sore Throat   Chills        Abdominal Pain    HPI   Lori Love is seen with onset late Saturday of cough, sore throat, chills, body aches, malaise.  Her Lori Love who is a Chartered loss adjuster was just recently diagnosed with influenza B and she was around her.  Lori Love denies any nausea, vomiting, or diarrhea.  Keeping down fluids.  Cough has been very bothersome at times especially at night.  She does have history of intermittent wheezing.  Has been using Xopenex sparingly.  Past Medical History:  Diagnosis Date   Arthritis    thumbs   Hypertension    followed by pcp and cardiology    Neck pain, chronic    Palpitations    cardiologist-- dr Jens Som---  ETT 03-06-2018 in epic normal with no significant arrythmia;  echo 05-16-2017 normal ;  event monitor 05-16-2017 SR with PVCs   Seasonal allergies    Seasonal asthma    followd by pcp   Past Surgical History:  Procedure Laterality Date   APPENDECTOMY  1996   DILATATION & CURETTAGE/HYSTEROSCOPY WITH MYOSURE N/A 11/03/2019   Procedure: DILATATION & CURETTAGE/HYSTEROSCOPY;  Surgeon: Jerene Bears, MD;  Location: [facility];  Service: Gynecology;  Laterality: N/A;   DILATION AND CURETTAGE OF UTERUS  2003   retained placenta post SVD   PLACEMENT OF BREAST IMPLANTS Bilateral 1998 approx.   TONSILLECTOMY AND ADENOIDECTOMY  child    reports that she has never smoked. She has never used smokeless tobacco. She reports current alcohol use of about 7.0 - 14.0 standard drinks of alcohol per week. She reports that she does not use drugs. family history includes Dementia in her father; Hypertension in her father and mother; Stroke in her mother. Allergies  Allergen  Reactions   Penicillins Hives   Sulfa Antibiotics Rash    Review of Systems  Constitutional:  Positive for chills, fever and malaise/fatigue.  HENT:  Positive for sore throat.   Respiratory:  Positive for cough.       Objective:     BP 130/70 (BP Location: Left Arm, Patient Position: Sitting, Cuff Size: Normal)   Pulse 90   Temp 98.7 F (37.1 C) (Oral)   Wt 162 lb 11.2 oz (73.8 kg)   LMP 09/14/2019 (Exact Date)   SpO2 98%   BMI 27.07 kg/m  BP Readings from Last 3 Encounters:  04/15/23 130/70  12/31/22 132/78  12/19/22 120/62   Wt Readings from Last 3 Encounters:  04/15/23 162 lb 11.2 oz (73.8 kg)  12/31/22 161 lb (73 kg)  12/19/22 159 lb (72.1 kg)      Physical Exam Vitals reviewed.  Constitutional:      General: [name] not in acute distress.    Appearance: [name] not ill-appearing.  HENT:     Right Ear: Tympanic membrane normal.     Left Ear: Tympanic membrane normal.     Mouth/Throat:     Mouth: Mucous membranes are moist.     Pharynx: Oropharynx is clear. No oropharyngeal exudate or posterior oropharyngeal erythema.  Cardiovascular:     Rate and Rhythm: Normal rate and regular rhythm.  Pulmonary:  Breath sounds: No wheezing or rales.  Musculoskeletal:     Cervical back: Neck supple.  Lymphadenopathy:     Cervical: No cervical adenopathy.  Neurological:     Mental Status: [name] alert.      Results for orders placed or performed in visit on 04/15/23  POC Influenza A/B  Result Value Ref Range   Influenza A, POC Negative Negative   Influenza B, POC Negative Negative  POC COVID-19  Result Value Ref Range   SARS Coronavirus 2 Ag Negative Negative      The ASCVD Risk score (Arnett DK, et al., 2019) failed to calculate for the following reasons:   The valid HDL cholesterol range is 20 to 100 mg/dL    Assessment & Plan:   Problem List Items Addressed This Visit   None Visit Diagnoses       Cough, unspecified type    -  Primary    Relevant Orders   POC Influenza A/B (Completed)   POC COVID-19 (Completed)     COVID and influenza testing negative.  Still have a fairly high index of suspicion for possible influenza given her positive exposure to Lori Love and fairly classic flulike symptoms.  [name] within 48-hour window of onset and we agreed to go ahead and cover with Tamiflu 75 mg twice daily for 5 days, especially in view of her asthma history.  She does not have any active wheezing on exam at this time.  No respiratory distress.[name] fluids and rest.  Follow-up for any worsening or persistent symptoms  No follow-ups on file.    Evelena Peat, MD

## 2023-04-22 ENCOUNTER — Telehealth: Payer: Self-pay

## 2023-04-22 NOTE — Telephone Encounter (Signed)
I informed patient of the message below. Patient wanted to clarify if there is any antibody testing that can done to determine which strain she had?

## 2023-04-22 NOTE — Telephone Encounter (Signed)
Copied from CRM [id number]. Topic: General - Other >> Apr 22, 2023 12:23 PM Truddie Crumble wrote: Reason for CRM: patient called wanting to know if she had  Flu A or Flu B by checking her antibodies. Pt would like to be called

## 2023-04-22 NOTE — Telephone Encounter (Signed)
Patient informed of results from last visit indicating negative results. Patient reported her husband has been recently diagnosed with Influenza A and she inquired of there is any way to check to see which type of influenza strain she may have had last week? Patient is concerned that she will get she will get the flu again but reportedly has been feeling better.

## 2023-04-24 ENCOUNTER — Other Ambulatory Visit: Payer: Self-pay | Admitting: Family Medicine

## 2023-04-24 ENCOUNTER — Other Ambulatory Visit: Payer: No Typology Code available for payment source

## 2023-04-24 ENCOUNTER — Encounter: Payer: Self-pay | Admitting: Family Medicine

## 2023-04-24 ENCOUNTER — Ambulatory Visit (INDEPENDENT_AMBULATORY_CARE_PROVIDER_SITE_OTHER): Payer: No Typology Code available for payment source | Admitting: Family Medicine

## 2023-04-24 VITALS — BP 124/80 | HR 67 | Temp 98.6°F | Ht 65.0 in | Wt 157.0 lb

## 2023-04-24 DIAGNOSIS — E559 Vitamin D deficiency, unspecified: Secondary | ICD-10-CM

## 2023-04-24 DIAGNOSIS — Z0000 Encounter for general adult medical examination without abnormal findings: Secondary | ICD-10-CM

## 2023-04-24 LAB — BASIC METABOLIC PANEL
BUN: 11 mg/dL (ref 6–23)
CO2: 24 meq/L (ref 19–32)
Calcium: 9.2 mg/dL (ref 8.4–10.5)
Chloride: 98 meq/L (ref 96–112)
Creatinine, Ser: 0.84 mg/dL (ref 0.40–1.20)
GFR: 75.48 mL/min (ref >60.00)
Glucose, Bld: 89 mg/dL (ref 70–99)
Potassium: 4.2 meq/L (ref 3.5–5.1)
Sodium: 132 meq/L — ABNORMAL LOW (ref 135–145)

## 2023-04-24 LAB — LIPID PANEL
Cholesterol: 231 mg/dL — ABNORMAL HIGH (ref 0–200)
HDL: 81.7 mg/dL (ref >39.00)
LDL Cholesterol: 132 mg/dL — ABNORMAL HIGH (ref 0–99)
NonHDL: 149.05
Total CHOL/HDL Ratio: 3
Triglycerides: 86 mg/dL (ref 0.0–149.0)
VLDL: 17.2 mg/dL (ref 0.0–40.0)

## 2023-04-24 LAB — HEPATIC FUNCTION PANEL
ALT: 23 U/L (ref 0–35)
AST: 28 U/L (ref 0–37)
Albumin: 4.7 g/dL (ref 3.5–5.2)
Alkaline Phosphatase: 75 U/L (ref 39–117)
Bilirubin, Direct: 0.1 mg/dL (ref 0.0–0.3)
Total Bilirubin: 0.5 mg/dL (ref 0.2–1.2)
Total Protein: 7.8 g/dL (ref 6.0–8.3)

## 2023-04-24 LAB — VITAMIN D 25 HYDROXY (VIT D DEFICIENCY, FRACTURES): VITD: 30.06 ng/mL (ref 30.00–100.00)

## 2023-04-24 MED ORDER — LEVALBUTEROL TARTRATE 45 MCG/ACT IN AERO: 1.0000 | INHALATION_SPRAY | Freq: Four times a day (QID) | RESPIRATORY_TRACT | 1 refills | Status: DC | PRN

## 2023-04-24 NOTE — Patient Instructions (Signed)
 Consider colonoscopy later this year  Consider pneumonia vaccine and Shingrix this year.

## 2023-04-24 NOTE — Telephone Encounter (Signed)
 Patient informed of the message below and voiced understanding

## 2023-04-24 NOTE — Progress Notes (Signed)
 Established Patient Office Visit  Subjective   Patient ID: Lori Love, female    DOB: Sep 11, 1960  Age: 63 y.o. MRN: [id number]  Chief Complaint  Patient presents with   Annual Exam    HPI   Lori Love is seen today for physical exam.  She had recent respiratory illness with possible influenza.  Testing here was negative but she was several days into illness.  [name] overall improved at this time.  Her husband just tested positive for influenza A a few days ago.  Her daughter recently had influenza B.  Lori Love does have history of mild intermittent asthma.  Uses Xopenex  as needed.  She also has history of hypertension followed by cardiology and currently well-controlled.  Strong family history of hypertension  She continues to stay very active with tennis  Health maintenance reviewed:  Health Maintenance  Topic Date Due   Pneumococcal Vaccine 88-53 Years old (1 of 2 - PCV) Never done   HIV Screening  Never done   Zoster Vaccines- Shingrix (1 of 2) Never done   COVID-19 Vaccine (4 - 2024-25 season) 11/18/2022   DTaP/Tdap/Td (2 - Td or Tdap) 04/23/2024 (Originally 03/25/2022)   Fecal DNA (Cologuard)  11/12/2023   MAMMOGRAM  07/24/2024   Cervical Cancer Screening (HPV/Pap Cotest)  09/22/2025   Hepatitis C Screening  Completed   HPV VACCINES  Aged Out   INFLUENZA VACCINE  Discontinued   -Followed by gynecologist for Pap smears and mammograms. -No history of shingles vaccine and she will consider at some point this year -Also discussed recommendation that she consider pneumonia vaccination especially with her history of asthma -She has had previous Cologuard and would be due for repeat this August.  She would like to get at least 1 normal colonoscopy and will call back if she is interested in scheduling that for the summer  Family history-significant for father having Alzheimer's disease in his 61s and died age 75.  Mom had hypertension and had strokes died of stroke complications age  64.  She has one half brother and 2 half sisters alive and well.  No known family history of cancer or heart disease.  Social history-she is married.  Never smoked.  2 glasses of wine per day.  Enjoys tennis and plays in a league  Past Medical History:  Diagnosis Date   Arthritis    thumbs   Hypertension    followed by pcp and cardiology    Neck pain, chronic    Palpitations    cardiologist-- dr pietro---  ETT 03-06-2018 in epic normal with no significant arrythmia;  echo 05-16-2017 normal ;  event monitor 05-16-2017 SR with PVCs   Seasonal allergies    Seasonal asthma    followd by pcp   Past Surgical History:  Procedure Laterality Date   APPENDECTOMY  1996   DILATATION & CURETTAGE/HYSTEROSCOPY WITH MYOSURE N/A 11/03/2019   Procedure: DILATATION & CURETTAGE/HYSTEROSCOPY;  Surgeon: Cleotilde Ronal RAMAN, MD;  Location: [facility];  Service: Gynecology;  Laterality: N/A;   DILATION AND CURETTAGE OF UTERUS  2003   retained placenta post SVD   PLACEMENT OF BREAST IMPLANTS Bilateral 1998 approx.   TONSILLECTOMY AND ADENOIDECTOMY  child    reports that she has never smoked. She has never used smokeless tobacco. She reports current alcohol use of about 7.0 - 14.0 standard drinks of alcohol per week. She reports that she does not use drugs. family history includes Dementia in her father; Hypertension in her father  and mother; Stroke in her mother. Allergies  Allergen Reactions   Penicillins Hives   Sulfa Antibiotics Rash    Review of Systems  Constitutional:  Negative for chills, fever, malaise/fatigue and weight loss.  HENT:  Negative for hearing loss.   Eyes:  Negative for blurred vision and double vision.  Respiratory:  Negative for hemoptysis and shortness of breath.   Cardiovascular:  Negative for chest pain, palpitations and leg swelling.  Gastrointestinal:  Negative for abdominal pain, blood in stool, constipation and diarrhea.  Genitourinary:  Negative for  dysuria.  Skin:  Negative for rash.  Neurological:  Negative for dizziness, speech change, seizures, loss of consciousness and headaches.  Psychiatric/Behavioral:  Negative for depression.       Objective:     BP 124/80 (BP Location: Left Arm, Patient Position: Sitting, Cuff Size: Normal)   Pulse 67   Temp 98.6 F (37 C) (Oral)   Ht 5' 5 (1.651 m)   Wt 157 lb (71.2 kg)   LMP 09/14/2019 (Exact Date)   SpO2 98%   BMI 26.13 kg/m  BP Readings from Last 3 Encounters:  04/24/23 124/80  04/15/23 130/70  12/31/22 132/78   Wt Readings from Last 3 Encounters:  04/24/23 157 lb (71.2 kg)  04/15/23 162 lb 11.2 oz (73.8 kg)  12/31/22 161 lb (73 kg)      Physical Exam Vitals reviewed.  Constitutional:      Appearance: [name] well-developed.  HENT:     Head: Normocephalic and atraumatic.  Eyes:     Pupils: Pupils are equal, round, and reactive to light.  Neck:     Thyroid: No thyromegaly.  Cardiovascular:     Rate and Rhythm: Normal rate and regular rhythm.     Heart sounds: Normal heart sounds. No murmur heard. Pulmonary:     Effort: No respiratory distress.     Breath sounds: Normal breath sounds. No wheezing or rales.  Abdominal:     General: Bowel sounds are normal. There is no distension.     Palpations: Abdomen is soft. There is no mass.     Tenderness: There is no abdominal tenderness. There is no guarding or rebound.  Musculoskeletal:        General: Normal range of motion.     Cervical back: Normal range of motion and neck supple.  Lymphadenopathy:     Cervical: No cervical adenopathy.  Skin:    Findings: No rash.  Neurological:     Mental Status: [name] alert and oriented to person, place, and time.     Cranial Nerves: No cranial nerve deficit.  Psychiatric:        Behavior: Behavior normal.        Thought Content: Thought content normal.        Judgment: Judgment normal.      No results found for any visits on 04/24/23.    The ASCVD Risk score  (Arnett DK, et al., 2019) failed to calculate for the following reasons:   The valid HDL cholesterol range is 20 to 100 mg/dL    Assessment & Plan:   Problem List Items Addressed This Visit       Unprioritized   Vitamin D  deficiency   Relevant Orders   VITAMIN D  25 Hydroxy (Vit-D Deficiency, Fractures)   Other Visit Diagnoses       Physical exam    -  Primary   Relevant Orders   Basic metabolic panel   Lipid panel  CBC with Differential/Platelet   Hepatic function panel     63 year old female who has history of hypertension well-controlled on multidrug regimen seen today for well visit.  She sees gynecologist yearly for Pap smears and mammograms.  We discussed the following health maintenance items  -Obtain labs as above.  Patient requesting vitamin D  level with history of being low previously. -Discussed Shingrix vaccine and she will consider -Also recommend she consider pneumonia vaccine at some point this year.  We were out today. -She will consider colonoscopy this summer.  She did have Cologuard about 3 years ago -Continue regular exercise habits  No follow-ups on file.    Wolm Scarlet, MD

## 2023-04-25 LAB — CBC WITH DIFFERENTIAL/PLATELET
Absolute Lymphocytes: 1983 {cells}/uL (ref 850–3900)
Absolute Monocytes: 592 {cells}/uL (ref 200–950)
Basophils Absolute: 52 {cells}/uL (ref 0–200)
Basophils Relative: 0.7 %
Eosinophils Absolute: 148 {cells}/uL (ref 15–500)
Eosinophils Relative: 2 %
HCT: 37.5 % (ref 35.0–45.0)
Hemoglobin: 12.6 g/dL (ref 11.7–15.5)
MCH: 31.7 pg (ref 27.0–33.0)
MCHC: 33.6 g/dL (ref 32.0–36.0)
MCV: 94.2 fL (ref 80.0–100.0)
MPV: 9.6 fL (ref 7.5–12.5)
Monocytes Relative: 8 %
Neutro Abs: 4625 {cells}/uL (ref 1500–7800)
Neutrophils Relative %: 62.5 %
Platelets: 408 10*3/uL — ABNORMAL HIGH (ref 140–400)
RBC: 3.98 10*6/uL (ref 3.80–5.10)
RDW: 11.9 % (ref 11.0–15.0)
Total Lymphocyte: 26.8 %
WBC: 7.4 10*3/uL (ref 3.8–10.8)

## 2023-04-26 ENCOUNTER — Ambulatory Visit: Payer: Self-pay | Admitting: Family Medicine

## 2023-04-26 ENCOUNTER — Encounter: Payer: Self-pay | Admitting: Adult Health

## 2023-04-26 ENCOUNTER — Ambulatory Visit: Payer: No Typology Code available for payment source | Admitting: Adult Health

## 2023-04-26 VITALS — BP 120/80 | HR 72 | Temp 98.1°F | Ht 65.0 in

## 2023-04-26 DIAGNOSIS — J4521 Mild intermittent asthma with (acute) exacerbation: Secondary | ICD-10-CM

## 2023-04-26 MED ORDER — IPRATROPIUM-ALBUTEROL 0.5-2.5 (3) MG/3ML IN SOLN
3.0000 mL | Freq: Once | RESPIRATORY_TRACT | Status: AC
  Administered 2023-04-26: 3 mL via RESPIRATORY_TRACT

## 2023-04-26 MED ORDER — PREDNISONE 10 MG PO TABS: ORAL_TABLET | ORAL | 0 refills | Status: DC

## 2023-04-26 NOTE — Telephone Encounter (Signed)
 Chief Complaint: SOB Symptoms: can't cough/catch breath Frequency: constant  Pertinent Negatives: Patient denies  Disposition: [] ED /[] Urgent Care (no appt availability in office) / [x] Appointment(In office/virtual)/ []  [facility]/ [] Home Care/ [] Refused Recommended Disposition /[] [facility]/ []  Follow-up with PCP Additional Notes: Pt complaining of SOB due to asthma flare-up. Pt had positive flu dx on 1/27. Pt stated she was feeling better and saw Burchette on 2/5 for well visit. Pt was told lungs were clear, but was coughing up white mucus. Pt state she felt fine and had no fever or SOB until yesterday afternoon. Pt said the asthma kicked in out of the blue. Pt couldn't catch breath and the emergency inhaler was not working. Pt had some Prednisone  left over from her dog prescription. Pt took 40mg  and got relief. Pt has now  lost the ability to clear her throat and cough. Pt denies SOB right now and feels more Prednisone  would help. Per protocol, pt to be seen within 4 hours. Pt has appt with NP at 1000. RN gave care advice and pt verbalized understanding.           Copied from CRM [id number]. Topic: Clinical - Red Word Triage >> Apr 26, 2023  8:44 AM Lori Love wrote: Lori Love that prompted transfer to Nurse Triage: Patient was seen by Dr. Micheal two weeks ago for flu and then, just two days ago as well - she is having trouble breathing and even coughing, she took some left over presidone to help her - she would like to know what to do next. Reason for Disposition  [1] MILD difficulty breathing (e.g., minimal/no SOB at rest, SOB with walking, pulse <100) AND [2] NEW-onset or WORSE than normal  Answer Assessment - Initial Assessment Questions 1. RESPIRATORY STATUS: Describe your breathing? (e.g., wheezing, shortness of breath, unable to speak, severe coughing)      Can't cough, feels like foot on throat  2. ONSET: When did this breathing problem begin?       Thursday  3. PATTERN Does the difficult breathing come and go, or has it been constant since it started?      Constant  4. SEVERITY: How bad is your breathing? (e.g., mild, moderate, severe)    - MILD: No SOB at rest, mild SOB with walking, speaks normally in sentences, can lie down, no retractions, pulse < 100.    - MODERATE: SOB at rest, SOB with minimal exertion and prefers to sit, cannot lie down flat, speaks in phrases, mild retractions, audible wheezing, pulse 100-120.    - SEVERE: Very SOB at rest, speaks in single words, struggling to breathe, sitting hunched forward, retractions, pulse > 120      Moderate  5. RECURRENT SYMPTOM: Have you had difficulty breathing before? If Yes, ask: When was the last time? and What happened that time?      Yes but long time ago  6. CARDIAC HISTORY: Do you have any history of heart disease? (e.g., heart attack, angina, bypass surgery, angioplasty)      High blood pressure  7. LUNG HISTORY: Do you have any history of lung disease?  (e.g., pulmonary embolus, asthma, emphysema)     Asthma  8. CAUSE: What do you think is causing the breathing problem?      Getting go over  9. OTHER SYMPTOMS: Do you have any other symptoms? (e.g., dizziness, runny nose, cough, chest pain, fever)     White mucus -productive-  10. O2 SATURATION  MONITOR:  Do you use an oxygen saturation monitor (pulse oximeter) at home? If Yes, ask: What is your reading (oxygen level) today? What is your usual oxygen saturation reading? (e.g., 95%)       98 in office 2/5  12. TRAVEL: Have you traveled out of the country in the last month? (e.g., travel history, exposures)       No  Protocols used: Breathing Difficulty-A-AH

## 2023-04-26 NOTE — Progress Notes (Signed)
 Subjective:    Patient ID: Lori Love, female    DOB: October 23, 1960, 63 y.o.   MRN: [id number]  HPI 63 year old female who  has a past medical history of Arthritis, Hypertension, Neck pain, chronic, Palpitations, Seasonal allergies, and Seasonal asthma.  [name] a patient of Dr. Micheal who I am seeing today.  She reports having a positive flu diagnosis on 04/15/2023.  She was feeling better and saw her PCP on February 5 for her physical exam.  At this time she was coughing up white mucus but her lungs were clear on exam and she felt pretty good.  Yesterday afternoon she started to experience shortness of breath and tightness that would not allow her to cough, she felt as though she could not catch her breath.  Her albuterol  inhaler was not helpful.  She found some prednisone  at home and took 40 mg and got some relief.      Review of Systems See HPI   Past Medical History:  Diagnosis Date   Arthritis    thumbs   Hypertension    followed by pcp and cardiology    Neck pain, chronic    Palpitations    cardiologist-- dr pietro---  ETT 03-06-2018 in epic normal with no significant arrythmia;  echo 05-16-2017 normal ;  event monitor 05-16-2017 SR with PVCs   Seasonal allergies    Seasonal asthma    followd by pcp    Social History   Socioeconomic History   Marital status: Married    Spouse name: Not on file   Number of children: 3   Years of education: Not on file   Highest education level: Not on file  Occupational History    Comment: Realtor  Tobacco Use   Smoking status: Never   Smokeless tobacco: Never  Vaping Use   Vaping status: Never Used  Substance and Sexual Activity   Alcohol use: Yes    Alcohol/week: 7.0 - 14.0 standard drinks of alcohol    Types: 7 - 14 Glasses of wine per week   Drug use: Never   Sexual activity: Yes    Birth control/protection: Post-menopausal    Comment: vasectomy   Other Topics Concern   Not on file  Social History Narrative   Not  on file   Social Drivers of Health   Financial Resource Strain: Not on file  Food Insecurity: Not on file  Transportation Needs: Not on file  Physical Activity: Not on file  Stress: Not on file  Social Connections: Not on file  Intimate Partner Violence: Not on file    Past Surgical History:  Procedure Laterality Date   APPENDECTOMY  1996   DILATATION & CURETTAGE/HYSTEROSCOPY WITH MYOSURE N/A 11/03/2019   Procedure: DILATATION & CURETTAGE/HYSTEROSCOPY;  Surgeon: Cleotilde Ronal RAMAN, MD;  Location: [facility] [place];  Service: Gynecology;  Laterality: N/A;   DILATION AND CURETTAGE OF UTERUS  2003   retained placenta post SVD   PLACEMENT OF BREAST IMPLANTS Bilateral 1998 approx.   TONSILLECTOMY AND ADENOIDECTOMY  child    Family History  Problem Relation Age of Onset   Hypertension Mother    Stroke Mother    Hypertension Father    Dementia Father     Allergies  Allergen Reactions   Penicillins Hives   Sulfa Antibiotics Rash    Current Outpatient Medications on File Prior to Visit  Medication Sig Dispense Refill   ALPRAZolam  (XANAX ) 0.5 MG tablet TAKE 1 TABLET BY MOUTH AS  NEEDED FOR ANXIETY. 30 tablet 0   benzonatate  (TESSALON  PERLES) 100 MG capsule Take 1 capsule (100 mg total) by mouth 3 (three) times daily as needed. 30 capsule 0   cetirizine (ZYRTEC) 10 MG tablet Take 10 mg by mouth daily.     estradiol  (ESTRACE ) 0.1 MG/GM vaginal cream 1 gram vaginally twice weekly 42.5 g 1   Estradiol -Norethindrone  Acet 0.5-0.1 MG tablet Take 1 tablet by mouth daily. 84 tablet 3   hydrALAZINE  (APRESOLINE ) 25 MG tablet Take 25 mg twice a day if needed for systolic B/P 160 or greater 30 tablet 3   ibuprofen (ADVIL,MOTRIN) 200 MG tablet Take 200 mg by mouth every 6 (six) hours as needed for pain.      levalbuterol  (XOPENEX  HFA) 45 MCG/ACT inhaler Inhale 1-2 puffs into the lungs every 6 (six) hours as needed for wheezing. 1 each 1   metoprolol  succinate (TOPROL -XL) 25 MG 24 hr tablet  Take 1 tablet (25 mg total) by mouth daily. 90 tablet 3   Multiple Vitamin (MULTIVITAMIN) tablet Take 1 tablet by mouth daily.     nitrofurantoin , macrocrystal-monohydrate, (MACROBID ) 100 MG capsule Take 1 capsule (100 mg total) by mouth 2 (two) times daily. Take if having symptoms of a UTI. 10 capsule 0   oseltamivir  (TAMIFLU ) 75 MG capsule Take 1 capsule (75 mg total) by mouth 2 (two) times daily. 10 capsule 0   triamcinolone  (NASACORT ) 55 MCG/ACT nasal inhaler Place 2 sprays into the nose daily as needed.      valACYclovir  (VALTREX ) 500 MG tablet Take 1 tablet (500 mg total) by mouth daily. As needed 30 tablet 0   valsartan  (DIOVAN ) 80 MG tablet Take 1 tablet (80 mg total) by mouth daily. 90 tablet 3   No current facility-administered medications on file prior to visit.    BP 120/80   Pulse 68   Temp 98.1 F (36.7 C) (Oral)   Ht 5' 5 (1.651 m)   LMP 09/14/2019 (Exact Date)   SpO2 100%   BMI 26.13 kg/m       Objective:   Physical Exam Vitals reviewed.  Constitutional:      Appearance: Normal appearance.  Cardiovascular:     Rate and Rhythm: Normal rate and regular rhythm.     Pulses: Normal pulses.     Heart sounds: Normal heart sounds.  Pulmonary:     Effort: Pulmonary effort is normal. Tachypnea present.     Breath sounds: Decreased air movement present. Examination of the right-upper field reveals wheezing. Examination of the left-upper field reveals wheezing. Examination of the right-middle field reveals wheezing. Examination of the left-middle field reveals wheezing. Examination of the right-lower field reveals wheezing. Examination of the left-lower field reveals wheezing. Decreased breath sounds and wheezing present. No rhonchi or rales.  Musculoskeletal:        General: Normal range of motion.  Skin:    General: Skin is warm and dry.  Neurological:     General: No focal deficit present.     Mental Status: [name] alert and oriented to person, place, and time.   Psychiatric:        Mood and Affect: Mood normal.        Behavior: Behavior normal.        Thought Content: Thought content normal.        Judgment: Judgment normal.        Assessment & Plan:  1. Mild intermittent asthma with exacerbation (Primary) - ipratropium-albuterol  (DUONEB) 0.5-2.5 (3) MG/3ML  nebulizer solution 3 mL - predniSONE  (DELTASONE ) 10 MG tablet; 40 mg x 3 days, 20 mg x 3 days, 10 mg x 3 days  Dispense: 21 tablet; Refill: 0 - Follow up if not improving. If worsening symptoms over the weekend then go to the ER   She was given a DuoNeb in the office today.  Upon completion of the DuoNebs she did report that she is breathing easier, felt better, and was able to cough again.  On exam wheezing had resolved and she was moving air throughout her entire lung fields.

## 2023-04-30 ENCOUNTER — Ambulatory Visit: Payer: Self-pay | Admitting: Family Medicine

## 2023-04-30 NOTE — Telephone Encounter (Signed)
Copied from CRM [id number]. Topic: Clinical - Medication Question >> Apr 30, 2023 11:06 AM Pascal Lux wrote: Reason for CRM: Patient stated she's been on predniSONE (DELTASONE) 10 MG tablet [[id number]] for 5 days and is unable to cough. Would like to know if she needs to come in and get seen.   Patient states she had the Flu then her asthma flared up.  Seen in office 4 days ago.  Patient given a breathing treatment that day and put on Prednisone. Patient states that she still cannot cough and her voice is still going  Patient states she can walk without giving out of breath.  She states she is a little better but not 100%.  She states she has thin white mucous and tries to cough but she cannot cough.  Her voice is slightly better per patient but still hoarse.  Patient still unable to cough but she states that she can go up and down stairs now but she wants to know if Kandee Keen that she saw wants her to come in for a follow up visit or if he wants to send in anything else. Patient has been taken Prednisone for 5 days now.  Patient just wants advice from PCP/provider she saw on whether they want to re-examine her. [name] not feeling as great as she thought she would be and she is fine if she has to re-examined.  Notes from previous provider visit state to follow up on how she is doing so she wanted to let him know this information. She states that she doesn't feel like she is wheezing at this time but she is able to do more than she was able to at previous visit but she isn't better like she thought she would be.  [name] also advised that if she gets worse to go immediately to the emergency room.  She verbalized understanding on this.

## 2023-05-01 ENCOUNTER — Encounter: Payer: Self-pay | Admitting: Family Medicine

## 2023-05-01 ENCOUNTER — Ambulatory Visit: Payer: No Typology Code available for payment source | Admitting: Family Medicine

## 2023-05-01 ENCOUNTER — Ambulatory Visit: Payer: No Typology Code available for payment source

## 2023-05-01 VITALS — BP 160/88 | HR 74 | Temp 98.1°F

## 2023-05-01 DIAGNOSIS — R0600 Dyspnea, unspecified: Secondary | ICD-10-CM | POA: Diagnosis not present

## 2023-05-01 DIAGNOSIS — R059 Cough, unspecified: Secondary | ICD-10-CM | POA: Diagnosis not present

## 2023-05-01 NOTE — Patient Instructions (Signed)
Try plain Mucinex 1,200 mg twice daily  Plenty of fluids  Try to slowly progress walking as tolerated.

## 2023-05-01 NOTE — Telephone Encounter (Signed)
Patient has appt today.

## 2023-05-01 NOTE — Progress Notes (Unsigned)
Established Patient Office Visit  Subjective   Patient ID: Lori Love, female    DOB: [birth year]  Age: 63 y.o. MRN: [id number]  Chief Complaint  Patient presents with   Asthma    HPI  {History (Optional):23778} Lori Love is seen with some ongoing cough and relates some shortness of breath.  No chest pains.  No pleuritic pain.  She had recent presumed influenza.  After time of her physical on the fifth here she developed increasing wheezing and was seen here on the seventh and placed on prednisone taper and given nebulizer.  She feels like her asthma flared up following the flu.  She has had some issues with her voice going out although this is slightly improved today.  She does have some shortness of breath with walking.  She feels like she has some mucus in her chest but has difficulty getting this up.  She also had questions regarding recent labs.  She had sodium slightly low 132.  No thiazide use.  Recent platelets 408,000 which are slightly elevated but this followed her recent acute illness.  Past Medical History:  Diagnosis Date   Arthritis    thumbs   Hypertension    followed by pcp and cardiology    Neck pain, chronic    Palpitations    cardiologist-- dr Jens Som---  ETT 03-06-2018 in epic normal with no significant arrythmia;  echo 05-16-2017 normal ;  event monitor 05-16-2017 SR with PVCs   Seasonal allergies    Seasonal asthma    followd by pcp   Past Surgical History:  Procedure Laterality Date   APPENDECTOMY  1996   DILATATION & CURETTAGE/HYSTEROSCOPY WITH MYOSURE N/A 11/03/2019   Procedure: DILATATION & CURETTAGE/HYSTEROSCOPY;  Surgeon: Jerene Bears, MD;  Location: [facility] [place];  Service: Gynecology;  Laterality: N/A;   DILATION AND CURETTAGE OF UTERUS  2003   retained placenta post SVD   PLACEMENT OF BREAST IMPLANTS Bilateral 1998 approx.   TONSILLECTOMY AND ADENOIDECTOMY  child    reports that she has never smoked. She has never used smokeless  tobacco. She reports current alcohol use of about 7.0 - 14.0 standard drinks of alcohol per week. She reports that she does not use drugs. family history includes Dementia in her father; Hypertension in her father and mother; Stroke in her mother. Allergies  Allergen Reactions   Penicillins Hives   Sulfa Antibiotics Rash    Review of Systems  Constitutional:  Negative for chills and fever.  Respiratory:  Positive for cough and shortness of breath. Negative for hemoptysis.   Cardiovascular:  Negative for chest pain, orthopnea and leg swelling.      Objective:     BP (!) 160/88 (BP Location: Left Arm, Patient Position: Sitting, Cuff Size: Normal)   Pulse 74   Temp 98.1 F (36.7 C) (Oral)   LMP 09/14/2019 (Exact Date)   SpO2 99%  BP Readings from Last 3 Encounters:  05/01/23 (!) 160/88  04/26/23 120/80  04/24/23 124/80   Wt Readings from Last 3 Encounters:  04/24/23 157 lb (71.2 kg)  04/15/23 162 lb 11.2 oz (73.8 kg)  12/31/22 161 lb (73 kg)      Physical Exam Vitals reviewed.  Constitutional:      General: [name] not in acute distress.    Appearance: [name] not ill-appearing.  Cardiovascular:     Rate and Rhythm: Normal rate and regular rhythm.  Pulmonary:     Effort: Pulmonary effort is normal. No respiratory  distress.     Breath sounds: Normal breath sounds. No wheezing or rales.  Musculoskeletal:     Cervical back: Neck supple.     Right lower leg: No edema.     Left lower leg: No edema.  Neurological:     Mental Status: [name] alert.      No results found for any visits on 05/01/23.  {Labs (Optional):23779}  The 10-year ASCVD risk score (Arnett DK, et al., 2019) is: 5.9%    Assessment & Plan:   Problem List Items Addressed This Visit   None Visit Diagnoses       Cough, unspecified type    -  Primary   Relevant Orders   DG Chest 2 View     Patient had recent influenza with asthma exacerbation following this.  [name] currently finishing  prednisone taper.  She has Xopenex to use as needed.  She has not had any fever.  Her main issue is difficulty coughing up mucus and also some ongoing shortness of breath.  Pulse oximetry 99% room air today.  -Chest x-ray obtained-no cardiomegaly.  No pulmonary effusions.  No consolidations.  This will be over read.  -Finish prednisone taper -Continue Xopenex as needed -Suggested over-the-counter Mucinex 1200 mg twice daily and stay well-hydrated -Follow-up immediately for any fever or increased shortness of breath -We suggested that she try to slowly build back her activity with walking prior to playing any tennis  No follow-ups on file.    Lori Peat, MD

## 2023-05-09 ENCOUNTER — Other Ambulatory Visit (HOSPITAL_COMMUNITY): Payer: Self-pay

## 2023-05-09 ENCOUNTER — Telehealth: Payer: Self-pay | Admitting: Pharmacy Technician

## 2023-05-09 NOTE — Telephone Encounter (Signed)
 Noted

## 2023-05-09 NOTE — Telephone Encounter (Signed)
Pharmacy Patient Advocate Encounter   Received notification from RX Request Messages that prior authorization for Levalbuterol Tartrate 45MCG/ACT aerosol is required/requested.   Insurance verification completed.   The patient is insured through [facility] .   Per test claim: PA required; PA submitted to above mentioned insurance via CoverMyMeds Key/confirmation #/EOC [id number] Status is pending

## 2023-05-09 NOTE — Telephone Encounter (Signed)
PA request has been Submitted. New Encounter created for follow up. For additional info see Pharmacy Prior Auth telephone encounter from 05-09-2023.

## 2023-05-14 NOTE — Telephone Encounter (Signed)
 Pharmacy Patient Advocate Encounter  Received notification from [facility] that Prior Authorization for Levalbuterol tartrate has been DENIED.  Full denial letter will be uploaded to the media tab. See denial reason below.    PA #/Case ID/Reference #: Not given, please see denial letter indexed in pt's media tab

## 2023-05-17 ENCOUNTER — Telehealth: Payer: Self-pay | Admitting: Cardiology

## 2023-05-17 NOTE — Telephone Encounter (Signed)
 Pt c/o BP issue: STAT if pt c/o blurred vision, one-sided weakness or slurred speech  1. What are your last 5 BP readings? 158/90,164/92, 154/94, 150/91, 148/87  2. Are you having any other symptoms (ex. Dizziness, headache, blurred vision, passed out)? headache  3. What is your BP issue? Medication is not bringing her BP down

## 2023-05-17 NOTE — Telephone Encounter (Signed)
 RN Spoke to DOD Dr. Rennis Golden  Dr. Rennis Golden states:   -BP should improve in 1-2 week post prednisone use   -can take hydralazine scheduled 2-3x daily   Patient identification verified by 2 forms. Marilynn Rail, RN    Called and spoke to patient  Relayed DOD recommendations  Patient scheduled for OV follow up 3/21 at 11:00am  Advised patient:  -to keep BP log, review at OV  -follow up next week if BP does not improve and sx persist  Reviewed ED warning signs/precautions  Patient verbalized understanding, no questions at this time

## 2023-05-17 NOTE — Telephone Encounter (Signed)
 Patient identification verified by 2 forms. Marilynn Rail, RN    Called and spoke to patient  Patient states:   -BP has been elevated for 2.5 weeks   -she is having a hard time getting BP under control   -she recently had the flu and was treated with prednisone for 10 days post asthma attack   -high BP is causing headaches, sometimes wakes up with headaches   -2/27 BP: 185/92, two hours after medication   -2/28 BP this morning 165/92, took hydralazine   -Medications: Metoprolol 25mg , Valsartan 80-mg daily, hydralazine 25mg  BID PRN SBP >160   -notices BP will increase 3-4 hours after taking hydralazine   -would like to know if she can take hydralazine more frequently  Patient denies:   -chest pain   -SOB/difficulty breathing  Informed patient nursing will speak to DOD and call with recommendations  Patient agrees, no questions at this time

## 2023-05-20 MED ORDER — ALBUTEROL SULFATE HFA 108 (90 BASE) MCG/ACT IN AERS: INHALATION_SPRAY | RESPIRATORY_TRACT | 0 refills | Status: AC

## 2023-05-20 NOTE — Addendum Note (Signed)
 Addended by: Laneta Simmers L on: 05/20/2023 03:00 PM   Modules accepted: Orders

## 2023-05-20 NOTE — Telephone Encounter (Signed)
 Rx sent

## 2023-05-20 NOTE — Telephone Encounter (Signed)
 Noted.

## 2023-06-05 ENCOUNTER — Other Ambulatory Visit: Payer: Self-pay | Admitting: Cardiology

## 2023-06-07 ENCOUNTER — Ambulatory Visit: Payer: No Typology Code available for payment source | Admitting: Emergency Medicine

## 2023-06-07 NOTE — Progress Notes (Deleted)
 Cardiology Office Note:    Date:  06/07/2023  ID:  Lori Love, DOB [birth year], MRN [id number] PCP: Kristian Covey, MD  [facility] Providers Cardiologist:  Olga Millers, MD { Click to update primary MD,subspecialty MD or APP then REFRESH:1}    {Click to Open Review  :1}   Patient Profile:      Chief Complaint: Follow-up hypertension  History of Present Illness:  Lori Love is a 63 y.o. female with visit-pertinent history of palpitations, hypertension  Echocardiogram February 2019 showed LVEF 60-65%, mild LVH, no RWMA, no valvular abnormalities.  Event monitor February 2019 showed sinus with PVCs.  Exercise treadmill December 2019 showed hypertensive response, no significant arrhythmias and no diagnostic ST changes.  CT calcium score May 2023 was 0.  Last seen in clinic on 12/19/2022 by Dr. Jens Som.  Patient was without any cardiovascular concerns or complaints at that time.  Medication management was continued without any changes.  Patient has to follow-up in 12 months.  Patient called into the nurse triage on 05/17/2023 with complaints of hypertension.  She notes she recently had the flu and was being treated with prednisone for 10 days post asthma attack.  She was made an OV for further evaluation.  Discussed the use of AI scribe software for clinical note transcription with the patient, who gave verbal consent to proceed.  History of Present Illness            Review of systems:  Please see the history of present illness. All other systems are reviewed and otherwise negative. ***     Home Medications:    No outpatient medications have been marked as taking for the 06/07/23 encounter (Appointment) with Denyce Robert, NP.   Studies Reviewed:       CT cardiac scoring 08/04/2021 Coronary calcium score of 0. This is a low risk study.   Exercise tolerance test 03/06/2018 1. Good exercise tolerance.  2. Nonspecific upsloping ST depression with exercise  in several leads, but no definite evidence for ischemia.   Echocardiogram 05/16/2017 - Left ventricle: The cavity size was normal. Wall thickness was    increased in a pattern of mild LVH. Systolic function was normal.    The estimated ejection fraction was in the range of 60% to 65%.    Wall motion was normal; there were no regional wall motion    abnormalities. Left ventricular diastolic function parameters    were normal.  - Atrial septum: No defect or patent foramen ovale was identified.  Risk Assessment/Calculations:   {Does this patient have ATRIAL FIBRILLATION?:[id number]} No BP recorded.  {Refresh Note OR Click here to enter BP  :1}***       Physical Exam:   VS:  LMP 09/14/2019 (Exact Date)    Wt Readings from Last 3 Encounters:  04/24/23 157 lb (71.2 kg)  04/15/23 162 lb 11.2 oz (73.8 kg)  12/31/22 161 lb (73 kg)    GEN: Well nourished, well developed in no acute distress NECK: No JVD; No carotid bruits CARDIAC: ***RRR, no murmurs, rubs, gallops RESPIRATORY:  Clear to auscultation without rales, wheezing or rhonchi  ABDOMEN: Soft, non-tender, non-distended EXTREMITIES:  No edema; No acute deformity ***     Assessment and Plan:  Hypertension  Palpitations              {Are you ordering a CV Procedure (e.g. stress test, cath, DCCV, TEE, etc)?   Press F2        :[id number]}  Dispo:  No follow-ups on file.  Signed, Denyce Robert, NP

## 2023-07-30 ENCOUNTER — Other Ambulatory Visit: Payer: Self-pay | Admitting: Obstetrics & Gynecology

## 2023-07-30 DIAGNOSIS — Z1231 Encounter for screening mammogram for malignant neoplasm of breast: Secondary | ICD-10-CM

## 2023-07-31 ENCOUNTER — Ambulatory Visit

## 2023-07-31 DIAGNOSIS — Z1231 Encounter for screening mammogram for malignant neoplasm of breast: Secondary | ICD-10-CM | POA: Diagnosis not present

## 2023-09-09 ENCOUNTER — Other Ambulatory Visit (HOSPITAL_BASED_OUTPATIENT_CLINIC_OR_DEPARTMENT_OTHER): Payer: Self-pay | Admitting: Obstetrics & Gynecology

## 2023-09-09 ENCOUNTER — Telehealth (HOSPITAL_BASED_OUTPATIENT_CLINIC_OR_DEPARTMENT_OTHER): Payer: Self-pay | Admitting: *Deleted

## 2023-09-09 DIAGNOSIS — G4709 Other insomnia: Secondary | ICD-10-CM

## 2023-09-09 MED ORDER — ALPRAZOLAM 0.5 MG PO TABS: 0.5000 mg | ORAL_TABLET | Freq: Three times a day (TID) | ORAL | 0 refills | Status: DC | PRN

## 2023-09-09 NOTE — Addendum Note (Signed)
 Addended by: ELANA SOTERO RAMAN on: 09/09/2023 02:19 PM   Modules accepted: Orders

## 2023-09-09 NOTE — Telephone Encounter (Signed)
 Called patient and left  a message to please call the office back to schedule appointment for August.

## 2023-09-09 NOTE — Telephone Encounter (Signed)
 Pt scheduled for annual exam Sept 2025.  Please advise on a refill for generic Xanax  . Sotero CMA

## 2023-09-09 NOTE — Telephone Encounter (Signed)
 Last annual exam 10/2022 w/ #30 with 0 RFs given then.  NO annual exam scheduled yet this year. Will have front desk call and schedule annual exam.  Please advise on Refill.  Sotero CMA

## 2023-09-09 NOTE — Telephone Encounter (Signed)
 Appt made Sotero CMA

## 2023-10-12 ENCOUNTER — Other Ambulatory Visit (HOSPITAL_BASED_OUTPATIENT_CLINIC_OR_DEPARTMENT_OTHER): Payer: Self-pay | Admitting: Obstetrics & Gynecology

## 2023-10-12 DIAGNOSIS — Z79890 Hormone replacement therapy (postmenopausal): Secondary | ICD-10-CM

## 2023-10-28 ENCOUNTER — Encounter: Payer: Self-pay | Admitting: Family Medicine

## 2023-10-28 ENCOUNTER — Ambulatory Visit: Admitting: Family Medicine

## 2023-10-28 VITALS — BP 138/60 | HR 66 | Temp 98.2°F | Wt 160.5 lb

## 2023-10-28 DIAGNOSIS — J309 Allergic rhinitis, unspecified: Secondary | ICD-10-CM

## 2023-10-28 DIAGNOSIS — R0982 Postnasal drip: Secondary | ICD-10-CM | POA: Diagnosis not present

## 2023-10-28 MED ORDER — AZITHROMYCIN 250 MG PO TABS: ORAL_TABLET | ORAL | 0 refills | Status: AC

## 2023-10-28 MED ORDER — AZELASTINE HCL 0.1 % NA SOLN: 2.0000 | Freq: Two times a day (BID) | NASAL | 5 refills | Status: DC

## 2023-10-28 NOTE — Progress Notes (Signed)
 Established Patient Office Visit  Subjective   Patient ID: Lori Love, female    DOB: April 17, 1960  Age: 63 y.o. MRN: [id number]  Chief Complaint  Patient presents with   Nasal Congestion   Cough    HPI   Lori Love is seen with some recent increased postnasal drip symptoms and cough for past couple weeks.  She has probably multiple allergies and has noted especially increased allergy type symptoms when she is around dogs.  There is been also recent possible mold exposure.  She and her husband are leaving on 2-week cruise to [place] and couple weeks since she understandably wants to be as clear as she can before then.  She already takes Xyzal  and Nasacort  regularly for allergy symptoms.  Still has some postnasal drip.  History of right vocal cord paralysis.  Fortunately, she has gotten back her ability to speak better but has been advised to avoid forceful coughing or frequent clearing of throat.  Denies any current fever.  No history of smoking.  Past Medical History:  Diagnosis Date   Arthritis    thumbs   Hypertension    followed by pcp and cardiology    Neck pain, chronic    Palpitations    cardiologist-- dr pietro---  ETT 03-06-2018 in epic normal with no significant arrythmia;  echo 05-16-2017 normal ;  event monitor 05-16-2017 SR with PVCs   Seasonal allergies    Seasonal asthma    followd by pcp   Past Surgical History:  Procedure Laterality Date   APPENDECTOMY  1996   DILATATION & CURETTAGE/HYSTEROSCOPY WITH MYOSURE N/A 11/03/2019   Procedure: DILATATION & CURETTAGE/HYSTEROSCOPY;  Surgeon: Cleotilde Ronal RAMAN, MD;  Location: [facility];  Service: Gynecology;  Laterality: N/A;   DILATION AND CURETTAGE OF UTERUS  2003   retained placenta post SVD   PLACEMENT OF BREAST IMPLANTS Bilateral 1998 approx.   TONSILLECTOMY AND ADENOIDECTOMY  child    reports that she has never smoked. She has never used smokeless tobacco. She reports current alcohol use of about 7.0 -  14.0 standard drinks of alcohol per week. She reports that she does not use drugs. family history includes Dementia in her father; Hypertension in her father and mother; Stroke in her mother. Allergies  Allergen Reactions   Penicillins Hives   Sulfa Antibiotics Rash    Review of Systems  Constitutional:  Negative for chills and fever.  HENT:  Positive for congestion.   Respiratory:  Positive for cough.       Objective:     BP 138/60   Pulse 66   Temp 98.2 F (36.8 C) (Oral)   Wt 160 lb 8 oz (72.8 kg)   LMP 09/14/2019 (Exact Date)   SpO2 98%   BMI 26.71 kg/m  BP Readings from Last 3 Encounters:  10/28/23 138/60  05/01/23 (!) 160/88  04/26/23 120/80   Wt Readings from Last 3 Encounters:  10/28/23 160 lb 8 oz (72.8 kg)  04/24/23 157 lb (71.2 kg)  04/15/23 162 lb 11.2 oz (73.8 kg)      Physical Exam Vitals reviewed.  Constitutional:      General: [name] not in acute distress.    Appearance: [name] not ill-appearing.  HENT:     Right Ear: Tympanic membrane normal.     Left Ear: Tympanic membrane normal.     Mouth/Throat:     Mouth: Mucous membranes are moist.     Pharynx: Oropharynx is clear.  Cardiovascular:  Rate and Rhythm: Normal rate and regular rhythm.  Pulmonary:     Effort: Pulmonary effort is normal.     Breath sounds: Normal breath sounds. No wheezing or rales.  Musculoskeletal:     Cervical back: Neck supple.  Lymphadenopathy:     Cervical: No cervical adenopathy.  Neurological:     Mental Status: [name] alert.      No results found for any visits on 10/28/23.    The 10-year ASCVD risk score (Arnett DK, et al., 2019) is: 4.9%    Assessment & Plan:   2-week history of postnasal drip symptoms.  She has had some associated cough.  Suspect allergic in origin.  Already on Xyzal  and Nasacort .  Consider trial of Astelin  nasal 1 to 2 sprays per nostril twice daily as needed.  Follow-up promptly for any fever or worsening cough Wolm Scarlet, MD

## 2023-11-27 ENCOUNTER — Encounter: Payer: Self-pay | Admitting: Family Medicine

## 2023-11-27 ENCOUNTER — Ambulatory Visit (HOSPITAL_BASED_OUTPATIENT_CLINIC_OR_DEPARTMENT_OTHER): Admitting: Obstetrics & Gynecology

## 2023-11-27 ENCOUNTER — Ambulatory Visit: Payer: Self-pay | Admitting: Allergy

## 2023-11-27 ENCOUNTER — Encounter (HOSPITAL_BASED_OUTPATIENT_CLINIC_OR_DEPARTMENT_OTHER): Payer: Self-pay | Admitting: Obstetrics & Gynecology

## 2023-11-27 ENCOUNTER — Other Ambulatory Visit (HOSPITAL_COMMUNITY)
Admission: RE | Admit: 2023-11-27 | Discharge: 2023-11-27 | Disposition: A | Discharge status: Home / Self Care | Source: Ambulatory Visit | Attending: Obstetrics & Gynecology | Admitting: Obstetrics & Gynecology

## 2023-11-27 VITALS — BP 160/84 | HR 63 | Ht 65.0 in | Wt 157.8 lb

## 2023-11-27 DIAGNOSIS — G4709 Other insomnia: Secondary | ICD-10-CM

## 2023-11-27 DIAGNOSIS — I10 Essential (primary) hypertension: Secondary | ICD-10-CM

## 2023-11-27 DIAGNOSIS — F419 Anxiety disorder, unspecified: Secondary | ICD-10-CM

## 2023-11-27 DIAGNOSIS — Z1331 Encounter for screening for depression: Secondary | ICD-10-CM

## 2023-11-27 DIAGNOSIS — Z124 Encounter for screening for malignant neoplasm of cervix: Secondary | ICD-10-CM

## 2023-11-27 DIAGNOSIS — Z1211 Encounter for screening for malignant neoplasm of colon: Secondary | ICD-10-CM

## 2023-11-27 DIAGNOSIS — Z01419 Encounter for gynecological examination (general) (routine) without abnormal findings: Secondary | ICD-10-CM

## 2023-11-27 DIAGNOSIS — Z1151 Encounter for screening for human papillomavirus (HPV): Secondary | ICD-10-CM

## 2023-11-27 DIAGNOSIS — B009 Herpesviral infection, unspecified: Secondary | ICD-10-CM

## 2023-11-27 MED ORDER — ALPRAZOLAM 0.5 MG PO TABS: 0.5000 mg | ORAL_TABLET | Freq: Every day | ORAL | 0 refills | Status: AC | PRN

## 2023-11-27 NOTE — Progress Notes (Signed)
 ANNUAL EXAM Patient name: Lori Love MRN [id number]  Date of birth: [birth year] Chief Complaint:   Annual Exam  History of Present Illness:   Lori Love is a 63 y.o. [phone] Caucasian female being seen today for a routine annual exam.  Just go back from a cruise to [place].  This was an [place] cruise.  She felt she was a little young for this cruise line.    On HRT.  RF just done in July.  Denies vaginal bleeding.    Had influenza this year.  Then she has issues with asthma.    Patient's last menstrual period was 09/14/2019 (exact date).   Last pap 09/22/2020. Results were: NILM w/ HRHPV negative. H/O abnormal pap: no Last mammogram: 07/31/2023. Results were: normal. Family h/o breast cancer: no Last colonoscopy: Has not had one performed. Cologuard negative 2022.       11/27/2023   11:08 AM 04/15/2023   11:02 AM 11/05/2022    2:30 PM 08/15/2022   12:44 PM 07/18/2022    4:43 PM  Depression screen PHQ 2/9  Decreased Interest 0 1 0 0 0  Down, Depressed, Hopeless 0 1 0 0 0  PHQ - 2 Score 0 2 0 0 0  Altered sleeping  0     Change in appetite  0     Feeling bad or failure about yourself   0     Trouble concentrating  0     Moving slowly or fidgety/restless  0     Suicidal thoughts  0     PHQ-9 Score  2           04/15/2023   11:03 AM  GAD 7 : Generalized Anxiety Score  Nervous, Anxious, on Edge 0  Control/stop worrying 0  Worry too much - different things 0  Trouble relaxing 1  Restless 0  Easily annoyed or irritable 1  Afraid - awful might happen 0  Total GAD 7 Score 2  Anxiety Difficulty Not difficult at all     Review of Systems:   Pertinent items are noted in HPI Denies any urinary or bowel changes, pelvic pain.   Pertinent History Reviewed:  Reviewed past medical,surgical, social and family history.  Reviewed problem list, medications and allergies. Physical Assessment:   Vitals:   11/27/23 1054  BP: (!) 160/84  Pulse: 63  SpO2: 100%  Weight: 157 lb  12.8 oz (71.6 kg)  Height: 5' 5 (1.651 m)  Body mass index is 26.26 kg/m.        Physical Examination:   General appearance - well appearing, and in no distress  Mental status - alert, oriented to person, place, and time  Psych:  She has a normal mood and affect  Skin - warm and dry, normal color, no suspicious lesions noted  Chest - effort normal, all lung fields clear to auscultation bilaterally  Heart - normal rate and regular rhythm  Neck:  midline trachea, no thyromegaly or nodules  Breasts - breasts appear normal, no suspicious masses, no skin or nipple changes or  axillary nodes  Abdomen - soft, nontender, nondistended, no masses or organomegaly  Pelvic - VULVA: normal appearing vulva with no masses, tenderness or lesions   VAGINA: normal appearing vagina with normal color and discharge, no lesions   CERVIX: normal appearing cervix without discharge or lesions, no CMT  Thin prep pap is   UTERUS: uterus is felt to be normal size, shape, consistency and nontender   ADNEXA:  No adnexal masses or tenderness noted.  Rectal - normal rectal, good sphincter tone, no masses felt.   Extremities:  No swelling or varicosities noted  Chaperone present for exam  No results found for this or any previous visit (from the past 24 hours).  Assessment & Plan:  1. Well woman exam with routine gynecological exam (Primary) - Pap smear with HR HPV obtained today - Mammogram 08/05/2023 - Colonoscopy referral placed - lab work done with PCP - vaccines reviewed/updated  2. HSV infection - does not need valtrex  rx  3. Cervical cancer screening - Cytology - PAP( [facility])  4. Colon cancer screening - Ambulatory referral to Gastroenterology  5. Primary hypertension - seeing C[facility]shaw in October  6. Other insomnia - ALPRAZolam  (XANAX ) 0.5 MG tablet; Take 1 tablet (0.5 mg total) by mouth daily as needed for anxiety.  Dispense: 30 tablet; Refill: 0   Orders Placed This Encounter   Procedures   Ambulatory referral to Gastroenterology    Meds:  Meds ordered this encounter  Medications   ALPRAZolam  (XANAX ) 0.5 MG tablet    Sig: Take 1 tablet (0.5 mg total) by mouth daily as needed for anxiety.    Dispense:  30 tablet    Refill:  0    Not to exceed 5 additional fills before 05/04/2023 DX Code Needed  .    Follow-up: No follow-ups on file.  Ronal GORMAN Pinal, MD 11/27/2023 12:10 PM

## 2023-12-03 ENCOUNTER — Encounter (HOSPITAL_BASED_OUTPATIENT_CLINIC_OR_DEPARTMENT_OTHER): Payer: Self-pay | Admitting: Obstetrics & Gynecology

## 2023-12-03 ENCOUNTER — Other Ambulatory Visit: Payer: Self-pay

## 2023-12-03 ENCOUNTER — Ambulatory Visit (INDEPENDENT_AMBULATORY_CARE_PROVIDER_SITE_OTHER): Payer: Self-pay | Admitting: Allergy

## 2023-12-03 ENCOUNTER — Encounter: Payer: Self-pay | Admitting: Allergy

## 2023-12-03 VITALS — BP 108/80 | HR 66 | Temp 98.3°F | Resp 18 | Ht 64.0 in | Wt 157.8 lb

## 2023-12-03 DIAGNOSIS — J4520 Mild intermittent asthma, uncomplicated: Secondary | ICD-10-CM

## 2023-12-03 DIAGNOSIS — J3089 Other allergic rhinitis: Secondary | ICD-10-CM

## 2023-12-03 LAB — CYTOLOGY - PAP
Comment: NEGATIVE
Diagnosis: UNDETERMINED — AB
High risk HPV: NEGATIVE

## 2023-12-03 MED ORDER — IPRATROPIUM BROMIDE 0.06 % NA SOLN: 1.0000 | Freq: Three times a day (TID) | NASAL | 5 refills | Status: AC | PRN

## 2023-12-03 NOTE — Progress Notes (Signed)
 New Patient Note  RE: Lori Love MRN: [id number] DOB: Jul 08, 1960 Date of Office Visit: 12/03/2023  Consult requested by: Micheal Wolm ORN, MD Primary care provider: Micheal Wolm ORN, MD  Chief Complaint: Establish Care (Patient moved from [place], [place] to [facility] 2007 and had first appointment with [facility] 2008. Patient states she has not seen them since. Paralyzed right vocal cord.)  History of Present Illness: I had the pleasure of seeing Lakina Mcintire for initial evaluation at the [facility] on 12/03/2023. [name] a 63 y.o. female, who is self-referred here for the evaluation of asthma.  Discussed the use of AI scribe software for clinical note transcription with the patient, who gave verbal consent to proceed.    She has a history of asthma and allergies that began in 2008 after moving to [place]. Her asthma is typically triggered during spring and fall. Her symptoms have been managed with Nasacort , Xyzal , and previously Singulair , which she discontinued. She uses albuterol  as needed, though she has not used it since February. She prefers Xopenex , but insurance does not cover it.  She reports that her symptoms worsen when her dog, Marinell, who has a chronic skin condition causing excessive dander, has flare-ups. Her asthma symptoms, including chronic coughing and postnasal drip, worsen with Jack's flare-ups. The cough produces 'white clear' phlegm. No chest tightness, shortness of breath, or wheezing. She manages symptoms by keeping Marinell out of her bedroom, using a Dyson HEPA filter, and occasionally wearing a mask indoors.  In February, she experienced a significant asthma exacerbation after contracting the flu, requiring prednisone  treatment. This was her first need for prednisone  since 2008. She also has a history of a paralyzed right vocal cord, diagnosed after excessive coughing. Her left vocal cord has compensated, allowing her to speak normally.  She  denies any history of smoking and reports occasional heartburn, for which she takes a generic acid reducer as needed. No known food allergies. She lives in a townhouse and uses a HEPA filter to manage indoor allergens.  Her family history is negative for asthma, allergies, or eczema. She has not had allergy testing since 2008.     She reports symptoms of PND, whitish/clear coughing for 15+ years. Current medications include levoalbuterol prn which help. She reports using aerochamber with inhalers. She tried the following inhalers: albuterol . Main triggers are infections, exertion and possibly pet exposure. In the last month, frequency of symptoms: <1x/week. Frequency of nocturnal symptoms: 0x/month. Frequency of SABA use: <1x/week. In the last 12 months, emergency room visits/urgent care visits/doctor office visits or hospitalizations due to respiratory issues: 1. In the last 12 months, oral steroids courses: one. Lifetime history of hospitalization for respiratory issues: no. She was evaluated by allergist in the past. Smoking exposure: denies. History of reflux: sometimes.  She reports symptoms of PND, nasal congestion Symptoms have been going on for many years. The symptoms are present all year around with worsening in spring and fall. Anosmia: diminished sense of smell. Headache: no. She has used Nasacort , Xyzal , azelastine   with some improvement in symptoms. Previous work up includes: skin testing in 2008 was positive to trees, weeds, grass, mold. Previous ENT evaluation: yes and finished speech therapy.   05/28/2023 ENT visit: I have recommended: Medical speech evaluation  Vocal Fold Paresis and Paralysis   05/01/2023 CXR: IMPRESSION: No radiographic evidence of acute cardiopulmonary disease.  Assessment and Plan: [name] is a 64 y.o. female with: Mild intermittent asthma without complication Asthma exacerbated by  dog dander exposure, particularly from her dog with a skin condition.  Symptoms include chronic cough and postnasal drip. No recent albuterol  use except during flu. No wheezing, chest tightness, or shortness of breath. 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  Saw ENT for this in the past. Today's spirometry was normal. May use levo/albuterol  rescue inhaler 2 puffs every 4 to 6 hours as needed for shortness of breath, chest tightness, coughing, and wheezing. Use with spacer.  Monitor frequency of use - if you need to use it more than twice per week on a consistent basis let us  know.   Other allergic rhinitis Current dog exacerbates her symptoms. 2008 skin testing positive to trees, weed, grass and borderline to mold. No prior AIT. Did not like the taste of azelastine .  Start environmental control measures as below. Use over the counter antihistamines such as Zyrtec (cetirizine), Claritin (loratadine), Allegra (fexofenadine), or Xyzal  (levocetirizine) daily as needed.  May switch antihistamines every few months. Hold 3 days before skin testing.  Use Atrovent  (ipratropium) 0.06% 1-2 sprays per nostril three times a day as needed for runny nose/drainage. Return for allergy testing (1-55).   Return for Skin testing.  Meds ordered this encounter  Medications   ipratropium (ATROVENT ) 0.06 % nasal spray    Sig: Place 1-2 sprays into both nostrils 3 (three) times daily as needed (runny nose/drainage).    Dispense:  15 mL    Refill:  5   Lab Orders  No laboratory test(s) ordered today    Other allergy screening: Food allergy: no Medication allergy: yes Hymenoptera allergy: no Urticaria: no Eczema:no History of recurrent infections suggestive of immunodeficency: no  Diagnostics: Spirometry:  Tracings reviewed. Her effort: Good reproducible efforts. FVC: 3.05L FEV1: 2.32L, 93% predicted FEV1/FVC ratio: 76% Interpretation: Spirometry consistent with normal pattern.  Please see scanned spirometry results for details.  Results discussed with  patient/family.   Past Medical History: Patient Active Problem List   Diagnosis Date Noted   Lateral epicondylitis of right elbow 03/03/2015   Right elbow pain 03/03/2015   Vitamin D  deficiency 11/25/2009   Hypertension 04/06/2009   Asthma, intermittent with acute exacerbation 04/06/2009   Asthma 10/21/2008   Positive ANA (antinuclear antibody) 10/21/2008   Anxiety 04/17/2007   Palpitations 04/10/2007   Scoliosis 04/07/2007   Past Medical History:  Diagnosis Date   Arthritis    thumbs   Hypertension    followed by pcp and cardiology    Neck pain, chronic    Palpitations    cardiologist-- dr pietro---  ETT 03-06-2018 in epic normal with no significant arrythmia;  echo 05-16-2017 normal ;  event monitor 05-16-2017 SR with PVCs   Paralysis of right vocal cord    saw pulmonologist, thinks occured after influenza   Seasonal allergies    Seasonal asthma    followd by pcp   Past Surgical History: Past Surgical History:  Procedure Laterality Date   APPENDECTOMY  1996   DILATATION & CURETTAGE/HYSTEROSCOPY WITH MYOSURE N/A 11/03/2019   Procedure: DILATATION & CURETTAGE/HYSTEROSCOPY;  Surgeon: Cleotilde Ronal RAMAN, MD;  Location: [facility] ;  Service: Gynecology;  Laterality: N/A;   DILATION AND CURETTAGE OF UTERUS  2003   retained placenta post SVD   PLACEMENT OF BREAST IMPLANTS Bilateral 1998 approx.   TONSILLECTOMY AND ADENOIDECTOMY  child   Medication List:  Current Outpatient Medications  Medication Sig Dispense Refill   albuterol  (VENTOLIN  HFA) 108 (90 Base) MCG/ACT inhaler Inhale 1 to 2 puffs every 4  hours as needed for cough/wheeze 16 g 0   ALPRAZolam  (XANAX ) 0.5 MG tablet Take 1 tablet (0.5 mg total) by mouth daily as needed for anxiety. 30 tablet 0   Estradiol -Norethindrone  Acet 0.5-0.1 MG tablet TAKE 1 TABLET BY MOUTH EVERY DAY 84 tablet 3   ibuprofen (ADVIL,MOTRIN) 200 MG tablet Take 200 mg by mouth every 6 (six) hours as needed for pain.      ipratropium  (ATROVENT ) 0.06 % nasal spray Place 1-2 sprays into both nostrils 3 (three) times daily as needed (runny nose/drainage). 15 mL 5   metoprolol  succinate (TOPROL -XL) 25 MG 24 hr tablet Take 1 tablet (25 mg total) by mouth daily. 90 tablet 3   Multiple Vitamin (MULTIVITAMIN) tablet Take 1 tablet by mouth daily.     triamcinolone  (NASACORT ) 55 MCG/ACT nasal inhaler Place 2 sprays into the nose daily as needed.      valsartan  (DIOVAN ) 80 MG tablet Take 1 tablet (80 mg total) by mouth daily. 90 tablet 3   No current facility-administered medications for this visit.   Allergies: Allergies  Allergen Reactions   Penicillins Hives   Sulfa Antibiotics Rash   Social History: Social History   Socioeconomic History   Marital status: Married    Spouse name: Not on file   Number of children: 3   Years of education: Not on file   Highest education level: Not on file  Occupational History    Comment: Realtor  Tobacco Use   Smoking status: Never   Smokeless tobacco: Never  Vaping Use   Vaping status: Never Used  Substance and Sexual Activity   Alcohol use: Yes    Alcohol/week: 7.0 - 14.0 standard drinks of alcohol    Types: 7 - 14 Glasses of wine per week   Drug use: Never   Sexual activity: Yes    Birth control/protection: Post-menopausal    Comment: vasectomy   Other Topics Concern   Not on file  Social History Narrative   Not on file   Social Drivers of Health   Financial Resource Strain: Not on file  Food Insecurity: Not on file  Transportation Needs: Not on file  Physical Activity: Not on file  Stress: Not on file  Social Connections: Not on file   Lives in a townhouse. Smoking: denies Occupation: Chief Strategy Officer HistorySurveyor, minerals in the house: no Engineer, civil (consulting) in the family room: no Carpet in the bedroom: yes Heating: gas Cooling: central Pet: yes 1 dog x 4 yrs  Family History: Family History  Problem Relation Age of Onset   Hypertension Mother     Stroke Mother    Hypertension Father    Dementia Father    Problem                               Relation Asthma                                   no Eczema                                no Food allergy                          no Allergic rhino conjunctivitis  no  Review of Systems  Constitutional:  Negative for appetite change, chills, fever and unexpected weight change.  HENT:  Positive for postnasal drip. Negative for congestion and rhinorrhea.   Eyes:  Negative for itching.  Respiratory:  Positive for cough. Negative for chest tightness, shortness of breath and wheezing.   Cardiovascular:  Negative for chest pain.  Gastrointestinal:  Negative for abdominal pain.  Genitourinary:  Negative for difficulty urinating.  Skin:  Negative for rash.  Neurological:  Negative for headaches.    Objective: BP 108/80 (BP Location: Left Arm, Patient Position: Sitting, Cuff Size: Normal)   Pulse 66   Temp 98.3 F (36.8 C) (Temporal)   Resp 18   Ht 5' 4 (1.626 m)   Wt 157 lb 12.8 oz (71.6 kg)   LMP 09/14/2019 (Exact Date)   SpO2 99%   BMI 27.09 kg/m  Body mass index is 27.09 kg/m. Physical Exam Vitals and nursing note reviewed.  Constitutional:      Appearance: Normal appearance. [name] well-developed.  HENT:     Head: Normocephalic and atraumatic.     Right Ear: Tympanic membrane and external ear normal.     Left Ear: Tympanic membrane and external ear normal.     Nose: Nose normal.     Mouth/Throat:     Mouth: Mucous membranes are moist.     Pharynx: Oropharynx is clear.  Eyes:     Conjunctiva/sclera: Conjunctivae normal.  Cardiovascular:     Rate and Rhythm: Normal rate and regular rhythm.     Heart sounds: Normal heart sounds. No murmur heard.    No friction rub. No gallop.  Pulmonary:     Effort: Pulmonary effort is normal.     Breath sounds: Normal breath sounds. No wheezing, rhonchi or rales.  Musculoskeletal:     Cervical back: Neck supple.  Skin:     General: Skin is warm.     Findings: No rash.  Neurological:     Mental Status: [name] alert and oriented to person, place, and time.  Psychiatric:        Behavior: Behavior normal.    The plan was reviewed with the patient/family, and all questions/concerned were addressed.  It was my pleasure to see Sahaana today and participate in her care. Please feel free to contact me with any questions or concerns.  Sincerely,  Orlan Cramp, DO Allergy & Immunology  [facility]  [facility] office: [phone] [facility] office: [phone]

## 2023-12-03 NOTE — Patient Instructions (Addendum)
 Asthma  Normal breathing test today.  May use levo/albuterol  rescue inhaler 2 puffs every 4 to 6 hours as needed for shortness of breath, chest tightness, coughing, and wheezing. Use with spacer.  Monitor frequency of use - if you need to use it more than twice per week on a consistent basis let us  know.  Breathing control goals:  Full participation in all desired activities (may need albuterol  before activity) Albuterol  use two times or less a week on average (not counting use with activity) Cough interfering with sleep two times or less a month Oral steroids no more than once a year No hospitalizations   Environmental allergies Start environmental control measures as below. Use over the counter antihistamines such as Zyrtec (cetirizine), Claritin (loratadine), Allegra (fexofenadine), or Xyzal  (levocetirizine) daily as needed.  May switch antihistamines every few months. Hold 3 days before skin testing.  Use Atrovent  (ipratropium) 0.06% 1-2 sprays per nostril three times a day as needed for runny nose/drainage.  Skin testing instructions:  Return for allergy skin testing. Will make additional recommendations based on results. Make sure you don't take any antihistamines for 3 days before the skin testing appointment. Don't put any lotion on the back and arms on the day of testing.  Must be in good health and not ill. No vaccines/injections/antibiotics within the past 7 days.  Plan on being here for 30-60 minutes.   Return for Skin testing. Or sooner if needed.   Pet Allergen Avoidance: Contrary to popular opinion, there are no "hypoallergenic" breeds of dogs or cats. That is because people are not allergic to an animal's hair, but to an allergen found in the animal's saliva, dander (dead skin flakes) or urine. Pet allergy symptoms typically occur within minutes. For some people, symptoms can build up and become most severe 8 to 12 hours after contact with the animal. People with severe  allergies can experience reactions in public places if dander has been transported on the pet owners' clothing. Keeping an animal outdoors is only a partial solution, since homes with pets in the yard still have higher concentrations of animal allergens. Before getting a pet, ask your allergist to determine if you are allergic to animals. If your pet is already considered part of your family, try to minimize contact and keep the pet out of the bedroom and other rooms where you spend a great deal of time. As with dust mites, vacuum carpets often or replace carpet with a hardwood floor, tile or linoleum. High-efficiency particulate air (HEPA) cleaners can reduce allergen levels over time. While dander and saliva are the source of cat and dog allergens, urine is the source of allergens from rabbits, hamsters, mice and israel pigs; so ask a non-allergic family member to clean the animal's cage. If you have a pet allergy, talk to your allergist about the potential for allergy immunotherapy (allergy shots). This strategy can often provide long-term relief.

## 2023-12-03 NOTE — Telephone Encounter (Signed)
 Called pt about her pap results.  ASCUS with neg HR HPV pap smear results discussed.  Pt aware this is not an abnormal pap smear by current guidelines.  No follow up needed at this time.  Routine pap smear screening recommended.  Questions answered.

## 2023-12-04 ENCOUNTER — Ambulatory Visit (HOSPITAL_BASED_OUTPATIENT_CLINIC_OR_DEPARTMENT_OTHER): Payer: Self-pay | Admitting: Obstetrics & Gynecology

## 2023-12-12 ENCOUNTER — Ambulatory Visit: Admitting: Allergy

## 2023-12-19 ENCOUNTER — Ambulatory Visit: Admitting: Allergy

## 2023-12-19 ENCOUNTER — Other Ambulatory Visit: Payer: Self-pay | Admitting: Cardiology

## 2023-12-19 DIAGNOSIS — I10 Essential (primary) hypertension: Secondary | ICD-10-CM

## 2023-12-19 NOTE — Progress Notes (Signed)
 HPI: FU palpitations. Echo 2/19 showed normal LV function. Event monitor 2/19 showed sinus with PVCs.  Exercise treadmill December 2019 showed a hypertensive response, no significant arrhythmias and no diagnostic ST changes.  Calcium score May 2023 0.  Since last seen, the patient denies any dyspnea on exertion, orthopnea, PND, pedal edema, palpitations, syncope or chest pain.   Current Outpatient Medications  Medication Sig Dispense Refill   albuterol  (VENTOLIN  HFA) 108 (90 Base) MCG/ACT inhaler Inhale 1 to 2 puffs every 4 hours as needed for cough/wheeze 16 g 0   ALPRAZolam  (XANAX ) 0.5 MG tablet Take 1 tablet (0.5 mg total) by mouth daily as needed for anxiety. 30 tablet 0   Estradiol -Norethindrone  Acet 0.5-0.1 MG tablet TAKE 1 TABLET BY MOUTH EVERY DAY 84 tablet 3   ibuprofen (ADVIL,MOTRIN) 200 MG tablet Take 200 mg by mouth every 6 (six) hours as needed for pain.      ipratropium (ATROVENT ) 0.06 % nasal spray Place 1-2 sprays into both nostrils 3 (three) times daily as needed (runny nose/drainage). 15 mL 5   metoprolol  succinate (TOPROL -XL) 25 MG 24 hr tablet TAKE 1 TABLET (25 MG TOTAL) BY MOUTH DAILY. 90 tablet 0   Multiple Vitamin (MULTIVITAMIN) tablet Take 1 tablet by mouth daily.     triamcinolone  (NASACORT ) 55 MCG/ACT nasal inhaler Place 2 sprays into the nose daily as needed.      valsartan  (DIOVAN ) 80 MG tablet TAKE 1 TABLET BY MOUTH EVERY DAY 90 tablet 0   No current facility-administered medications for this visit.     Past Medical History:  Diagnosis Date   Arthritis    thumbs   Hypertension    followed by pcp and cardiology    Neck pain, chronic    Palpitations    cardiologist-- dr pietro---  ETT 03-06-2018 in epic normal with no significant arrythmia;  echo 05-16-2017 normal ;  event monitor 05-16-2017 SR with PVCs   Paralysis of right vocal cord    saw pulmonologist, thinks occured after influenza   Seasonal allergies    Seasonal asthma    followd by pcp     Past Surgical History:  Procedure Laterality Date   APPENDECTOMY  1996   DILATATION & CURETTAGE/HYSTEROSCOPY WITH MYOSURE N/A 11/03/2019   Procedure: DILATATION & CURETTAGE/HYSTEROSCOPY;  Surgeon: Cleotilde Ronal RAMAN, MD;  Location: [facility];  Service: Gynecology;  Laterality: N/A;   DILATION AND CURETTAGE OF UTERUS  2003   retained placenta post SVD   PLACEMENT OF BREAST IMPLANTS Bilateral 1998 approx.   TONSILLECTOMY AND ADENOIDECTOMY  child    Social History   Socioeconomic History   Marital status: Married    Spouse name: Not on file   Number of children: 3   Years of education: Not on file   Highest education level: Not on file  Occupational History    Comment: Realtor  Tobacco Use   Smoking status: Never   Smokeless tobacco: Never  Vaping Use   Vaping status: Never Used  Substance and Sexual Activity   Alcohol use: Yes    Alcohol/week: 7.0 - 14.0 standard drinks of alcohol    Types: 7 - 14 Glasses of wine per week   Drug use: Never   Sexual activity: Yes    Birth control/protection: Post-menopausal    Comment: vasectomy   Other Topics Concern   Not on file  Social History Narrative   Not on file   Social Drivers of Health   Financial  Resource Strain: Not on file  Food Insecurity: Not on file  Transportation Needs: Not on file  Physical Activity: Not on file  Stress: Not on file  Social Connections: Not on file  Intimate Partner Violence: Not on file    Family History  Problem Relation Age of Onset   Hypertension Mother    Stroke Mother    Hypertension Father    Dementia Father     ROS: no fevers or chills, productive cough, hemoptysis, dysphasia, odynophagia, melena, hematochezia, dysuria, hematuria, rash, seizure activity, orthopnea, PND, pedal edema, claudication. Remaining systems are negative.  Physical Exam: Well-developed well-nourished in no acute distress.  Skin is warm and dry.  HEENT is normal.  Neck is supple.  Chest  is clear to auscultation with normal expansion.  Cardiovascular exam is regular rate and rhythm.  Abdominal exam nontender or distended. No masses palpated. Extremities show no edema. neuro grossly intact  EKG Interpretation Date/Time:  Thursday January 02 2024 14:44:37 EDT Ventricular Rate:  59 PR Interval:  140 QRS Duration:  82 QT Interval:  456 QTC Calculation: 451 R Axis:   69  Text Interpretation: Sinus bradycardia Confirmed by Pietro Rogue ([id number]) on 01/02/2024 2:47:09 PM    A/P  1 palpitations-symptoms are controlled at present.  Will continue beta-blocker.  2 hypertension-patient's blood pressure is controlled today.  Continue present medications and follow.   Rogue Pietro, MD

## 2023-12-19 NOTE — Progress Notes (Deleted)
   Skin testing note  RE: Lori Love MRN: [id number] DOB: 06/05/1960 Date of Office Visit: 12/19/2023  Referring provider: Micheal Wolm ORN, MD Primary care provider: Micheal Wolm ORN, MD  Chief Complaint: skin testing  History of Present Illness: I had the pleasure of seeing Lori Love for a skin testing visit at the [facility] on 12/19/2023. [name] a 63 y.o. female, who is being followed for asthma, allergic rhinitis. Her previous allergy office visit was on 12/03/2023 with Dr. Luke. Today is a skin testing visit.   Discussed the use of AI scribe software for clinical note transcription with the patient, who gave verbal consent to proceed.  History of Present Illness             *** Assessment and Plan: Lori Love is a 63 y.o. female with: Mild intermittent asthma without complication Asthma exacerbated by dog dander exposure, particularly from her dog with a skin condition. Symptoms include chronic cough and postnasal drip. No recent albuterol  use except during flu. No wheezing, chest tightness, or shortness of breath. 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  Saw ENT for this in the past. Today's spirometry was normal. May use levo/albuterol  rescue inhaler 2 puffs every 4 to 6 hours as needed for shortness of breath, chest tightness, coughing, and wheezing. Use with spacer.  Monitor frequency of use - if you need to use it more than twice per week on a consistent basis let us  know.    Other allergic rhinitis Current dog exacerbates her symptoms. 2008 skin testing positive to trees, weed, grass and borderline to mold. No prior AIT. Did not like the taste of azelastine .  Start environmental control measures as below. Use over the counter antihistamines such as Zyrtec (cetirizine), Claritin (loratadine), Allegra (fexofenadine), or Xyzal  (levocetirizine) daily as needed.  May switch antihistamines every few months. Hold 3 days before skin testing.   Use Atrovent  (ipratropium) 0.06% 1-2 sprays per nostril three times a day as needed for runny nose/drainage. Return for allergy testing (1-55).   Assessment and Plan              No follow-ups on file.  No orders of the defined types were placed in this encounter.  Lab Orders  No laboratory test(s) ordered today    Diagnostics: Skin Testing: Environmental allergy panel. *** Results discussed with patient/family.   Previous notes and tests were reviewed. The plan was reviewed with the patient/family, and all questions/concerned were addressed.  It was my pleasure to see Lori Love today and participate in her care. Please feel free to contact me with any questions or concerns.  Sincerely,  Orlan Luke, DO Allergy & Immunology  [facility]  [facility] office: [phone] [facility] office: [phone]

## 2023-12-23 ENCOUNTER — Ambulatory Visit: Payer: Self-pay | Admitting: Family Medicine

## 2023-12-23 LAB — COLOGUARD: COLOGUARD: NEGATIVE

## 2023-12-30 NOTE — Progress Notes (Deleted)
   Skin testing note  RE: Lori Love MRN: [id number] DOB: 1960/08/31 Date of Office Visit: 12/31/2023  Referring provider: Micheal Wolm ORN, MD Primary care provider: Micheal Wolm ORN, MD  Chief Complaint: skin testing  History of Present Illness: I had the pleasure of seeing Lori Love for a skin testing visit at the [facility] on 12/30/2023. [name] a 63 y.o. female, who is being followed for asthma and allergic rhinitis. Her previous allergy office visit was on 12/03/2023 with Dr. Luke. Today is a skin testing visit.   Discussed the use of AI scribe software for clinical note transcription with the patient, who gave verbal consent to proceed.  History of Present Illness             *** Assessment and Plan: Lori Love is a 63 y.o. female with: Mild intermittent asthma without complication Asthma exacerbated by dog dander exposure, particularly from her dog with a skin condition. Symptoms include chronic cough and postnasal drip. No recent albuterol  use except during flu. No wheezing, chest tightness, or shortness of breath. 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  Saw ENT for this in the past. Today's spirometry was normal. May use levo/albuterol  rescue inhaler 2 puffs every 4 to 6 hours as needed for shortness of breath, chest tightness, coughing, and wheezing. Use with spacer.  Monitor frequency of use - if you need to use it more than twice per week on a consistent basis let us  know.    Other allergic rhinitis Current dog exacerbates her symptoms. 2008 skin testing positive to trees, weed, grass and borderline to mold. No prior AIT. Did not like the taste of azelastine .  Start environmental control measures as below. Use over the counter antihistamines such as Zyrtec (cetirizine), Claritin (loratadine), Allegra (fexofenadine), or Xyzal  (levocetirizine) daily as needed.  May switch antihistamines every few months. Hold 3 days before skin testing.   Use Atrovent  (ipratropium) 0.06% 1-2 sprays per nostril three times a day as needed for runny nose/drainage. Return for allergy testing (1-55).   Assessment and Plan              No follow-ups on file.  No orders of the defined types were placed in this encounter.  Lab Orders  No laboratory test(s) ordered today    Diagnostics: Skin Testing: Environmental allergy panel. *** Results discussed with patient/family.   Previous notes and tests were reviewed. The plan was reviewed with the patient/family, and all questions/concerned were addressed.  It was my pleasure to see Lori Love today and participate in her care. Please feel free to contact me with any questions or concerns.  Sincerely,  Orlan Luke, DO Allergy & Immunology  [facility]  [facility] office: [phone] [facility] office: [phone]

## 2023-12-31 ENCOUNTER — Ambulatory Visit: Admitting: Allergy

## 2024-01-02 ENCOUNTER — Ambulatory Visit: Attending: Cardiology | Admitting: Cardiology

## 2024-01-02 ENCOUNTER — Encounter: Payer: Self-pay | Admitting: Cardiology

## 2024-01-02 VITALS — BP 130/70 | HR 59 | Ht 65.0 in | Wt 155.0 lb

## 2024-01-02 DIAGNOSIS — I10 Essential (primary) hypertension: Secondary | ICD-10-CM | POA: Diagnosis not present

## 2024-01-02 DIAGNOSIS — R002 Palpitations: Secondary | ICD-10-CM | POA: Diagnosis not present

## 2024-01-02 MED ORDER — METOPROLOL SUCCINATE ER 25 MG PO TB24: 25.0000 mg | ORAL_TABLET | Freq: Every day | ORAL | 3 refills | Status: AC

## 2024-01-02 MED ORDER — VALSARTAN 80 MG PO TABS: 80.0000 mg | ORAL_TABLET | Freq: Every day | ORAL | 3 refills | Status: AC

## 2024-01-02 NOTE — Patient Instructions (Signed)

## 2024-02-27 ENCOUNTER — Encounter (HOSPITAL_BASED_OUTPATIENT_CLINIC_OR_DEPARTMENT_OTHER): Payer: Self-pay | Admitting: Obstetrics & Gynecology

## 2024-03-03 ENCOUNTER — Other Ambulatory Visit (HOSPITAL_BASED_OUTPATIENT_CLINIC_OR_DEPARTMENT_OTHER): Payer: Self-pay

## 2024-03-03 ENCOUNTER — Encounter (HOSPITAL_BASED_OUTPATIENT_CLINIC_OR_DEPARTMENT_OTHER): Payer: Self-pay

## 2024-03-03 ENCOUNTER — Ambulatory Visit (HOSPITAL_BASED_OUTPATIENT_CLINIC_OR_DEPARTMENT_OTHER)

## 2024-03-03 VITALS — BP 136/67 | HR 77 | Wt 162.0 lb

## 2024-03-03 DIAGNOSIS — R102 Pelvic and perineal pain unspecified side: Secondary | ICD-10-CM

## 2024-03-03 DIAGNOSIS — R3915 Urgency of urination: Secondary | ICD-10-CM | POA: Diagnosis not present

## 2024-03-03 DIAGNOSIS — R350 Frequency of micturition: Secondary | ICD-10-CM

## 2024-03-03 LAB — POCT URINALYSIS DIP (CLINITEK)
Bilirubin, UA: NEGATIVE
Blood, UA: NEGATIVE
Glucose, UA: NEGATIVE mg/dL
Ketones, POC UA: NEGATIVE mg/dL
Nitrite, UA: NEGATIVE
POC PROTEIN,UA: NEGATIVE
Spec Grav, UA: 1.015 (ref 1.010–1.025)
Urobilinogen, UA: 0.2 U/dL
pH, UA: 6 (ref 5.0–8.0)

## 2024-03-03 MED ORDER — PHENAZOPYRIDINE HCL 200 MG PO TABS: 200.0000 mg | ORAL_TABLET | Freq: Three times a day (TID) | ORAL | 0 refills | Status: AC | PRN

## 2024-03-03 MED ORDER — FOSFOMYCIN TROMETHAMINE 3 G PO PACK: 3.0000 g | PACK | Freq: Once | ORAL | Status: DC

## 2024-03-03 MED ORDER — FOSFOMYCIN TROMETHAMINE 3 G PO PACK: 3.0000 g | PACK | Freq: Once | ORAL | 0 refills | Status: AC

## 2024-03-03 NOTE — Progress Notes (Signed)
 NURSE VISIT- UTI SYMPTOMS   SUBJECTIVE:  Lori Love is a 63 y.o. [phone] female here for UTI symptoms. [name] a GYN patient. She reports urinary frequency, urinary urgency, and pressurex 10 days. Patient reports that she has been taking Macrobid  100 mg BID x 3 days that she had on hand to take following intercourse if needed. Symptoms have persisted.  OBJECTIVE:  BP 136/67 (BP Location: Left Arm, Patient Position: Sitting, Cuff Size: Normal)   Pulse 77   Wt 162 lb (73.5 kg)   LMP 09/14/2019   SpO2 100%   BMI 26.96 kg/m   Appears well, in no apparent distress  Results for orders placed or performed in visit on 03/03/24 (from the past 24 hours)  POCT URINALYSIS DIP (CLINITEK)   Collection Time: 03/03/24  3:16 PM  Result Value Ref Range   Color, UA yellow yellow   Clarity, UA clear clear   Glucose, UA negative negative mg/dL   Bilirubin, UA negative negative   Ketones, POC UA negative negative mg/dL   Spec Grav, UA 8.984 8.989 - 1.025   Blood, UA negative negative   pH, UA 6.0 5.0 - 8.0   POC PROTEIN,UA negative negative, trace   Urobilinogen, UA 0.2 0.2 or 1.0 E.U./dL   Nitrite, UA Negative Negative   Leukocytes, UA Trace (A) Negative    ASSESSMENT: GYN patient with UTI symptoms and negative nitrites  PLAN: Advised will review with Nidia Daring, NP and return call regarding recommendations for new medication if indicated at this time. Advised to discontinue taking Macrobid  at this time until I speak with [facility]. Rx sent today: Yes Urine culture sent Call or return to clinic prn if these symptoms worsen or fail to improve as anticipated. Follow-up: as needed

## 2024-03-03 NOTE — Progress Notes (Signed)
 Spoke with patient. Advised reviewed with Nidia Daring, NP who recommends that we start her on Fosfomycin  3 g x1. Advised this has been sent over to her pharmacy. She will need to mix the powder into an 8 oz glass of water and drink the entire glass. We will be in contact with her results from her urine culture as soon as it returns. Patient is agreeable.

## 2024-03-06 ENCOUNTER — Ambulatory Visit: Payer: Self-pay | Admitting: Obstetrics and Gynecology

## 2024-03-06 LAB — URINE CULTURE: Organism ID, Bacteria: NO GROWTH

## 2024-04-06 IMAGING — CT CT CARDIAC CORONARY ARTERY CALCIUM SCORE
3 series · 14 of 20 positions shown, 16 images · non-contrast
Comparison: None Available.
COMPARISON: None Available.

Addendum:
CLINICAL DATA: This over-read does not include interpretation of
cardiac or coronary anatomy or pathology. The calcium score
interpretation by the cardiologist is attached.
CLINICAL DATA: Cardiovascular Disease Risk stratification

EXAM:
Coronary Calcium Score
TECHNIQUE: A gated, non-contrast computed tomography scan of the heart was
performed using 3mm slice thickness. Axial images were analyzed on a
dedicated workstation. Calcium scoring of the coronary arteries was
performed using the Agatston method.

[Series 2: ax lung · axial · 0.73mm/px · z∈[-225,-135]mm · 5 of 69 slices shown]
[im 12/69  lung]
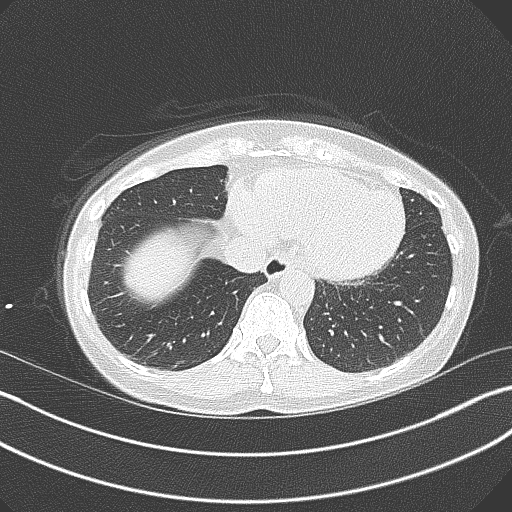
[im 23/69  lung]
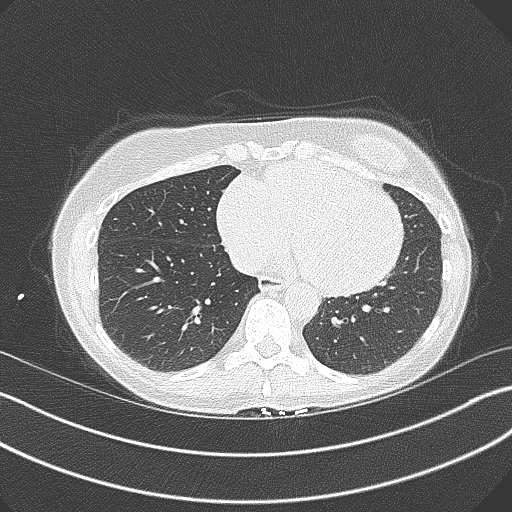
[im 35/69  lung]
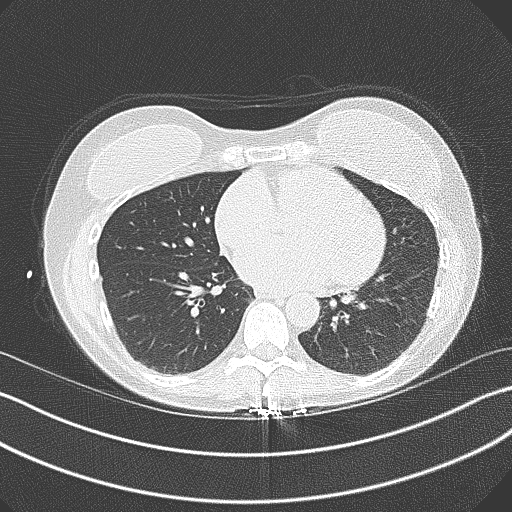
[im 46/69  lung]
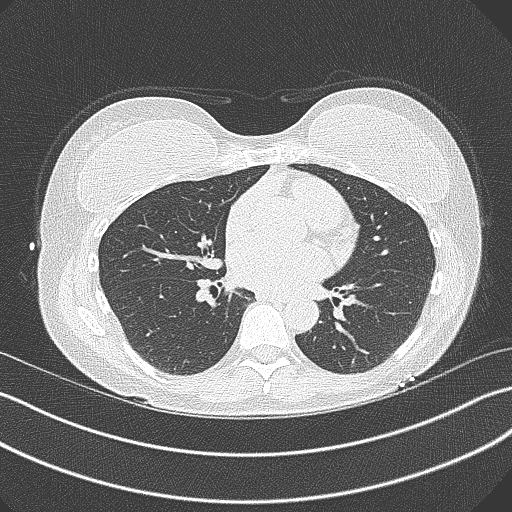
[im 57/69  lung]
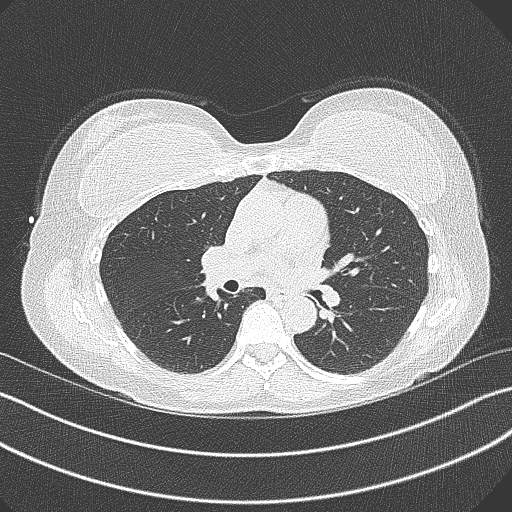

[Series 4: cascseq 3.0 sa36 70% (id) · axial · 0.39mm/px · z∈[-238,-154]mm · 3 of 57 slices shown]
[im 15/57  vessel]
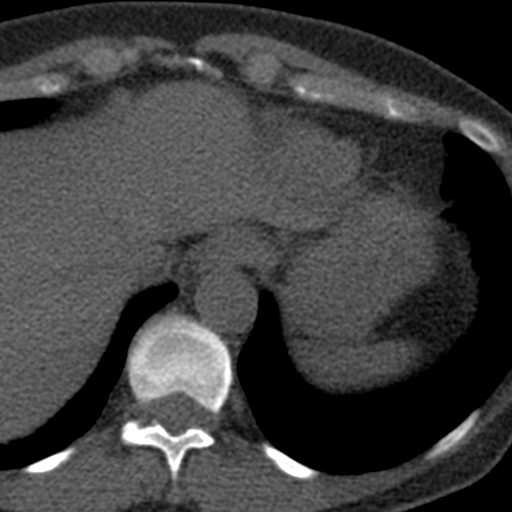
[im 29/57  vessel]
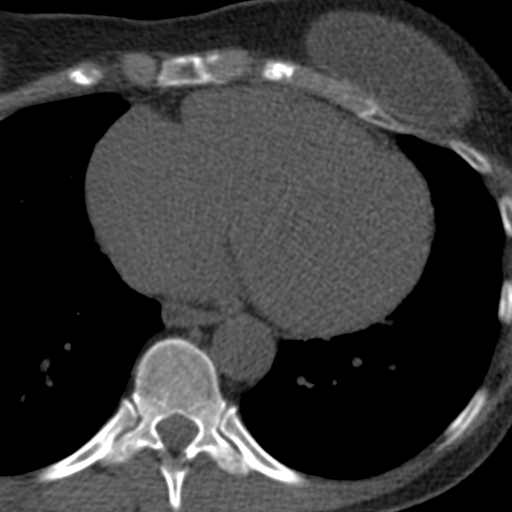
[im 43/57  vessel]
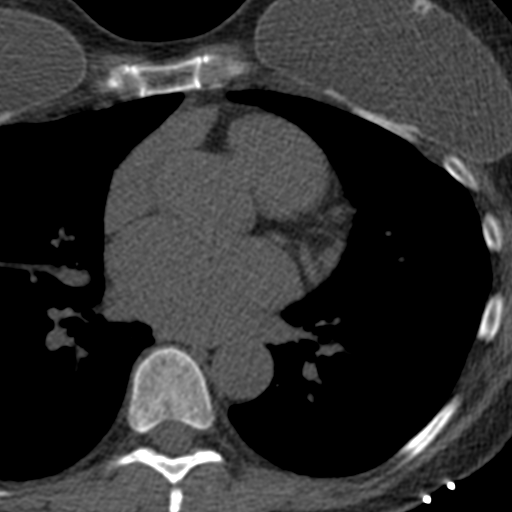

[Series 5: ax st · axial · 0.65mm/px · z∈[-258,-138]mm · 6 of 86 slices shown, 8 images]
[im 13/86  vessel]
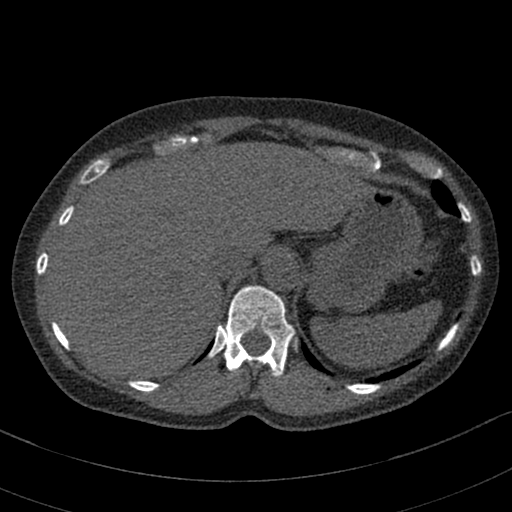
[im 13/86  lung]
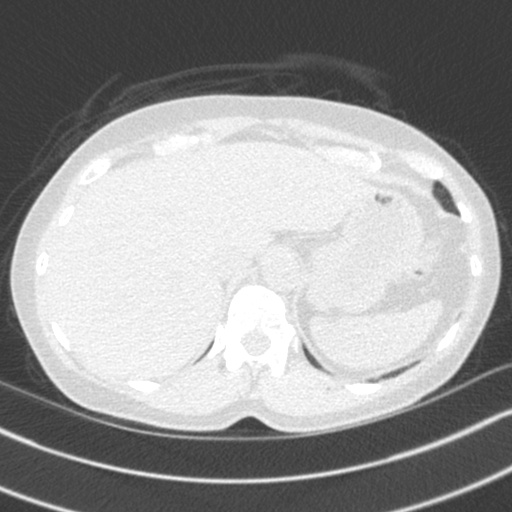
[im 25/86  vessel]
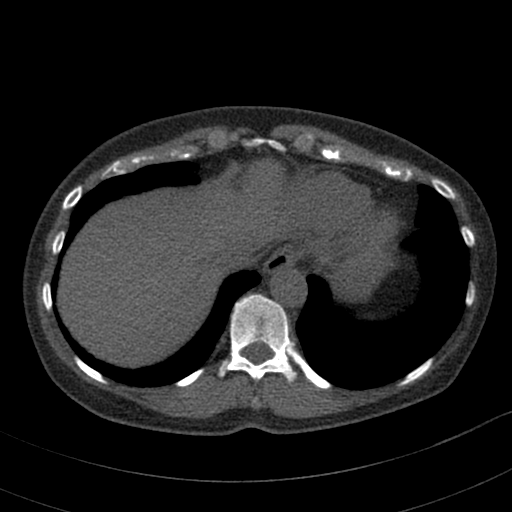
[im 37/86  vessel]
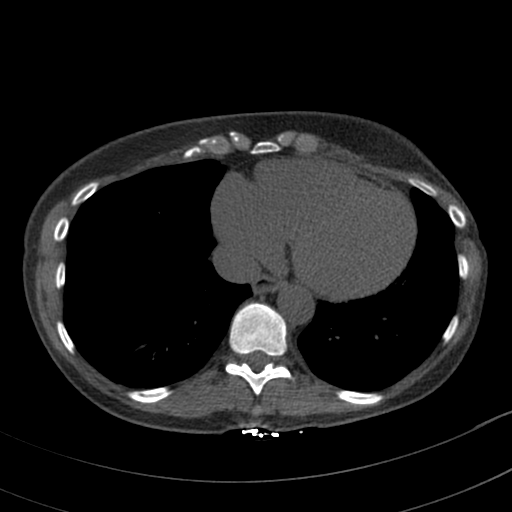
[im 49/86  vessel]
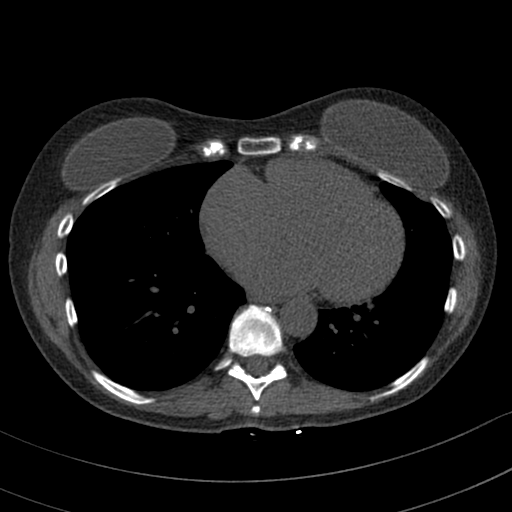
[im 61/86  vessel]
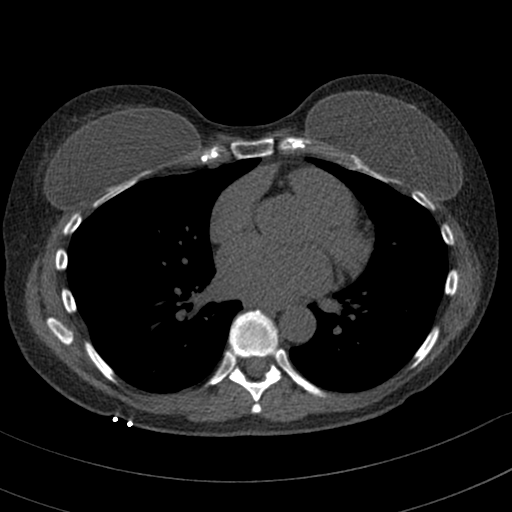
[im 61/86  lung]
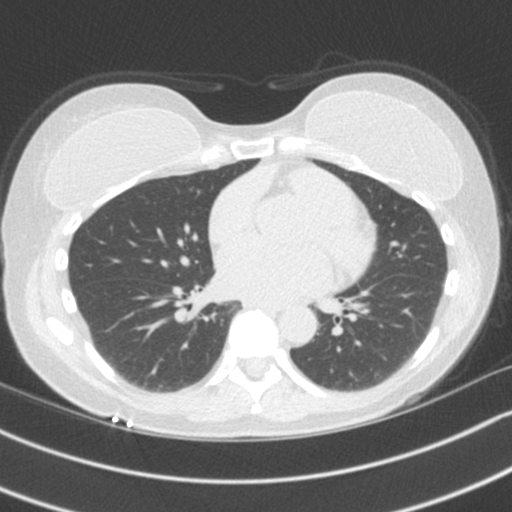
[im 73/86  vessel]
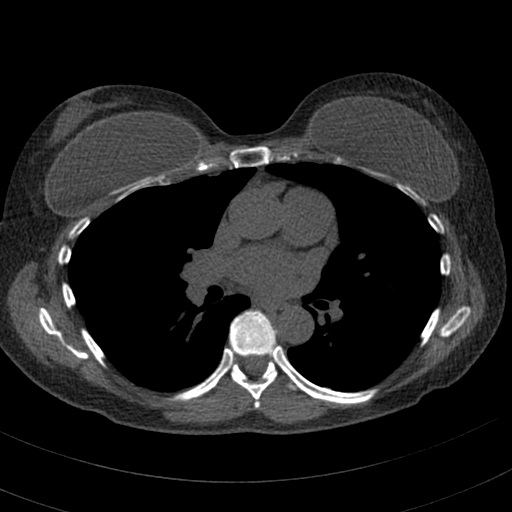

[14 of 20 positions shown; findings below may reference images not displayed]

FINDINGS: Vascular: Normal aortic caliber.

Mediastinum/Nodes: No imaged thoracic adenopathy.

Lungs/Pleura: No pleural fluid.  Clear imaged lungs.

Upper Abdomen: Normal imaged portions of the liver, spleen, stomach.

Musculoskeletal: Bilateral breast implants. No acute osseous
abnormality.
IMPRESSION: No acute findings in the imaged extracardiac chest.
FINDINGS: Coronary arteries: Normal origins.

Coronary Calcium Score:

Left main: 0

Left anterior descending artery: 0

Left circumflex artery: 0

Right coronary artery: 0

Total: 0

Percentile: 0

Pericardium: Normal.

Aorta: Normal caliber.

Non-cardiac: See separate report from [REDACTED].
IMPRESSION: Coronary calcium score of 0. This is a low risk study.



If CAC=0, it is reasonable to withhold statin therapy and reassess
in 5 to 10 years, as long as higher risk conditions are absent
(diabetes mellitus, family history of premature CHD in first degree
relatives (males <55 years; females <65 years), cigarette smoking,
or LDL >=190 mg/dL).

If CAC is 1 to 99, it is reasonable to initiate statin therapy for
patients >=55 years of age.

If CAC is >=100 or >=75th percentile, it is reasonable to initiate
statin therapy at any age.

Cardiology referral should be considered for patients with CAC
scores >=400 or >=75th percentile.

*7426 AHA/ACC/AACVPR/AAPA/ABC/MURGUIA/WEILAND/ERXLEBEN/Yodit/PERA/RISHI/CHINAT
Guideline on the Management of Blood Cholesterol: A Report of the
American College of Cardiology/American Heart Association Task Force
on Clinical Practice Guidelines. J Am Coll Cardiol.
5580;73(24):0808-0312.

*** End of Addendum ***
FINDINGS: Vascular: Normal aortic caliber.

Mediastinum/Nodes: No imaged thoracic adenopathy.

Lungs/Pleura: No pleural fluid.  Clear imaged lungs.

Upper Abdomen: Normal imaged portions of the liver, spleen, stomach.

Musculoskeletal: Bilateral breast implants. No acute osseous
abnormality.
IMPRESSION: No acute findings in the imaged extracardiac chest.
